# Patient Record
Sex: Female | Born: 1955
Health system: Southern US, Community
[De-identification: ages and names within clinical notes are randomized; demographics above are authoritative.]

## PROBLEM LIST (undated history)

## (undated) DIAGNOSIS — G839 Paralytic syndrome, unspecified: Secondary | ICD-10-CM

## (undated) DIAGNOSIS — I10 Essential (primary) hypertension: Secondary | ICD-10-CM

## (undated) DIAGNOSIS — M1990 Unspecified osteoarthritis, unspecified site: Secondary | ICD-10-CM

## (undated) DIAGNOSIS — L509 Urticaria, unspecified: Secondary | ICD-10-CM

## (undated) DIAGNOSIS — K219 Gastro-esophageal reflux disease without esophagitis: Secondary | ICD-10-CM

## (undated) HISTORY — DX: Unspecified osteoarthritis, unspecified site: M19.90

## (undated) HISTORY — DX: Essential (primary) hypertension: I10

## (undated) HISTORY — DX: Urticaria, unspecified: L50.9

## (undated) HISTORY — DX: Gastro-esophageal reflux disease without esophagitis: K21.9

## (undated) HISTORY — DX: Paralytic syndrome, unspecified: G83.9

---

## 1985-08-24 HISTORY — PX: ABDOMINAL HYSTERECTOMY: SHX81

## 1996-08-24 HISTORY — PX: OTHER SURGICAL HISTORY: SHX169

## 1998-11-06 ENCOUNTER — Ambulatory Visit (HOSPITAL_COMMUNITY): Admission: RE | Admit: 1998-11-06 | Discharge: 1998-11-06 | Payer: Self-pay | Admitting: Internal Medicine

## 1998-11-06 ENCOUNTER — Encounter: Payer: Self-pay | Admitting: Internal Medicine

## 1999-08-29 ENCOUNTER — Encounter: Admission: RE | Admit: 1999-08-29 | Discharge: 1999-08-29 | Payer: Self-pay | Admitting: Internal Medicine

## 1999-08-29 ENCOUNTER — Encounter: Payer: Self-pay | Admitting: Internal Medicine

## 1999-09-22 ENCOUNTER — Encounter: Admission: RE | Admit: 1999-09-22 | Discharge: 1999-09-22 | Payer: Self-pay | Admitting: Internal Medicine

## 1999-09-22 ENCOUNTER — Encounter: Payer: Self-pay | Admitting: Internal Medicine

## 1999-10-01 ENCOUNTER — Encounter: Admission: RE | Admit: 1999-10-01 | Discharge: 1999-10-01 | Payer: Self-pay | Admitting: Gynecology

## 1999-10-01 ENCOUNTER — Encounter: Payer: Self-pay | Admitting: Otolaryngology

## 1999-12-30 ENCOUNTER — Encounter: Payer: Self-pay | Admitting: Orthopedic Surgery

## 1999-12-30 ENCOUNTER — Ambulatory Visit (HOSPITAL_COMMUNITY): Admission: RE | Admit: 1999-12-30 | Discharge: 1999-12-30 | Payer: Self-pay | Admitting: Orthopedic Surgery

## 2000-02-17 ENCOUNTER — Encounter: Payer: Self-pay | Admitting: Orthopedic Surgery

## 2000-02-23 ENCOUNTER — Inpatient Hospital Stay (HOSPITAL_COMMUNITY): Admission: RE | Admit: 2000-02-23 | Discharge: 2000-02-24 | Payer: Self-pay | Admitting: Orthopedic Surgery

## 2000-02-23 ENCOUNTER — Encounter: Payer: Self-pay | Admitting: Orthopedic Surgery

## 2000-10-12 ENCOUNTER — Encounter: Payer: Self-pay | Admitting: Internal Medicine

## 2000-10-12 ENCOUNTER — Encounter: Admission: RE | Admit: 2000-10-12 | Discharge: 2000-10-12 | Payer: Self-pay | Admitting: Internal Medicine

## 2000-11-10 ENCOUNTER — Other Ambulatory Visit: Admission: RE | Admit: 2000-11-10 | Discharge: 2000-11-10 | Payer: Self-pay | Admitting: Internal Medicine

## 2001-08-26 ENCOUNTER — Observation Stay (HOSPITAL_COMMUNITY): Admission: RE | Admit: 2001-08-26 | Discharge: 2001-08-27 | Payer: Self-pay | Admitting: Obstetrics & Gynecology

## 2001-08-26 ENCOUNTER — Encounter (INDEPENDENT_AMBULATORY_CARE_PROVIDER_SITE_OTHER): Payer: Self-pay | Admitting: *Deleted

## 2001-08-26 ENCOUNTER — Encounter (INDEPENDENT_AMBULATORY_CARE_PROVIDER_SITE_OTHER): Payer: Self-pay

## 2001-10-13 ENCOUNTER — Encounter: Admission: RE | Admit: 2001-10-13 | Discharge: 2001-10-13 | Payer: Self-pay | Admitting: Internal Medicine

## 2001-10-13 ENCOUNTER — Encounter: Payer: Self-pay | Admitting: Internal Medicine

## 2002-06-12 ENCOUNTER — Encounter: Admission: RE | Admit: 2002-06-12 | Discharge: 2002-06-22 | Payer: Self-pay | Admitting: Orthopedic Surgery

## 2002-10-17 ENCOUNTER — Encounter: Payer: Self-pay | Admitting: Internal Medicine

## 2002-10-17 ENCOUNTER — Encounter: Admission: RE | Admit: 2002-10-17 | Discharge: 2002-10-17 | Payer: Self-pay | Admitting: Internal Medicine

## 2003-10-22 ENCOUNTER — Encounter: Admission: RE | Admit: 2003-10-22 | Discharge: 2003-10-22 | Payer: Self-pay | Admitting: Internal Medicine

## 2004-07-16 ENCOUNTER — Ambulatory Visit (HOSPITAL_COMMUNITY): Admission: RE | Admit: 2004-07-16 | Discharge: 2004-07-16 | Payer: Self-pay | Admitting: Ophthalmology

## 2004-07-21 ENCOUNTER — Ambulatory Visit: Payer: Self-pay | Admitting: Internal Medicine

## 2004-09-08 ENCOUNTER — Ambulatory Visit: Payer: Self-pay | Admitting: Internal Medicine

## 2004-11-10 ENCOUNTER — Ambulatory Visit: Payer: Self-pay | Admitting: Internal Medicine

## 2005-02-09 ENCOUNTER — Ambulatory Visit: Payer: Self-pay | Admitting: Internal Medicine

## 2005-02-10 ENCOUNTER — Ambulatory Visit: Payer: Self-pay | Admitting: Internal Medicine

## 2005-08-10 ENCOUNTER — Ambulatory Visit: Payer: Self-pay | Admitting: Internal Medicine

## 2005-11-09 ENCOUNTER — Ambulatory Visit: Payer: Self-pay | Admitting: Internal Medicine

## 2006-05-10 ENCOUNTER — Ambulatory Visit: Payer: Self-pay | Admitting: Internal Medicine

## 2006-06-10 ENCOUNTER — Ambulatory Visit: Payer: Self-pay | Admitting: Internal Medicine

## 2006-06-17 ENCOUNTER — Ambulatory Visit: Payer: Self-pay | Admitting: Internal Medicine

## 2006-08-02 ENCOUNTER — Ambulatory Visit: Payer: Self-pay | Admitting: Internal Medicine

## 2006-08-02 LAB — CONVERTED CEMR LAB
ALT: 40 units/L (ref 0–40)
AST: 29 units/L (ref 0–37)
Albumin: 3.7 g/dL (ref 3.5–5.2)
Alkaline Phosphatase: 110 units/L (ref 39–117)
BUN: 17 mg/dL (ref 6–23)
Bilirubin Urine: NEGATIVE
CO2: 31 meq/L (ref 19–32)
Calcium: 9.8 mg/dL (ref 8.4–10.5)
Chloride: 102 meq/L (ref 96–112)
Chol/HDL Ratio, serum: 5.1
Cholesterol: 192 mg/dL (ref 0–200)
Creatinine, Ser: 0.9 mg/dL (ref 0.4–1.2)
GFR calc non Af Amer: 70 mL/min
Glomerular Filtration Rate, Af Am: 85 mL/min/{1.73_m2}
Glucose, Bld: 125 mg/dL — ABNORMAL HIGH (ref 70–99)
HDL: 37.4 mg/dL — ABNORMAL LOW (ref >39.0)
Hemoglobin, Urine: NEGATIVE
Hemoglobin: 12.6 g/dL (ref 12.0–15.0)
Ketones, ur: NEGATIVE mg/dL
LDL Cholesterol: 139 mg/dL — ABNORMAL HIGH (ref 0–99)
Leukocytes, UA: NEGATIVE
Nitrite: NEGATIVE
Potassium: 3.9 meq/L (ref 3.5–5.1)
Sodium: 142 meq/L (ref 135–145)
Specific Gravity, Urine: >=1.03 (ref 1.000–1.03)
TSH: 2.43 microintl units/mL (ref 0.35–5.50)
Total Bilirubin: 0.6 mg/dL (ref 0.3–1.2)
Total Protein, Urine: NEGATIVE mg/dL
Total Protein: 7.4 g/dL (ref 6.0–8.3)
Triglyceride fasting, serum: 77 mg/dL (ref 0–149)
Urine Glucose: NEGATIVE mg/dL
Urobilinogen, UA: 0.2 (ref 0.0–1.0)
VLDL: 15 mg/dL (ref 0–40)
pH: 5 (ref 5.0–8.0)

## 2006-08-09 ENCOUNTER — Ambulatory Visit: Payer: Self-pay | Admitting: Internal Medicine

## 2006-11-08 ENCOUNTER — Ambulatory Visit: Payer: Self-pay | Admitting: Internal Medicine

## 2007-02-28 ENCOUNTER — Ambulatory Visit: Payer: Self-pay | Admitting: Internal Medicine

## 2007-02-28 LAB — CONVERTED CEMR LAB
BUN: 13 mg/dL (ref 6–23)
CO2: 33 meq/L — ABNORMAL HIGH (ref 19–32)
Calcium: 9.7 mg/dL (ref 8.4–10.5)
Chloride: 108 meq/L (ref 96–112)
Creatinine, Ser: 0.8 mg/dL (ref 0.4–1.2)
GFR calc Af Amer: 97 mL/min
GFR calc non Af Amer: 80 mL/min
Glucose, Bld: 131 mg/dL — ABNORMAL HIGH (ref 70–99)
Hgb A1c MFr Bld: 7.1 % — ABNORMAL HIGH (ref 4.6–6.0)
Potassium: 3.8 meq/L (ref 3.5–5.1)
Sodium: 144 meq/L (ref 135–145)
Vit D, 1,25-Dihydroxy: 35 (ref 20–57)

## 2007-03-07 ENCOUNTER — Ambulatory Visit: Payer: Self-pay | Admitting: Internal Medicine

## 2007-06-15 ENCOUNTER — Encounter: Admission: RE | Admit: 2007-06-15 | Discharge: 2007-06-15 | Payer: Self-pay | Admitting: Sports Medicine

## 2007-06-20 ENCOUNTER — Encounter: Payer: Self-pay | Admitting: Internal Medicine

## 2007-06-20 DIAGNOSIS — E119 Type 2 diabetes mellitus without complications: Secondary | ICD-10-CM | POA: Insufficient documentation

## 2007-07-01 ENCOUNTER — Encounter: Payer: Self-pay | Admitting: Internal Medicine

## 2007-07-04 ENCOUNTER — Ambulatory Visit: Payer: Self-pay | Admitting: Internal Medicine

## 2007-07-04 LAB — CONVERTED CEMR LAB
BUN: 6 mg/dL
CO2: 30 meq/L
Calcium: 9.7 mg/dL
Chloride: 104 meq/L
Creatinine, Ser: 0.7 mg/dL
GFR calc Af Amer: 113 mL/min
GFR calc non Af Amer: 93 mL/min
Glucose, Bld: 112 mg/dL — ABNORMAL HIGH
Hgb A1c MFr Bld: 6.8 % — ABNORMAL HIGH
Potassium: 3.9 meq/L
Sodium: 142 meq/L

## 2007-07-13 ENCOUNTER — Ambulatory Visit: Payer: Self-pay | Admitting: Internal Medicine

## 2007-07-13 DIAGNOSIS — G4700 Insomnia, unspecified: Secondary | ICD-10-CM | POA: Insufficient documentation

## 2007-07-13 DIAGNOSIS — I10 Essential (primary) hypertension: Secondary | ICD-10-CM | POA: Insufficient documentation

## 2007-07-13 DIAGNOSIS — M25519 Pain in unspecified shoulder: Secondary | ICD-10-CM

## 2007-08-25 DIAGNOSIS — M1990 Unspecified osteoarthritis, unspecified site: Secondary | ICD-10-CM

## 2007-08-25 HISTORY — DX: Unspecified osteoarthritis, unspecified site: M19.90

## 2007-11-14 ENCOUNTER — Ambulatory Visit: Payer: Self-pay | Admitting: Internal Medicine

## 2007-11-14 LAB — CONVERTED CEMR LAB
ALT: 26 units/L (ref 0–35)
AST: 20 units/L (ref 0–37)
Albumin: 4.2 g/dL (ref 3.5–5.2)
Alkaline Phosphatase: 85 units/L (ref 39–117)
BUN: 12 mg/dL (ref 6–23)
Bilirubin, Direct: 0.1 mg/dL (ref 0.0–0.3)
CO2: 30 meq/L (ref 19–32)
Calcium: 9.7 mg/dL (ref 8.4–10.5)
Chloride: 105 meq/L (ref 96–112)
Cholesterol: 163 mg/dL (ref 0–200)
Creatinine, Ser: 0.7 mg/dL (ref 0.4–1.2)
GFR calc Af Amer: 113 mL/min
GFR calc non Af Amer: 93 mL/min
Glucose, Bld: 108 mg/dL — ABNORMAL HIGH (ref 70–99)
HDL: 33.4 mg/dL — ABNORMAL LOW (ref >39.0)
Hgb A1c MFr Bld: 6.6 % — ABNORMAL HIGH (ref 4.6–6.0)
LDL Cholesterol: 118 mg/dL — ABNORMAL HIGH (ref 0–99)
Potassium: 3.9 meq/L (ref 3.5–5.1)
Sodium: 143 meq/L (ref 135–145)
Total Bilirubin: 0.6 mg/dL (ref 0.3–1.2)
Total CHOL/HDL Ratio: 4.9
Total Protein: 7.5 g/dL (ref 6.0–8.3)
Triglycerides: 56 mg/dL (ref 0–149)
VLDL: 11 mg/dL (ref 0–40)

## 2007-11-23 ENCOUNTER — Ambulatory Visit: Payer: Self-pay | Admitting: Internal Medicine

## 2007-11-23 DIAGNOSIS — M79609 Pain in unspecified limb: Secondary | ICD-10-CM | POA: Insufficient documentation

## 2008-03-19 ENCOUNTER — Ambulatory Visit: Payer: Self-pay | Admitting: Internal Medicine

## 2008-03-20 LAB — CONVERTED CEMR LAB
BUN: 7 mg/dL (ref 6–23)
CO2: 31 meq/L (ref 19–32)
Calcium: 9.6 mg/dL (ref 8.4–10.5)
Chloride: 103 meq/L (ref 96–112)
Creatinine, Ser: 0.7 mg/dL (ref 0.4–1.2)
GFR calc Af Amer: 113 mL/min
GFR calc non Af Amer: 93 mL/min
Glucose, Bld: 120 mg/dL — ABNORMAL HIGH (ref 70–99)
Hgb A1c MFr Bld: 6.9 % — ABNORMAL HIGH (ref 4.6–6.0)
Potassium: 4.3 meq/L (ref 3.5–5.1)
Sodium: 145 meq/L (ref 135–145)

## 2008-03-27 ENCOUNTER — Ambulatory Visit: Payer: Self-pay | Admitting: Internal Medicine

## 2008-03-27 DIAGNOSIS — M1990 Unspecified osteoarthritis, unspecified site: Secondary | ICD-10-CM | POA: Insufficient documentation

## 2008-07-16 ENCOUNTER — Ambulatory Visit: Payer: Self-pay | Admitting: Internal Medicine

## 2008-07-16 LAB — CONVERTED CEMR LAB
BUN: 7 mg/dL (ref 6–23)
CO2: 29 meq/L (ref 19–32)
Calcium: 9.6 mg/dL (ref 8.4–10.5)
Chloride: 103 meq/L (ref 96–112)
Creatinine, Ser: 0.6 mg/dL (ref 0.4–1.2)
GFR calc Af Amer: 134 mL/min
GFR calc non Af Amer: 111 mL/min
Glucose, Bld: 98 mg/dL (ref 70–99)
Hgb A1c MFr Bld: 6.8 % — ABNORMAL HIGH (ref 4.6–6.0)
Potassium: 4.1 meq/L (ref 3.5–5.1)
Sodium: 142 meq/L (ref 135–145)

## 2008-07-26 ENCOUNTER — Ambulatory Visit: Payer: Self-pay | Admitting: Internal Medicine

## 2009-01-11 ENCOUNTER — Ambulatory Visit: Payer: Self-pay | Admitting: Internal Medicine

## 2009-01-11 LAB — CONVERTED CEMR LAB
BUN: 10 mg/dL (ref 6–23)
CO2: 30 meq/L (ref 19–32)
Calcium: 9.8 mg/dL (ref 8.4–10.5)
Chloride: 109 meq/L (ref 96–112)
Creatinine, Ser: 0.7 mg/dL (ref 0.4–1.2)
GFR calc non Af Amer: 111.98 mL/min (ref >60)
Glucose, Bld: 99 mg/dL (ref 70–99)
Hgb A1c MFr Bld: 6.7 % — ABNORMAL HIGH (ref 4.6–6.5)
Potassium: 4.3 meq/L (ref 3.5–5.1)
Sodium: 146 meq/L — ABNORMAL HIGH (ref 135–145)

## 2009-01-24 ENCOUNTER — Ambulatory Visit: Payer: Self-pay | Admitting: Internal Medicine

## 2009-01-24 DIAGNOSIS — J039 Acute tonsillitis, unspecified: Secondary | ICD-10-CM

## 2009-01-24 DIAGNOSIS — M542 Cervicalgia: Secondary | ICD-10-CM

## 2009-03-12 ENCOUNTER — Encounter: Admission: RE | Admit: 2009-03-12 | Discharge: 2009-03-12 | Payer: Self-pay | Admitting: Internal Medicine

## 2009-05-20 ENCOUNTER — Ambulatory Visit: Payer: Self-pay | Admitting: Internal Medicine

## 2009-05-20 LAB — CONVERTED CEMR LAB
BUN: 8 mg/dL (ref 6–23)
CO2: 30 meq/L (ref 19–32)
Calcium: 9.5 mg/dL (ref 8.4–10.5)
Chloride: 107 meq/L (ref 96–112)
Creatinine, Ser: 0.6 mg/dL (ref 0.4–1.2)
GFR calc non Af Amer: 133.6 mL/min (ref >60)
Glucose, Bld: 94 mg/dL (ref 70–99)
Hgb A1c MFr Bld: 6.7 % — ABNORMAL HIGH (ref 4.6–6.5)
Potassium: 4 meq/L (ref 3.5–5.1)
Sodium: 143 meq/L (ref 135–145)

## 2009-05-28 ENCOUNTER — Ambulatory Visit: Payer: Self-pay | Admitting: Internal Medicine

## 2009-11-18 ENCOUNTER — Ambulatory Visit: Payer: Self-pay | Admitting: Internal Medicine

## 2009-11-18 LAB — CONVERTED CEMR LAB
ALT: 27 units/L (ref 0–35)
AST: 23 units/L (ref 0–37)
Albumin: 4.1 g/dL (ref 3.5–5.2)
Alkaline Phosphatase: 107 units/L (ref 39–117)
BUN: 7 mg/dL (ref 6–23)
Bilirubin, Direct: 0.1 mg/dL (ref 0.0–0.3)
CO2: 31 meq/L (ref 19–32)
Calcium: 9.7 mg/dL (ref 8.4–10.5)
Chloride: 104 meq/L (ref 96–112)
Cholesterol: 174 mg/dL (ref 0–200)
Creatinine, Ser: 0.7 mg/dL (ref 0.4–1.2)
GFR calc non Af Amer: 111.63 mL/min (ref >60)
Glucose, Bld: 92 mg/dL (ref 70–99)
HDL: 39.2 mg/dL (ref >39.00)
Hgb A1c MFr Bld: 6.6 % — ABNORMAL HIGH (ref 4.6–6.5)
LDL Cholesterol: 113 mg/dL — ABNORMAL HIGH (ref 0–99)
Potassium: 4.4 meq/L (ref 3.5–5.1)
Sodium: 143 meq/L (ref 135–145)
TSH: 1.48 microintl units/mL (ref 0.35–5.50)
Total Bilirubin: 0.4 mg/dL (ref 0.3–1.2)
Total CHOL/HDL Ratio: 4
Total Protein: 8.4 g/dL — ABNORMAL HIGH (ref 6.0–8.3)
Triglycerides: 111 mg/dL (ref 0.0–149.0)
VLDL: 22.2 mg/dL (ref 0.0–40.0)

## 2009-11-28 ENCOUNTER — Ambulatory Visit: Payer: Self-pay | Admitting: Internal Medicine

## 2009-11-28 DIAGNOSIS — J069 Acute upper respiratory infection, unspecified: Secondary | ICD-10-CM | POA: Insufficient documentation

## 2009-11-28 DIAGNOSIS — R209 Unspecified disturbances of skin sensation: Secondary | ICD-10-CM

## 2009-11-28 HISTORY — DX: Acute upper respiratory infection, unspecified: J06.9

## 2010-02-17 ENCOUNTER — Ambulatory Visit: Payer: Self-pay | Admitting: Internal Medicine

## 2010-02-17 LAB — CONVERTED CEMR LAB
BUN: 15 mg/dL (ref 6–23)
CO2: 29 meq/L (ref 19–32)
Calcium: 9.8 mg/dL (ref 8.4–10.5)
Chloride: 105 meq/L (ref 96–112)
Creatinine, Ser: 0.7 mg/dL (ref 0.4–1.2)
GFR calc non Af Amer: 104.6 mL/min (ref >60)
Glucose, Bld: 91 mg/dL (ref 70–99)
Hgb A1c MFr Bld: 6.8 % — ABNORMAL HIGH (ref 4.6–6.5)
Potassium: 5.1 meq/L (ref 3.5–5.1)
Sodium: 142 meq/L (ref 135–145)
Vit D, 25-Hydroxy: 46 ng/mL (ref 30–89)
Vitamin B-12: 614 pg/mL (ref 211–911)

## 2010-02-27 ENCOUNTER — Ambulatory Visit: Payer: Self-pay | Admitting: Internal Medicine

## 2010-03-14 ENCOUNTER — Encounter: Admission: RE | Admit: 2010-03-14 | Discharge: 2010-03-14 | Payer: Self-pay | Admitting: Internal Medicine

## 2010-03-14 LAB — HM MAMMOGRAPHY

## 2010-07-08 ENCOUNTER — Ambulatory Visit: Payer: Self-pay | Admitting: Internal Medicine

## 2010-07-09 LAB — CONVERTED CEMR LAB
ALT: 32 units/L (ref 0–35)
AST: 24 units/L (ref 0–37)
Albumin: 4.3 g/dL (ref 3.5–5.2)
Alkaline Phosphatase: 105 units/L (ref 39–117)
BUN: 12 mg/dL (ref 6–23)
Basophils Absolute: 0.1 10*3/uL (ref 0.0–0.1)
Basophils Relative: 1 % (ref 0.0–3.0)
Bilirubin Urine: NEGATIVE
Bilirubin, Direct: 0 mg/dL (ref 0.0–0.3)
CO2: 30 meq/L (ref 19–32)
Calcium: 9.8 mg/dL (ref 8.4–10.5)
Chloride: 103 meq/L (ref 96–112)
Cholesterol: 170 mg/dL (ref 0–200)
Creatinine, Ser: 0.8 mg/dL (ref 0.4–1.2)
Eosinophils Absolute: 0.2 10*3/uL (ref 0.0–0.7)
Eosinophils Relative: 2.9 % (ref 0.0–5.0)
GFR calc non Af Amer: 90.24 mL/min (ref >60)
Glucose, Bld: 98 mg/dL (ref 70–99)
HCT: 39.4 % (ref 36.0–46.0)
HDL: 41.4 mg/dL (ref >39.00)
Hemoglobin, Urine: NEGATIVE
Hemoglobin: 13.1 g/dL (ref 12.0–15.0)
Hgb A1c MFr Bld: 6.8 % — ABNORMAL HIGH (ref 4.6–6.5)
Ketones, ur: NEGATIVE mg/dL
LDL Cholesterol: 117 mg/dL — ABNORMAL HIGH (ref 0–99)
Leukocytes, UA: NEGATIVE
Lymphocytes Relative: 53.2 % — ABNORMAL HIGH (ref 12.0–46.0)
Lymphs Abs: 3.2 10*3/uL (ref 0.7–4.0)
MCHC: 33.4 g/dL (ref 30.0–36.0)
MCV: 90.3 fL (ref 78.0–100.0)
Monocytes Absolute: 0.4 10*3/uL (ref 0.1–1.0)
Monocytes Relative: 7.2 % (ref 3.0–12.0)
Neutro Abs: 2.1 10*3/uL (ref 1.4–7.7)
Neutrophils Relative %: 35.7 % — ABNORMAL LOW (ref 43.0–77.0)
Nitrite: NEGATIVE
Platelets: 323 10*3/uL (ref 150.0–400.0)
Potassium: 5.3 meq/L — ABNORMAL HIGH (ref 3.5–5.1)
RBC: 4.36 M/uL (ref 3.87–5.11)
RDW: 13.9 % (ref 11.5–14.6)
Sodium: 140 meq/L (ref 135–145)
Specific Gravity, Urine: <=1.005 (ref 1.000–1.030)
TSH: 1.04 microintl units/mL (ref 0.35–5.50)
Total Bilirubin: 0.5 mg/dL (ref 0.3–1.2)
Total CHOL/HDL Ratio: 4
Total Protein, Urine: NEGATIVE mg/dL
Total Protein: 7.3 g/dL (ref 6.0–8.3)
Triglycerides: 60 mg/dL (ref 0.0–149.0)
Urine Glucose: NEGATIVE mg/dL
Urobilinogen, UA: 0.2 (ref 0.0–1.0)
VLDL: 12 mg/dL (ref 0.0–40.0)
WBC: 6 10*3/uL (ref 4.5–10.5)
pH: 6.5 (ref 5.0–8.0)

## 2010-07-16 ENCOUNTER — Ambulatory Visit: Payer: Self-pay | Admitting: Internal Medicine

## 2010-07-16 DIAGNOSIS — K219 Gastro-esophageal reflux disease without esophagitis: Secondary | ICD-10-CM | POA: Insufficient documentation

## 2010-07-16 DIAGNOSIS — R071 Chest pain on breathing: Secondary | ICD-10-CM

## 2010-07-16 DIAGNOSIS — M25569 Pain in unspecified knee: Secondary | ICD-10-CM

## 2010-09-23 NOTE — Assessment & Plan Note (Signed)
Summary: 3 MTH FU STC   Vital Signs:  Patient profile:   55 year old female Height:      58 inches (147.32 cm) Weight:      149 pounds (67.73 kg) BMI:     31.25 O2 Sat:      98 % on Room air Temp:     98.6 degrees F (37.00 degrees C) oral Pulse rate:   80 / minute Pulse rhythm:   regular Resp:     16 per minute BP sitting:   130 / 90  (left arm)  Vitals Entered By: Lanier Prude, CMA(AAMA) (February 27, 2010 8:13 AM)  O2 Flow:  Room air CC: 3 mo f/u Is Patient Diabetic? Yes Did you bring your meter with you today? No   CC:  3 mo f/u.  History of Present Illness: The patient presents for a follow up of hypertension, diabetes, hyperlipidemia   Preventive Screening-Counseling & Management  Alcohol-Tobacco     Smoking Status: quit  Current Medications (verified): 1)  Norvasc 5 Mg  Tabs (Amlodipine Besylate) .... Once Daily 2)  Metformin Hcl 500 Mg  Tabs (Metformin Hcl) .... Once Daily 3)  Vitamin D3 1000 Unit  Tabs (Cholecalciferol) .[name].. 1 Qd 4)  Ibuprofen 600 Mg  Tabs (Ibuprofen) .[name].. 1 By Mouth Two Times A Day X 5 D Then As Needed Pain 5)  Zolpidem Tartrate 10 Mg Tabs (Zolpidem Tartrate) .... 1/2-1 Tab At Bedtime As Needed Insomnia 6)  Ranitidine Hcl 150 Mg Tabs (Ranitidine Hcl) .[name].. 1 By Mouth Two Times A Day For Indigestion  Allergies (verified): 1)  ! Maxzide-25 (Triamterene-Hctz) 2)  ! Atenolol (Atenolol) 3)  ! 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    Physical  Exam  General:  Well-developed,well-nourished,in no acute distress; alert,appropriate and cooperative throughout examination Mouth:  Erythematous throat mucosa and intranasal erythema.  Neck:  Cervical spine is tender to palpation over L paraspinal muscles and with the ROM  Lungs:  Normal respiratory effort, chest expands symmetrically. Lungs are clear to auscultation, no crackles or wheezes. Heart:  Normal rate and regular rhythm. S1 and S2 normal without gallop, murmur, click, rub or other extra sounds. Abdomen:  Bowel sounds positive,abdomen soft and non-tender without masses, organomegaly or hernias noted. Msk:  as above Neurologic:  No cranial nerve deficits noted. Station and gait are normal. Plantar reflexes are down-going bilaterally. DTRs are symmetrical throughout. Sensory, motor and coordinative functions appear intact. Tinnel's neg B Skin:  Intact without suspicious lesions or rashes Psych:  Cognition and judgment appear intact. Alert and cooperative with normal attention span and concentration. No apparent delusions, illusions, hallucinations   Impression & Recommendations:  Problem # 1:  DIABETES MELLITUS, TYPE II (ICD-250.00) Assessment Improved  Her updated medication list for this problem includes:    Metformin Hcl 500 Mg Tabs (Metformin hcl) ..... Once daily  Labs Reviewed: Creat: 0.7 (02/17/2010)    Reviewed HgBA1c results: 6.8 (02/17/2010)  6.6 (11/18/2009)  Problem # 2:  OSTEOARTHRITIS (ICD-715.90) Assessment: Unchanged  Her  updated medication list for this problem includes:    Ibuprofen 600 Mg Tabs (Ibuprofen) .[name]... 1 by mouth two times a day x 5 d then as needed pain  Problem # 3:  HYPERTENSION (ICD-401.1) Assessment: Unchanged  Her updated medication list for this problem includes:    Norvasc 5 Mg Tabs (Amlodipine besylate) ..... Once daily  Complete Medication List: 1)  Norvasc 5 Mg Tabs (Amlodipine besylate) .... Once daily 2)  Metformin Hcl 500 Mg  Tabs (Metformin hcl) .... Once daily 3)  Vitamin D3 1000 Unit Tabs (Cholecalciferol) .[name].. 1 qd 4)  Ibuprofen 600 Mg Tabs (Ibuprofen) .[name].. 1 by mouth two times a day x 5 d then as needed pain 5)  Zolpidem Tartrate 10 Mg Tabs (Zolpidem tartrate) .... 1/2-1 tab at bedtime as needed insomnia 6)  Ranitidine Hcl 150 Mg Tabs (Ranitidine hcl) .[name].. 1 by mouth two times a day for indigestion  Patient Instructions: 1)  Please schedule a follow-up appointment in 4 months well w/labs v70.0. 2)  HbgA1C prior to visit, ICD-9:250.00

## 2010-09-23 NOTE — Assessment & Plan Note (Signed)
Summary: 6 MO ROV /NWS  #   Vital Signs:  Patient profile:   55 year old female Height:      58 inches (147.32 cm) Weight:      151.0 pounds (68.64 kg) BMI:     31.67 O2 Sat:      96 % on Room air Temp:     98.1 degrees F (36.72 degrees C) oral Pulse rate:   86 / minute BP sitting:   110 / 82  (left arm) Cuff size:   regular  Vitals Entered By: Orlan Leavens (November 28, 2009 9:52 AM)  O2 Flow:  Room air CC: 6 month follow-up Is Patient Diabetic? Yes Did you bring your meter with you today? No Pain Assessment Patient in pain? no        CC:  6 month follow-up.  History of Present Illness: The patient presents with complaints of sore throat, fever, cough, sinus congestion and drainge of several days duration. Not better with OTC meds. Chest hurts with coughing. Can't sleep due to cough. Muscle aches are present.  The mucus is colored. C/o R arms w/pins and needles; legs cramp; not sleeping well  Current Medications (verified): 1)  Norvasc 5 Mg  Tabs (Amlodipine Besylate) .... Once Daily 2)  Metformin Hcl 500 Mg  Tabs (Metformin Hcl) .... Once Daily 3)  Vitamin D3 1000 Unit  Tabs (Cholecalciferol) .[name].. 1 Qd 4)  Ibuprofen 600 Mg  Tabs (Ibuprofen) .[name].. 1 By Mouth Two Times A Day X 5 D Then As Needed Pain  Allergies (verified): 1)  ! Maxzide-25 (Triamterene-Hctz) 2)  ! Atenolol (Atenolol) 3)  ! 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    Physical Exam  General:  Well-developed,well-nourished,in no acute distress; alert,appropriate and cooperative throughout examination Mouth:  Erythematous throat mucosa and intranasal  erythema.  Lungs:  Normal respiratory effort, chest expands symmetrically. Lungs are clear to auscultation, no crackles or wheezes. Heart:  Normal rate and regular rhythm. S1 and S2 normal without gallop, murmur, click, rub or other extra sounds. Abdomen:  Bowel sounds positive,abdomen soft and non-tender without masses, organomegaly or hernias noted. Msk:  as above Pulses:  R and L carotid,radial,femoral,dorsalis pedis and posterior tibial pulses are full and equal bilaterally Extremities:  No clubbing, cyanosis, edema, or deformity noted with normal full range of motion of all joints.   Neurologic:  No cranial nerve deficits noted. Station and gait are normal. Plantar reflexes are down-going bilaterally. DTRs are symmetrical throughout. Sensory, motor and coordinative functions appear intact. Tinnel's neg B Skin:  Intact without suspicious lesions or rashes Psych:  Cognition and judgment appear intact. Alert and cooperative with normal attention span and concentration. No apparent delusions, illusions, hallucinations   Impression & Recommendations:  Problem # 1:  DIABETES MELLITUS, TYPE II (ICD-250.00) Assessment Improved  Her updated medication list for this problem includes:    Metformin Hcl 500 Mg Tabs (Metformin hcl) ..... Once daily  Problem # 2:  INSOMNIA, PERSISTENT (ICD-307.42) Assessment: Deteriorated  Problem # 3:  OSTEOARTHRITIS (ICD-715.90)  The following medications were removed from the medication list:    Darvocet-n 100 100-650 Mg Tabs (Propoxyphene n-apap) .[name]... 1 by mouth two  times a day as needed pain Her updated medication list for this problem includes:    Ibuprofen 600 Mg Tabs (Ibuprofen) .[name]... 1 by mouth two times a day x 5 d then as needed pain  Discussed use of medications, application of heat or cold, and exercises.   Problem # 4:  HYPERTENSION (ICD-401.1) Assessment: Improved  Her updated medication list for this problem includes:    Norvasc 5 Mg Tabs  (Amlodipine besylate) ..... Once daily  BP today: 110/82 Prior BP: 126/84 (05/28/2009)  Labs Reviewed: K+: 4.4 (11/18/2009) Creat: : 0.7 (11/18/2009)   Chol: 174 (11/18/2009)   HDL: 39.20 (11/18/2009)   LDL: 113 (11/18/2009)   TG: 111.0 (11/18/2009)  Problem # 5:  UPPER RESPIRATORY INFECTION, ACUTE (ICD-465.9) Assessment: New  Her updated medication list for this problem includes:    Ibuprofen 600 Mg Tabs (Ibuprofen) .[name]... 1 by mouth two times a day x 5 d then as needed pain Zpac if worse  Problem # 6:  PARESTHESIA (ICD-782.0) Assessment: New The labs were reviewed with the patient.   Treat DM. Get more  labs  Complete Medication List: 1)  Norvasc 5 Mg Tabs (Amlodipine besylate) .... Once daily 2)  Metformin Hcl 500 Mg Tabs (Metformin hcl) .... Once daily 3)  Vitamin D3 1000 Unit Tabs (Cholecalciferol) .[name].. 1 qd 4)  Ibuprofen 600 Mg Tabs (Ibuprofen) .[name].. 1 by mouth two times a day x 5 d then as needed pain 5)  Zolpidem Tartrate 10 Mg Tabs (Zolpidem tartrate) .... 1/2-1 tab at bedtime as needed insomnia 6)  Ranitidine Hcl 150 Mg Tabs (Ranitidine hcl) .[name].. 1 by mouth two times a day for indigestion 7)  Zithromax Z-pak 250 Mg Tabs (Azithromycin) .... As dirrected  Patient Instructions: 1)  Contour pillow 2)  Use stretching and balance exercises that I have provided (15 min. or longer every day) 3)  Please schedule a follow-up appointment in 3 months. 4)  BMP prior to visit, ICD-9: 5)  HbgA1C prior to visit, ICD-9: 6)  Vit B12 782.0 995.20 7)  Vit D Prescriptions: ZITHROMAX Z-PAK 250 MG TABS (AZITHROMYCIN) as dirrected  #1 x 0   Entered and Authorized by:   Tresa Garter MD   Signed by:   Tresa Garter MD on 11/28/2009   Method used:   Print then Give to Patient   RxID:   [id number] RANITIDINE HCL 150 MG TABS (RANITIDINE HCL) 1 by mouth two times a day for indigestion  #60 x 12   Entered and Authorized by:   Tresa Garter MD   Signed by:   Tresa Garter MD on 11/28/2009   Method used:   Print then Give to Patient   RxID:   [id number] ZOLPIDEM TARTRATE 10 MG TABS (ZOLPIDEM TARTRATE) 1/2-1 tab at bedtime as needed insomnia  #30 x 6   Entered and Authorized by:   Tresa Garter MD   Signed by:   Tresa Garter MD on 11/28/2009   Method used:   Print then Give to Patient   RxID:   [id number]

## 2010-09-23 NOTE — Assessment & Plan Note (Signed)
Summary: 4 MO ROV /NWS #   Vital Signs:  Patient profile:   55 year old female Height:      58 inches Weight:      139 pounds BMI:     29.16 Temp:     98.9 degrees F oral Pulse rate:   76 / minute Pulse rhythm:   regular Resp:     16 per minute BP sitting:   110 / 72  (left arm) Cuff size:   regular  Vitals Entered By: Lanier Prude, CMA(AAMA) (July 16, 2010 7:55 AM) CC: 4 mo f/u c/o tingling in Lt hand, chest sore, Rt knee pain due to fall on Fri Is Patient Diabetic? Yes Comments pt is not taking Ranitidine   CC:  4 mo f/u c/o tingling in Lt hand, chest sore, and Rt knee pain due to fall on Fri.  History of Present Illness: C/o R knee pain after she fell C/o pain in L upper chest with movements Can't sleep on L side - hip pain C/o  throat discomfort x weeks  Current Medications (verified): 1)  Norvasc 5 Mg  Tabs (Amlodipine Besylate) .... Once Daily 2)  Metformin Hcl 500 Mg  Tabs (Metformin Hcl) .... Once Daily 3)  Vitamin D3 1000 Unit  Tabs (Cholecalciferol) .[name].. 1 Qd 4)  Ibuprofen 600 Mg  Tabs (Ibuprofen) .[name].. 1 By Mouth Two Times A Day X 5 D Then As Needed Pain 5)  Zolpidem Tartrate 10 Mg Tabs (Zolpidem Tartrate) .... 1/2-1 Tab At Bedtime As Needed Insomnia 6)  Ranitidine Hcl 150 Mg Tabs (Ranitidine Hcl) .[name].. 1 By Mouth Two Times A Day For Indigestion  Allergies (verified): 1)  ! Maxzide-25 (Triamterene-Hctz) 2)  ! Atenolol (Atenolol) 3)  ! 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  The patient denies fever, syncope, dyspnea on exertion, and abdominal pain.         Lost wt on wt watchers  Physical  Exam  General:  Well-developed,well-nourished,in no acute distress; alert,appropriate and cooperative throughout examination Nose:  External nasal examination shows no deformity or inflammation. Nasal mucosa are pink and moist without lesions or exudates. Mouth:  Erythematous throat mucosa and intranasal erythema.  Neck:  Cervical spine is tender to palpation over L paraspinal muscles and with the ROM  Lungs:  Normal respiratory effort, chest expands symmetrically. Lungs are clear to auscultation, no crackles or wheezes. Heart:  Normal rate and regular rhythm. S1 and S2 normal without gallop, murmur, click, rub or other extra sounds. Abdomen:  Bowel sounds positive,abdomen soft and non-tender without masses, organomegaly or hernias noted. Msk:  R knee w/crepitus and pain on extension Neurologic:  No cranial nerve deficits noted. Station and gait are normal. Plantar reflexes are down-going bilaterally. DTRs are symmetrical throughout. Sensory, motor and coordinative functions appear intact. Tinnel's neg B Skin:  Intact without suspicious lesions or rashes Psych:  Cognition and judgment appear intact. Alert and cooperative with normal attention span and concentration. No apparent delusions, illusions, hallucinations   Impression & Recommendations:  Problem # 1:  CHEST WALL PAIN, ACUTE (ZOX-096.04) MSK Assessment New  Her updated medication list for this problem includes:  Vimovo 500-20 Mg Tbec (Naproxen-esomeprazole) .[name]... 1 by mouth once daily - two times a day pc as needed pain    Ibuprofen 600 Mg Tabs (Ibuprofen) .[name]... 1 by mouth two times a day x 5 d then as needed pain  Problem # 2:  KNEE PAIN (ICD-719.46) R Assessment: New  Her updated medication list for this problem includes:    Vimovo 500-20 Mg Tbec (Naproxen-esomeprazole) .[name]... 1 by mouth once daily - two times a day pc as needed pain    Ibuprofen 600 Mg Tabs (Ibuprofen) .[name]... 1 by mouth two times a day x 5 d then as needed  pain  Problem # 3:  DIABETES MELLITUS, TYPE II (ICD-250.00) Assessment: Improved  Her updated medication list for this problem includes:    Metformin Hcl 500 Mg Tabs (Metformin hcl) ..... Once daily  Labs Reviewed: Creat: 0.8 (07/08/2010)    Reviewed HgBA1c results: 6.8 (07/08/2010)  6.8 (02/17/2010)  Problem # 4:  GERD (ICD-530.81) Assessment: Deteriorated     Ranitidine Hcl 150 Mg Tabs (Ranitidine hcl) .[name]... 1 by mouth two times a day for indigestion Risks of noncompliance with treatment discussed. Compliance encouraged.   Complete Medication List: 1)  Norvasc 5 Mg Tabs (Amlodipine besylate) .... Once daily 2)  Metformin Hcl 500 Mg Tabs (Metformin hcl) .... Once daily 3)  Vitamin D3 1000 Unit Tabs (Cholecalciferol) .[name].. 1 qd 4)  Zolpidem Tartrate 10 Mg Tabs (Zolpidem tartrate) .... 1/2-1 tab at bedtime as needed insomnia 5)  Vimovo 500-20 Mg Tbec (Naproxen-esomeprazole) .[name].. 1 by mouth once daily - two times a day pc as needed pain 6)  Ranitidine Hcl 150 Mg Tabs (Ranitidine hcl) .[name].. 1 by mouth two times a day for indigestion 7)  Ibuprofen 600 Mg Tabs (Ibuprofen) .[name].. 1 by mouth two times a day x 5 d then as needed pain  Patient Instructions: 1)  Take Vimovo OR Ranitidine plus Ibuprofen 2)  Use stretching and balance exercises that I have provided (15 min. or longer every day)  3)  Please schedule a follow-up appointment in 3 months. 4)  BMP prior to visit, ICD-9: 5)  HbgA1C prior to visit, ICD-9:250.00 Prescriptions: IBUPROFEN 600 MG  TABS (IBUPROFEN) 1 by mouth two times a day x 5 d then as needed pain  #60 x 3   Entered and Authorized by:   Tresa Garter MD   Signed by:   Tresa Garter MD on 07/16/2010   Method used:   Print then Give to Patient   RxID:   [id number] RANITIDINE HCL 150 MG TABS (RANITIDINE HCL) 1 by mouth two times a day for indigestion  #60 x 12   Entered and Authorized by:   Tresa Garter MD   Signed by:   Tresa Garter MD  on 07/16/2010   Method used:   Print then Give to Patient   RxID:   [id number] VIMOVO 500-20 MG TBEC (NAPROXEN-ESOMEPRAZOLE) 1 by mouth once daily - two times a day pc as needed pain  #60 x 3   Entered and Authorized by:   Tresa Garter MD   Signed by:   Tresa Garter MD on 07/16/2010   Method used:   Print then Give to Patient   RxID:   [id number]    Orders Added: 1)  Est. Patient Level IV [01027]

## 2010-10-10 ENCOUNTER — Other Ambulatory Visit: Payer: Self-pay

## 2010-10-13 ENCOUNTER — Other Ambulatory Visit: Payer: Self-pay | Admitting: Internal Medicine

## 2010-10-13 ENCOUNTER — Other Ambulatory Visit: Payer: 59

## 2010-10-13 ENCOUNTER — Encounter (INDEPENDENT_AMBULATORY_CARE_PROVIDER_SITE_OTHER): Payer: Self-pay | Admitting: *Deleted

## 2010-10-13 DIAGNOSIS — E119 Type 2 diabetes mellitus without complications: Secondary | ICD-10-CM

## 2010-10-13 LAB — BASIC METABOLIC PANEL
BUN: 9 mg/dL (ref 6–23)
Chloride: 105 mEq/L (ref 96–112)
Creatinine, Ser: 0.8 mg/dL (ref 0.4–1.2)
Glucose, Bld: 78 mg/dL (ref 70–99)
Sodium: 142 mEq/L (ref 135–145)

## 2010-10-13 LAB — HEMOGLOBIN A1C: Hgb A1c MFr Bld: 6.6 % — ABNORMAL HIGH (ref 4.6–6.5)

## 2010-10-17 ENCOUNTER — Ambulatory Visit (INDEPENDENT_AMBULATORY_CARE_PROVIDER_SITE_OTHER): Payer: 59 | Admitting: Internal Medicine

## 2010-10-17 ENCOUNTER — Encounter: Payer: Self-pay | Admitting: Internal Medicine

## 2010-10-17 DIAGNOSIS — R070 Pain in throat: Secondary | ICD-10-CM

## 2010-10-17 DIAGNOSIS — K219 Gastro-esophageal reflux disease without esophagitis: Secondary | ICD-10-CM

## 2010-10-17 DIAGNOSIS — R252 Cramp and spasm: Secondary | ICD-10-CM | POA: Insufficient documentation

## 2010-10-17 DIAGNOSIS — E119 Type 2 diabetes mellitus without complications: Secondary | ICD-10-CM

## 2010-10-23 ENCOUNTER — Encounter: Payer: Self-pay | Admitting: Internal Medicine

## 2010-10-30 NOTE — Assessment & Plan Note (Signed)
Summary: 3 MO FU/NWS #   Vital Signs:  Patient profile:   55 year old female Height:      58 inches Weight:      132 pounds BMI:     27.69 Temp:     98.8 degrees F oral Pulse rate:   80 / minute Pulse rhythm:   regular Resp:     16 per minute BP sitting:   110 / 80  (left arm) Cuff size:   regular  Vitals Entered By: Lanier Prude, CMA(AAMA) (October 17, 2010 9:25 AM) CC: 3 mo f/u  Is Patient Diabetic? Yes   CC:  3 mo f/u .  History of Present Illness: F/u HTN, DM, OA, GERD C/o leg cramps C/o L ST x 3 months off and on  Current Medications (verified): 1)  Norvasc 5 Mg  Tabs (Amlodipine Besylate) .... Once Daily 2)  Metformin Hcl 500 Mg  Tabs (Metformin Hcl) .... Once Daily 3)  Vitamin D3 1000 Unit  Tabs (Cholecalciferol) .[name].. 1 Qd 4)  Zolpidem Tartrate 10 Mg Tabs (Zolpidem Tartrate) .... 1/2-1 Tab At Bedtime As Needed Insomnia 5)  Vimovo 500-20 Mg Tbec (Naproxen-Esomeprazole) .[name].. 1 By Mouth Once Daily - Two Times A Day Pc As Needed Pain 6)  Ranitidine Hcl 150 Mg Tabs (Ranitidine Hcl) .[name].. 1 By Mouth Two Times A Day For Indigestion 7)  Ibuprofen 600 Mg  Tabs (Ibuprofen) .[name].. 1 By Mouth Two Times A Day X 5 D Then As Needed Pain  Allergies (verified): 1)  ! Maxzide-25 (Triamterene-Hctz) 2)  ! Atenolol (Atenolol) 3)  ! 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    Physical Exam  General:  Well-developed,well-nourished,in no acute distress; alert,appropriate and cooperative throughout examination Nose:  External nasal examination shows no deformity or inflammation.  Nasal mucosa are pink and moist without lesions or exudates. Mouth:  WNL Neck:  No deformities, masses, or tenderness noted. NT Lungs:  Normal respiratory effort, chest expands symmetrically. Lungs are clear to auscultation, no crackles or wheezes. Heart:  Normal rate and regular rhythm. S1 and S2 normal without gallop, murmur, click, rub or other extra sounds. Abdomen:  Bowel sounds positive,abdomen soft and non-tender without masses, organomegaly or hernias noted. Msk:  R knee w/crepitus and pain on extension Extremities:  No clubbing, cyanosis, edema, or deformity noted with normal full range of motion of all joints.   Neurologic:  No cranial nerve deficits noted. Station and gait are normal. Plantar reflexes are down-going bilaterally. DTRs are symmetrical throughout. Sensory, motor and coordinative functions appear intact. Tinnel's neg B Skin:  Intact without suspicious lesions or rashes Psych:  Cognition and judgment appear intact. Alert and cooperative with normal attention span and concentration. No apparent delusions, illusions, hallucinations   Impression & Recommendations:  Problem # 1:  DIABETES MELLITUS, TYPE II (ICD-250.00) Assessment Improved  Her updated medication list for this problem includes:    Metformin Hcl 500 Mg Tabs (Metformin hcl) ..... Once daily  Labs Reviewed: Creat: 0.8 (10/13/2010)    Reviewed HgBA1c results: 6.6 (10/13/2010)  6.8 (07/08/2010)  Problem # 2:  HYPERTENSION (ICD-401.1) Assessment: Improved  Her updated medication  list for this problem includes:    Norvasc 5 Mg Tabs (Amlodipine besylate) ..... Once daily  BP today: 110/80 Prior BP: 110/72 (07/16/2010)  Labs Reviewed: K+: 4.4 (10/13/2010) Creat: : 0.8 (10/13/2010)   Chol: 170 (07/08/2010)   HDL: 41.40 (07/08/2010)   LDL: 117 (07/08/2010)   TG: 60.0 (07/08/2010)  Problem # 3:  OSTEOARTHRITIS (ICD-715.90) Assessment: Improved  Her updated medication list for this problem includes:     Vimovo 500-20 Mg Tbec (Naproxen-esomeprazole) .[name]... 1 by mouth once daily - two times a day pc as needed pain    Ibuprofen 600 Mg Tabs (Ibuprofen) .[name]... 1 by mouth two times a day x 5 d then as needed pain  Problem # 4:  CRAMP OF LIMB (ICD-729.82) Assessment: New  Problem # 5:  THROAT PAIN, SEVERE (ICD-784.1) L Assessment: New  Orders: ENT Referral (ENT)  Complete Medication List: 1)  Norvasc 5 Mg Tabs (Amlodipine besylate) .... Once daily 2)  Metformin Hcl 500 Mg Tabs (Metformin hcl) .... Once daily 3)  Vitamin D3 1000 Unit Tabs (Cholecalciferol) .[name].. 1 qd 4)  Zolpidem Tartrate 10 Mg Tabs (Zolpidem tartrate) .... 1/2-1 tab at bedtime as needed insomnia 5)  Vimovo 500-20 Mg Tbec (Naproxen-esomeprazole) .[name].. 1 by mouth once daily - two times a day pc as needed pain 6)  Ranitidine Hcl 150 Mg Tabs (Ranitidine hcl) .[name].. 1 by mouth two times a day for indigestion 7)  Ibuprofen 600 Mg Tabs (Ibuprofen) .[name].. 1 by mouth two times a day x 5 d then as needed pain  Patient Instructions: 1)  Please schedule a follow-up appointment in 4-5  months well w/labs and A1c 250.00.   Orders Added: 1)  ENT Referral [ENT] 2)  Est. Patient Level IV [04540]

## 2010-11-11 NOTE — Consult Note (Signed)
Summary: [facility]  [facility]   Imported By: Sherian Rein 11/05/2010 12:21:12  _____________________________________________________________________  External Attachment:    Type:   Image     Comment:   External Document

## 2011-01-09 NOTE — Op Note (Signed)
[facility]  Patient:    Renee Mann, Renee Mann                         MRN: [id number] Proc. Date: 02/23/00 Adm. Date:  [id number] Attending:  Ollen Gross V                           Operative Report  PREOPERATIVE DIAGNOSIS:  Right foot subtalar arthrosis.  POSTOPERATIVE DIAGNOSIS:  Right foot subtalar arthrosis.  PROCEDURE:  Right subtalar fusion utilizing allograft bone.  SURGEON:  Ollen Gross, M.D.  ASSISTANT:  Druscilla Brownie. Shela Nevin, P.A.  ANESTHESIA:  General.  ESTIMATED BLOOD LOSS:  Minimal.  DRAINS:  None.  TOURNIQUET TIME:  60 minutes at 350 mmHg.  COMPLICATIONS:  None.  DISPOSITION:  Stable to recovery.  BRIEF CLINICAL NOTE:  Renee Mann is a 55 year old female with a long history of significant right foot pain.  She has minimal subtalar motion, and an MRI demonstrated significant subtalar arthrosis.  She had a subtalar injection, which partially relieved her pain.  She presents now for a subtalar fusion.  DESCRIPTION OF PROCEDURE:  After successful administration of general anesthetic, a tourniquet was placed on her right thigh and the right lower extremity prepped and draped in the usual sterile fashion.  The extremity was wrapped in an Esmarch, and the tourniquet inflated to 350 mmHg.  An incision line was then drawn from the tip of the fibula toward the fourth metatarsal base.  An incision was made with a 15 blade through subcutaneous tissue.  The fat in the sinus tarsi was subperiosteally elevated so as to expose the subtalar joint.  The joint was extremely tight and there was a large osteophyte at the lateral margin of the inferior talus.  This osteophyte was removed.  Curettes were used to help remove some of the remaining cartilage, and then a laminar spreader was placed into the joint.  The cartilage was removed all the way to the medial margin, and then the FHL tendon was identified, noting that we were over far enough medially.   The posterior facet was completely cleaned off from both the talus and calcaneus.  Once it was felt that all the cartilage was removed, then a small osteotome was used to feather the bone so as to expose the cancellous surface and then allow for bleeding bone.  Once this was completed throughout the entire course of the subtalar joint, the cancellous allograft and Grafton were packed in so as to completely fill the defect.  At this point, a small incision was made at the heel and guidewire for a 6.5 Ace cannulated screw was driven under fluoroscopic guidance through the calcaneus, up into the talar neck.  The depth gauge was used to measure a 70 mm screw.  The 70 mm, 6.5 screw was then placed on the guide pin.  This led to excellent compression across the subtalar joint and good position of the metal hardware.  Multiple views were taken, AP, oblique, and lateral, and I was very satisfied with the position of the hardware as well as the position of the foot.  She was fused in approximately 5 degrees of valgus at the hindfoot.  At this point, the screw was completely tightened and the wound irrigated.  The lateral incision was closed with interrupted 3-0 Vicryl for subcutaneous tissue and then 4-0 nylon for skin.  The small poke incision posteriorly was  closed with nylon. Tourniquet then released at a total time of 60 minutes.  Bulky sterile dressing was applied, and she was placed into a posterior splint, awakened, and transported to recovery in stable condition. DD:  02/23/00 TD:  02/23/00 Job: [id number]

## 2011-01-09 NOTE — Op Note (Signed)
[facility]  Patient:    Renee Mann, Renee Mann Visit Number: [id number] MRN: [id number]          Service Type: OBV Location: [id number] Attending Physician:  Lars Pinks Dictated by:   Caralyn Guile Arlyce Dice, M.D. Proc. Date: 08/26/01 Admit Date:  08/26/2001                             Operative Report  PREOPERATIVE DIAGNOSES:       Right adnexal mass.  POSTOPERATIVE DIAGNOSES:      Right adnexal mass.  PROCEDURE:                    Laparoscopy with right salpingo-oophorectomy and lysis of adhesions.  SURGEON:                      Richard D. Arlyce Dice, M.D.  ASSISTANT:                    Janeece Riggers. Dareen Piano, M.D.  ANESTHESIA:                   General endotracheal.  ESTIMATED BLOOD LOSS:         20 cc.  FINDINGS:                     The right ovary was enlarged with a 4-5 cm fibroma which was firm and solid.  There were adhesions to the anterior abdominal wall from omentum.  There were adhesions on the left sigmoid colon and bowel into the left pelvic side wall and cul-de-sac.  The left ovary could not be visualized.  INDICATIONS:                  This is a 55 year old female who was noted to have ovarian mass on pelvic examination which was confirmed by ultrasound approximately one to two years ago.  The patient had been followed and the mass has maintained its size, has not regressed, and decision was made to remove it to rule out neoplasia.  PROCEDURE:                    The patient was brought to the operating room and placed in the dorsal lithotomy position after general endotracheal anesthesia had been induced.  The abdomen, perineum, and vagina were prepped and draped in a sterile fashion and the bladder was catheterized and emptied. The base of the umbilicus was exposed and an incision was made.  A Veress needle was introduced into the peritoneal cavity and pneumoperitoneum was created.  A 5 mm laparoscope was introduced into the  peritoneum and the pelvis was viewed with the findings noted above.  Accessory instruments were placed in the right and left lower quadrants.  The adhesions to the anterior abdominal wall were lysed.  Blood vessels were cauterized.  After the pelvis was freed the right ovary was grasped and lifted, the infundibulopelvic ligaments isolated, and the ureter was identified well below the infundibulopelvic ligament.  Using a tripolar cauterizing and cutting instrument the infundibulopelvic ligament was then progressively cauterized and cut freeing up the right tube and ovary.  This was then placed in the cul-de-sac.  Attention was then turned to the left adnexa.  Bowel adhesions were noted and lysis of these adhesions were taken down.  Adhesions that were thick were cauterized and then cut.  This dissection continued for approximately 30 minutes at which time it was clear that the density of the bowel adhesions and the proximity of the adhesions to the pelvic side wall were such that further dissection could jeopardize bowel injury or injury to the ureter.  During this whole time there was no area where the ovary could be visualized.  Because of the difficulty in this aspect of the case and because the left ovary had no pathology on ultrasound, decision was made that the risks of bowel or ureteral injury and to continue the search removal of the left adnexa outweighed the benefit of ovarian cancer prophylaxis in this patient and the attempt for a left salpingo-oophorectomy was abandoned.  The right adnexa was then regrasped in the cul-de-sac and placed in an endo pouch through an 18 mm separate port in the midline.  Despite the extra large port, the ovarian mass clearly was too big and solid to come through.  The fascia was cut transversely and the skin incision was extended.  Because the mass was so solid it was difficult even under these circumstances to remove it.  The bag was pulled through  the skin and opened externally and using the bag as an externalizing pouch the ovary was morcellated.  This procedure was carried on until the ovary finally was delivered through the abdominal wall.  Following this the fascia was closed with running Vicryl run suture.  The laparoscope was then reintroduced into the peritoneal cavity and all the sites were irrigated and evaluated for hemostasis and this was present.  Procedure was then terminated and the gas was allowed to escape.  The umbilical incision was closed with a single interrupted Vicryl run suture.  The left and right lower quadrant 5 mm probe sites were closed with Steri-Strips.  The skin over the midline incision was closed with a subcuticular 4-0 Dexon suture.  The patient tolerated procedure well and left the operating room in good condition. Dictated by:   Caralyn Guile Arlyce Dice, M.D. Attending Physician:  Lars Pinks DD:  08/26/01 TD:  08/26/01 Job: [id number]

## 2011-01-09 NOTE — Assessment & Plan Note (Signed)
[facility]                           PRIMARY CARE OFFICE NOTE   Renee Mann, Renee Mann                         MRN:          [id number]  DATE:08/09/2006                            DOB:          05-12-1956    PROCEDURE:  Left shoulder injection/left AC joint.   INDICATION:  Refractory pain.   Risks including unsuccessful procedure, bleeding, infection and others  as well as benefits explained to the patient in detail. She agreed to  proceed.   The area was prepped with Betadine and alcohol. The most painful area  was identified, 20 mg of Depo-Medrol and 2 cc of 2% lidocaine with  epinephrine was injected in usual fashion. Tolerated well.   COMPLICATIONS:  None.   Good pain relief immediately following the procedure.     Georgina Quint. Plotnikov, MD  Electronically Signed    AVP/MedQ  DD: 08/10/2006  DT: 08/10/2006  Job #: [id number]

## 2011-01-09 NOTE — H&P (Signed)
[facility]  Patient:    Renee Mann, Renee Mann                        MRN: [id number] Adm. Date:  02/23/00 Attending:  Ollen Gross, M.D. Dictator:   Druscilla Brownie. Shela Nevin, P.A.                         History and Physical  DATE OF BIRTH:  12-18-1955  CHIEF COMPLAINT:  "Pain in my right ankle."  HISTORY OF PRESENT ILLNESS:  This 55 year old female who has been seen by [place] for continuing problems concerning her ankle.  We have seen her for this continuing problem.  She had been treated with an ASO, but with little help. She has also been seen in physical therapy which really has not helped it. 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  We injected the ankle and this did not help, as well.  It is felt that due to the fact we have just nearly exhausted the conservative care that the subtalar degenerative changes have gone to the point where she really has no way to turn.  It is felt at this point in time a fusion would be indicated and she is scheduled for a right subtalar fusion utilizing cancellus and Grafton allografting.  She is aware of the complications and the postop rehab that will be necessary.  PAST MEDICAL HISTORY:  She has had repair of the rotator cuff in 1995, hysterectomy in 1986, and hip surgery in the past.  MEDICATIONS:  Her only medications are Claritin 10 mg tabs 1 q.d.  ALLERGIES:  She is allergic to CODEINE.  FAMILY PHYSICIAN:  Dr. Sonda Primes, M.D., who has treated her for blood pressure in the past.  He has kindly referred her for this problem above.  SOCIAL HISTORY:  The patient is married.  Neither smokes nor drinks.  FAMILY HISTORY:  Noncontributory.  REVIEW OF SYSTEMS:  CNS:  No seizure disorder, paralysis, numbness, or double vision.  Respiratory:  No productive cough.  No hemoptysis.  No shortness of breath.  Cardiovascular:  No chest pain.  No angina.  No orthopnea. Gastrointestinal:   No nausea, vomiting, melena, or bloody stools. Genitourinary:  No discharge, dysuria, or hematuria.  Musculoskeletal: Primarily in present illness with her right ankle and foot.  PHYSICAL EXAMINATION:  GENERAL:  Alert, cooperative, friendly, 55 year old, black female.  VITAL SIGNS:  Blood pressure 148/80, pulse 82, respirations 12.  HEENT:  Normocephalic.  PERRLA.  Oropharynx is clear.  CHEST:  Clear to auscultation.  No rhonchi.  No rales.  HEART:  Regular rate and rhythm.  No murmurs were heard.  ABDOMEN:  Soft and nontender.  Liver and spleen not felt.  GENITALIA, RECTAL, BREASTS, PELVIC:  Not done.  Not pertinent to present illness.  EXTREMITIES:  Examination of her right foot shows diminution of inversion and eversion to the ankle, which increases pain.  The majority of her pain and discomfort is anterolateral.  ADMITTING DIAGNOSES:  1. Right subtalar arthrosis.  2. History of hypertension.  PLAN:  The patient will undergo right ankle subtalar fusion.  This will be an overnight evaluation. DD:  02/16/00 TD:  02/17/00 Job: [id number]

## 2011-02-13 ENCOUNTER — Other Ambulatory Visit (INDEPENDENT_AMBULATORY_CARE_PROVIDER_SITE_OTHER): Payer: 59

## 2011-02-13 ENCOUNTER — Other Ambulatory Visit: Payer: Self-pay | Admitting: Internal Medicine

## 2011-02-13 DIAGNOSIS — E119 Type 2 diabetes mellitus without complications: Secondary | ICD-10-CM

## 2011-02-13 DIAGNOSIS — Z0000 Encounter for general adult medical examination without abnormal findings: Secondary | ICD-10-CM

## 2011-02-13 LAB — BASIC METABOLIC PANEL
BUN: 11 mg/dL (ref 6–23)
CO2: 29 mEq/L (ref 19–32)
Calcium: 9.6 mg/dL (ref 8.4–10.5)
Creatinine, Ser: 0.6 mg/dL (ref 0.4–1.2)
GFR: 130.25 mL/min (ref >60.00)
Glucose, Bld: 83 mg/dL (ref 70–99)
Potassium: 4.7 mEq/L (ref 3.5–5.1)
Sodium: 141 mEq/L (ref 135–145)

## 2011-02-13 LAB — HEPATIC FUNCTION PANEL
Alkaline Phosphatase: 96 U/L (ref 39–117)
Bilirubin, Direct: 0.1 mg/dL (ref 0.0–0.3)
Total Protein: 7.1 g/dL (ref 6.0–8.3)

## 2011-02-13 LAB — URINALYSIS
Bilirubin Urine: NEGATIVE
Hgb urine dipstick: NEGATIVE
Ketones, ur: NEGATIVE
Leukocytes, UA: NEGATIVE
Nitrite: NEGATIVE
Specific Gravity, Urine: 1.01 (ref 1.000–1.030)
Total Protein, Urine: NEGATIVE
Urobilinogen, UA: 0.2 (ref 0.0–1.0)
pH: 6.5 (ref 5.0–8.0)

## 2011-02-13 LAB — CBC WITH DIFFERENTIAL/PLATELET
Basophils Relative: 0.8 % (ref 0.0–3.0)
Eosinophils Absolute: 0.1 10*3/uL (ref 0.0–0.7)
Lymphs Abs: 3.1 10*3/uL (ref 0.7–4.0)
MCHC: 33.3 g/dL (ref 30.0–36.0)
MCV: 91.9 fl (ref 78.0–100.0)
Monocytes Absolute: 0.4 10*3/uL (ref 0.1–1.0)
Neutro Abs: 2 10*3/uL (ref 1.4–7.7)
Neutrophils Relative %: 34.9 % — ABNORMAL LOW (ref 43.0–77.0)
RBC: 4.23 Mil/uL (ref 3.87–5.11)

## 2011-02-13 LAB — LIPID PANEL
Cholesterol: 180 mg/dL (ref 0–200)
HDL: 45.8 mg/dL (ref >39.00)
Total CHOL/HDL Ratio: 4
Triglycerides: 74 mg/dL (ref 0.0–149.0)

## 2011-02-18 ENCOUNTER — Encounter: Payer: Self-pay | Admitting: Internal Medicine

## 2011-02-19 ENCOUNTER — Encounter: Payer: Self-pay | Admitting: Internal Medicine

## 2011-02-20 ENCOUNTER — Ambulatory Visit (INDEPENDENT_AMBULATORY_CARE_PROVIDER_SITE_OTHER): Payer: 59 | Admitting: Internal Medicine

## 2011-02-20 ENCOUNTER — Encounter: Payer: Self-pay | Admitting: Internal Medicine

## 2011-02-20 VITALS — BP 118/70 | HR 72 | Temp 98.4°F | Resp 16 | Ht 59.0 in | Wt 127.0 lb

## 2011-02-20 DIAGNOSIS — J019 Acute sinusitis, unspecified: Secondary | ICD-10-CM

## 2011-02-20 DIAGNOSIS — E119 Type 2 diabetes mellitus without complications: Secondary | ICD-10-CM

## 2011-02-20 DIAGNOSIS — M25562 Pain in left knee: Secondary | ICD-10-CM

## 2011-02-20 DIAGNOSIS — M25569 Pain in unspecified knee: Secondary | ICD-10-CM

## 2011-02-20 DIAGNOSIS — Z0000 Encounter for general adult medical examination without abnormal findings: Secondary | ICD-10-CM

## 2011-02-20 DIAGNOSIS — M25561 Pain in right knee: Secondary | ICD-10-CM | POA: Insufficient documentation

## 2011-02-20 MED ORDER — AMOXICILLIN 500 MG PO CAPS: 1000.0000 mg | ORAL_CAPSULE | Freq: Two times a day (BID) | ORAL | Status: AC

## 2011-02-20 MED ORDER — MELOXICAM 15 MG PO TABS: 15.0000 mg | ORAL_TABLET | Freq: Every day | ORAL | Status: DC

## 2011-02-20 NOTE — Assessment & Plan Note (Signed)
See meds Cort inj if not better

## 2011-02-20 NOTE — Assessment & Plan Note (Addendum)
We discussed age appropriate health related issues, including available/recomended screening tests and vaccinations. We discussed a need for adhering to healthy diet and exercise. Labs/EKG were reviewed/ordered. All questions were answered. Doing well on Wt watchers x 1 year.

## 2011-02-20 NOTE — Progress Notes (Signed)
Subjective:    Patient ID: Renee Mann, female    DOB: Dec 04, 1955, 55 y.o.   MRN: [id number]  HPI  The patient is here for a wellness exam. The patient has been doing well overall without major physical or psychological issues going on lately. The patient needs to address  chronic hypertension that has been well controlled with medicines; to address chronic  hyperlipidemia controlled with medicines as well; and to address type 2 chronic diabetes, controlled with medical treatment and diet.  Review of Systems  Constitutional: Negative for fever, chills, diaphoresis, activity change, appetite change, fatigue and unexpected weight change.  HENT: Negative for hearing loss, ear pain, congestion, sore throat, sneezing, mouth sores, neck pain, dental problem, voice change, postnasal drip and sinus pressure.   Eyes: Negative for pain and visual disturbance.  Respiratory: Negative for cough, chest tightness, wheezing and stridor.   Cardiovascular: Negative for chest pain, palpitations and leg swelling.  Gastrointestinal: Negative for nausea, vomiting, abdominal pain, blood in stool, abdominal distention and rectal pain.  Genitourinary: Negative for dysuria, hematuria, decreased urine volume, vaginal bleeding, vaginal discharge, difficulty urinating, vaginal pain and menstrual problem.  Musculoskeletal: Positive for arthralgias (knees hurt). Negative for back pain, joint swelling and gait problem.  Skin: Negative for color change, rash and wound.  Neurological: Negative for dizziness, tremors, syncope, speech difficulty and light-headedness.  Hematological: Negative for adenopathy.  Psychiatric/Behavioral: Negative for suicidal ideas, hallucinations, behavioral problems, confusion, sleep disturbance, dysphoric mood and decreased concentration. The patient is not hyperactive.        Objective:   Physical Exam  Constitutional: She appears well-developed and well-nourished. No distress.  HENT:    Head: Normocephalic.  Right Ear: External ear normal.  Left Ear: External ear normal.  Nose: Nose normal.  Mouth/Throat: Oropharynx is clear and moist.  Eyes: Conjunctivae are normal. Pupils are equal, round, and reactive to light. Right eye exhibits no discharge. Left eye exhibits no discharge.  Neck: Normal range of motion. Neck supple. No JVD present. No tracheal deviation present. No thyromegaly present.  Cardiovascular: Normal rate, regular rhythm and normal heart sounds.   Pulmonary/Chest: No stridor. No respiratory distress. She has no wheezes.  Abdominal: Soft. Bowel sounds are normal. She exhibits no distension and no mass. There is no tenderness. There is no rebound and no guarding.  Musculoskeletal: She exhibits tenderness (B Bancerina and B med collat ligs are tender on knees). She exhibits no edema.  Lymphadenopathy:    She has no cervical adenopathy.  Neurological: She displays normal reflexes. No cranial nerve deficit. She exhibits normal muscle tone. Coordination normal.  Skin: No rash noted. No erythema.  Psychiatric: She has a normal mood and affect. Her behavior is normal. Judgment and thought content normal.   Wt Readings from Last 3 Encounters:  02/20/11 127 lb (57.607 kg)  10/17/10 132 lb (59.875 kg)  07/16/10 139 lb (63.05 kg)   Lab Results  Component Value Date   WBC 5.6 02/13/2011   HGB 13.0 02/13/2011   HCT 38.9 02/13/2011   PLT 291.0 02/13/2011   CHOL 180 02/13/2011   TRIG 74.0 02/13/2011   HDL 45.80 02/13/2011   ALT 42* 02/13/2011   AST 28 02/13/2011   NA 141 02/13/2011   K 4.7 02/13/2011   CL 105 02/13/2011   CREATININE 0.6 02/13/2011   BUN 11 02/13/2011   CO2 29 02/13/2011   TSH 1.32 02/13/2011   HGBA1C 6.7* 02/13/2011          Assessment &  Plan:

## 2011-04-01 ENCOUNTER — Other Ambulatory Visit: Payer: Self-pay | Admitting: Internal Medicine

## 2011-06-15 ENCOUNTER — Other Ambulatory Visit: Payer: Self-pay | Admitting: Internal Medicine

## 2011-08-03 ENCOUNTER — Ambulatory Visit (INDEPENDENT_AMBULATORY_CARE_PROVIDER_SITE_OTHER): Payer: 59 | Admitting: Endocrinology

## 2011-08-03 ENCOUNTER — Encounter: Payer: Self-pay | Admitting: Endocrinology

## 2011-08-03 VITALS — BP 144/84 | HR 137 | Temp 102.7°F

## 2011-08-03 DIAGNOSIS — J069 Acute upper respiratory infection, unspecified: Secondary | ICD-10-CM

## 2011-08-03 MED ORDER — AZITHROMYCIN 500 MG PO TABS: 500.0000 mg | ORAL_TABLET | Freq: Every day | ORAL | Status: AC

## 2011-08-03 MED ORDER — PROMETHAZINE-DM 6.25-15 MG/5ML PO SYRP: 5.0000 mL | ORAL_SOLUTION | Freq: Four times a day (QID) | ORAL | Status: AC | PRN

## 2011-08-03 NOTE — Patient Instructions (Addendum)
i have sent prescriptions to your pharmacy, for an antibiotic and cough syrup. Take tylenol as needed for fever, and drink plenty of fluids.   I hope you feel better soon.  If you don't feel better by next week, please call dr plotnikov.

## 2011-08-03 NOTE — Progress Notes (Signed)
  Subjective:    Patient ID: Renee Mann, female    DOB: 09/29/1955, 55 y.o.   MRN: [id number]  HPI Pt states few days of dry-quality cough in the chest, and assoc sore throat.   Past Medical History  Diagnosis Date  . Diabetes mellitus 2008    type II  . Hives   . 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 Hypertension   . Osteoarthritis   . Gastroesophageal reflux disease     Past Surgical History  Procedure Date  . Abdominal hysterectomy   . Burritis surgery 1998    L. hip    History   Social History  . Marital Status: Married    Spouse Name: N/A    Number of Children: N/A  . Years of Education: N/A   Occupational History  . 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 Smoking status: Former Smoker    Quit date: 08/24/1998  . Smokeless tobacco: Not on file  . Alcohol Use: No  . Drug Use: No  . Sexually Active: Yes   Other Topics Concern  . Not on file   Social History Narrative  . No narrative on file    Current Outpatient Prescriptions on File Prior to Visit  Medication Sig Dispense Refill  . amLODipine (NORVASC) 5 MG tablet TAKE ONE TABLET BY MOUTH DAILY  30 tablet  5  . Cholecalciferol (VITAMIN D3) 1000 UNITS tablet Take 1,000 Units by mouth daily.        . meloxicam (MOBIC) 15 MG tablet TAKE ONE TABLET BY MOUTH EVERY DAY  90 tablet  1  . metFORMIN (GLUCOPHAGE) 500 MG tablet TAKE 1 TABLET BY MOUTH DAILY  30 tablet  5  . metFORMIN (GLUMETZA) 500 MG (MOD) 24 hr tablet Take 500 mg by mouth daily.        . ranitidine (ZANTAC) 150 MG tablet Take 150 mg by mouth 2 (two) times daily.        [name] zolpidem (AMBIEN) 10 MG tablet Take 5-10 mg by mouth at bedtime as needed.          Allergies  Allergen Reactions  . Atenolol     REACTION: WEIRD FEELINGS  . Codeine Sulfate   . Hydrochlorothiazide W/Triamterene     REACTION: H/A    Family History  Problem Relation Age of Onset  . Hypertension    . Hypertension Mother   . Hypertension  Sister     BP 144/84  Pulse 137  Temp(Src) 102.7 F (39.3 C) (Oral)  SpO2 95%    Review of Systems She has fever and headache.      Objective:   Physical Exam VITAL SIGNS:  See vs page GENERAL: no distress head: no deformity eyes: no periorbital swelling, no proptosis external nose and ears are normal mouth: no lesion seen Ears: both tm's are slightly red NECK: There is no palpable thyroid enlargement.  No thyroid nodule is palpable.  No palpable lymphadenopathy at the anterior neck. LUNGS:  Clear to auscultation       Assessment & Plan:  URI, new

## 2011-08-14 ENCOUNTER — Other Ambulatory Visit (INDEPENDENT_AMBULATORY_CARE_PROVIDER_SITE_OTHER): Payer: 59

## 2011-08-14 DIAGNOSIS — E119 Type 2 diabetes mellitus without complications: Secondary | ICD-10-CM

## 2011-08-14 DIAGNOSIS — M25569 Pain in unspecified knee: Secondary | ICD-10-CM

## 2011-08-14 DIAGNOSIS — M25561 Pain in right knee: Secondary | ICD-10-CM

## 2011-08-14 LAB — COMPREHENSIVE METABOLIC PANEL
AST: 19 U/L (ref 0–37)
Albumin: 4.1 g/dL (ref 3.5–5.2)
BUN: 12 mg/dL (ref 6–23)
CO2: 27 mEq/L (ref 19–32)
Calcium: 9.7 mg/dL (ref 8.4–10.5)
Chloride: 106 mEq/L (ref 96–112)
Creatinine, Ser: 0.7 mg/dL (ref 0.4–1.2)
GFR: 104.04 mL/min (ref >60.00)
Glucose, Bld: 85 mg/dL (ref 70–99)
Potassium: 4.3 mEq/L (ref 3.5–5.1)
Total Protein: 7.6 g/dL (ref 6.0–8.3)

## 2011-08-21 ENCOUNTER — Other Ambulatory Visit: Payer: Self-pay | Admitting: Internal Medicine

## 2011-08-24 ENCOUNTER — Encounter: Payer: Self-pay | Admitting: Internal Medicine

## 2011-08-24 ENCOUNTER — Ambulatory Visit (INDEPENDENT_AMBULATORY_CARE_PROVIDER_SITE_OTHER): Payer: 59 | Admitting: Internal Medicine

## 2011-08-24 VITALS — BP 110/88 | HR 80 | Temp 98.5°F | Resp 16 | Wt 132.0 lb

## 2011-08-24 DIAGNOSIS — M1990 Unspecified osteoarthritis, unspecified site: Secondary | ICD-10-CM

## 2011-08-24 DIAGNOSIS — M25569 Pain in unspecified knee: Secondary | ICD-10-CM

## 2011-08-24 DIAGNOSIS — M25561 Pain in right knee: Secondary | ICD-10-CM

## 2011-08-24 DIAGNOSIS — E119 Type 2 diabetes mellitus without complications: Secondary | ICD-10-CM

## 2011-08-24 DIAGNOSIS — I10 Essential (primary) hypertension: Secondary | ICD-10-CM

## 2011-08-24 MED ORDER — DICLOFENAC SODIUM 1 % TD GEL: 1.0000 "application " | Freq: Four times a day (QID) | TRANSDERMAL | Status: DC

## 2011-08-24 MED ORDER — MELOXICAM 15 MG PO TABS: 15.0000 mg | ORAL_TABLET | Freq: Every day | ORAL | Status: DC | PRN

## 2011-08-24 NOTE — Progress Notes (Signed)
Patient ID: Renee Mann, female   DOB: Jun 19, 1956, 55 y.o.   MRN: [id number]  Subjective:    Patient ID: Renee Mann, female    DOB: Jul 19, 1956, 55 y.o.   MRN: [id number]  HPI  The patient is here for a wellness exam. The patient has been doing well overall without major physical or psychological issues going on lately. The patient needs to address  chronic hypertension that has been well controlled with medicines; to address chronic  hyperlipidemia controlled with medicines as well; and to address type 2 chronic diabetes, controlled with medical treatment and diet.  Review of Systems  Constitutional: Negative for fever, chills, diaphoresis, activity change, appetite change, fatigue and unexpected weight change.  HENT: Negative for hearing loss, ear pain, congestion, sore throat, sneezing, mouth sores, neck pain, dental problem, voice change, postnasal drip and sinus pressure.   Eyes: Negative for pain and visual disturbance.  Respiratory: Negative for cough, chest tightness, wheezing and stridor.   Cardiovascular: Negative for chest pain, palpitations and leg swelling.  Gastrointestinal: Negative for nausea, vomiting, abdominal pain, blood in stool, abdominal distention and rectal pain.  Genitourinary: Negative for dysuria, hematuria, decreased urine volume, vaginal bleeding, vaginal discharge, difficulty urinating, vaginal pain and menstrual problem.  Musculoskeletal: Positive for arthralgias (knees hurt). Negative for back pain, joint swelling and gait problem.  Skin: Negative for color change, rash and wound.  Neurological: Negative for dizziness, tremors, syncope, speech difficulty and light-headedness.  Hematological: Negative for adenopathy.  Psychiatric/Behavioral: Negative for suicidal ideas, hallucinations, behavioral problems, confusion, sleep disturbance, dysphoric mood and decreased concentration. The patient is not hyperactive.        Objective:   Physical Exam    Constitutional: She appears well-developed and well-nourished. No distress.  HENT:  Head: Normocephalic.  Right Ear: External ear normal.  Left Ear: External ear normal.  Nose: Nose normal.  Mouth/Throat: Oropharynx is clear and moist.  Eyes: Conjunctivae are normal. Pupils are equal, round, and reactive to light. Right eye exhibits no discharge. Left eye exhibits no discharge.  Neck: Normal range of motion. Neck supple. No JVD present. No tracheal deviation present. No thyromegaly present.  Cardiovascular: Normal rate, regular rhythm and normal heart sounds.   Pulmonary/Chest: No stridor. No respiratory distress. She has no wheezes.  Abdominal: Soft. Bowel sounds are normal. She exhibits no distension and no mass. There is no tenderness. There is no rebound and no guarding.  Musculoskeletal: She exhibits tenderness (B Bancerina and B med collat ligs are tender on knees). She exhibits no edema.  Lymphadenopathy:    She has no cervical adenopathy.  Neurological: She displays normal reflexes. No cranial nerve deficit. She exhibits normal muscle tone. Coordination normal.  Skin: No rash noted. No erythema.  Psychiatric: She has a normal mood and affect. Her behavior is normal. Judgment and thought content normal.    Lab Results  Component Value Date   WBC 5.6 02/13/2011   HGB 13.0 02/13/2011   HCT 38.9 02/13/2011   PLT 291.0 02/13/2011   GLUCOSE 85 08/14/2011   CHOL 180 02/13/2011   TRIG 74.0 02/13/2011   HDL 45.80 02/13/2011   LDLCALC 119* 02/13/2011   ALT 28 08/14/2011   AST 19 08/14/2011   NA 143 08/14/2011   K 4.3 08/14/2011   CL 106 08/14/2011   CREATININE 0.7 08/14/2011   BUN 12 08/14/2011   CO2 27 08/14/2011   TSH 1.32 02/13/2011   HGBA1C 6.6* 08/14/2011  Assessment & Plan:

## 2011-08-24 NOTE — Assessment & Plan Note (Signed)
Continue with current prescription therapy as reflected on the Med list.  

## 2011-08-24 NOTE — Assessment & Plan Note (Signed)
2012 B ancerina and B med collat ligs

## 2011-10-16 ENCOUNTER — Other Ambulatory Visit: Payer: Self-pay | Admitting: Internal Medicine

## 2011-11-27 ENCOUNTER — Ambulatory Visit (INDEPENDENT_AMBULATORY_CARE_PROVIDER_SITE_OTHER): Payer: 59 | Admitting: Family Medicine

## 2011-11-27 ENCOUNTER — Encounter: Payer: Self-pay | Admitting: Internal Medicine

## 2011-11-27 VITALS — BP 167/79 | HR 76 | Temp 98.3°F | Resp 16 | Ht <= 58 in | Wt 140.0 lb

## 2011-11-27 DIAGNOSIS — S339XXA Sprain of unspecified parts of lumbar spine and pelvis, initial encounter: Secondary | ICD-10-CM

## 2011-11-27 DIAGNOSIS — M5440 Lumbago with sciatica, unspecified side: Secondary | ICD-10-CM

## 2011-11-27 MED ORDER — TRAMADOL HCL 50 MG PO TABS: 50.0000 mg | ORAL_TABLET | Freq: Three times a day (TID) | ORAL | Status: AC | PRN

## 2011-11-27 MED ORDER — METHYLPREDNISOLONE 4 MG PO TABS: ORAL_TABLET | ORAL | Status: DC

## 2011-11-27 NOTE — Patient Instructions (Signed)
Back Pain, Adult Low back pain is very common. About 1 in 5 people have back pain.The cause of low back pain is rarely dangerous. The pain often gets better over time.About half of people with a sudden onset of back pain feel better in just 2 weeks. About 8 in 10 people feel better by 6 weeks.  CAUSES Some common causes of back pain include:  Strain of the muscles or ligaments supporting the spine.   Wear and tear (degeneration) of the spinal discs.   Arthritis.   Direct injury to the back.  DIAGNOSIS Most of the time, the direct cause of low back pain is not known.However, back pain can be treated effectively even when the exact cause of the pain is unknown.Answering your caregiver's questions about your overall health and symptoms is one of the most accurate ways to make sure the cause of your pain is not dangerous. If your caregiver needs more information, he or she may order lab work or imaging tests (X-rays or MRIs).However, even if imaging tests show changes in your back, this usually does not require surgery. HOME CARE INSTRUCTIONS For many people, back pain returns.Since low back pain is rarely dangerous, it is often a condition that people can learn to manageon their own.   Remain active. It is stressful on the back to sit or stand in one place. Do not sit, drive, or stand in one place for more than 30 minutes at a time. Take short walks on level surfaces as soon as pain allows.Try to increase the length of time you walk each day.   Do not stay in bed.Resting more than 1 or 2 days can delay your recovery.   Do not avoid exercise or work.Your body is made to move.It is not dangerous to be active, even though your back may hurt.Your back will likely heal faster if you return to being active before your pain is gone.   Pay attention to your body when you bend and lift. Many people have less discomfortwhen lifting if they bend their knees, keep the load close to their  bodies,and avoid twisting. Often, the most comfortable positions are those that put less stress on your recovering back.   Find a comfortable position to sleep. Use a firm mattress and lie on your side with your knees slightly bent. If you lie on your back, put a pillow under your knees.   Only take over-the-counter or prescription medicines as directed by your caregiver. Over-the-counter medicines to reduce pain and inflammation are often the most helpful.Your caregiver may prescribe muscle relaxant drugs.These medicines help dull your pain so you can more quickly return to your normal activities and healthy exercise.   Put ice on the injured area.   Put ice in a plastic bag.   Place a towel between your skin and the bag.   Leave the ice on for 15 to 20 minutes, 3 to 4 times a day for the first 2 to 3 days. After that, ice and heat may be alternated to reduce pain and spasms.   Ask your caregiver about trying back exercises and gentle massage. This may be of some benefit.   Avoid feeling anxious or stressed.Stress increases muscle tension and can worsen back pain.It is important to recognize when you are anxious or stressed and learn ways to manage it.Exercise is a great option.  SEEK MEDICAL CARE IF:  You have pain that is not relieved with rest or medicine.   You have   pain that does not improve in 1 week.   You have new symptoms.   You are generally not feeling well.  SEEK IMMEDIATE MEDICAL CARE IF:   You have pain that radiates from your back into your legs.   You develop new bowel or bladder control problems.   You have unusual weakness or numbness in your arms or legs.   You develop nausea or vomiting.   You develop abdominal pain.   You feel faint.  Document Released: 08/10/2005 Document Revised: 07/30/2011 Document Reviewed: 12/29/2010 [employer] Patient Information 2012 [employer]. 

## 2011-11-27 NOTE — Progress Notes (Signed)
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  Could not get back up without assistance.  Pain runs down right leg.  No problems passing water or going to the bathroom.  Occasionally feels like right leg will give out on her.  O:  In obvious acute pain SLR:  Pain on right at 30 degrees No back or CvA tenderness Pain with right knee crossover testing Reflexes 0/ AJ and KJ bilaterally No upgoing toes Good pulses bilaterally without edema  A:  Acute lumbar disc disease  P:  Medrol 4 mg 3-2-2-1-1, tramadol for pain

## 2011-12-11 ENCOUNTER — Other Ambulatory Visit (INDEPENDENT_AMBULATORY_CARE_PROVIDER_SITE_OTHER): Payer: 59

## 2011-12-11 DIAGNOSIS — M25569 Pain in unspecified knee: Secondary | ICD-10-CM

## 2011-12-11 DIAGNOSIS — E119 Type 2 diabetes mellitus without complications: Secondary | ICD-10-CM

## 2011-12-11 DIAGNOSIS — I10 Essential (primary) hypertension: Secondary | ICD-10-CM

## 2011-12-11 DIAGNOSIS — M1990 Unspecified osteoarthritis, unspecified site: Secondary | ICD-10-CM

## 2011-12-11 DIAGNOSIS — M25562 Pain in left knee: Secondary | ICD-10-CM

## 2011-12-11 LAB — BASIC METABOLIC PANEL
BUN: 10 mg/dL (ref 6–23)
Chloride: 106 mEq/L (ref 96–112)
GFR: 140.44 mL/min (ref >60.00)
Potassium: 4 mEq/L (ref 3.5–5.1)
Sodium: 141 mEq/L (ref 135–145)

## 2011-12-11 LAB — HEMOGLOBIN A1C: Hgb A1c MFr Bld: 6.7 % — ABNORMAL HIGH (ref 4.6–6.5)

## 2011-12-22 ENCOUNTER — Ambulatory Visit (INDEPENDENT_AMBULATORY_CARE_PROVIDER_SITE_OTHER): Payer: 59 | Admitting: Internal Medicine

## 2011-12-22 ENCOUNTER — Encounter: Payer: Self-pay | Admitting: Internal Medicine

## 2011-12-22 VITALS — BP 134/92 | HR 96 | Temp 98.3°F | Resp 16

## 2011-12-22 DIAGNOSIS — F4321 Adjustment disorder with depressed mood: Secondary | ICD-10-CM

## 2011-12-22 DIAGNOSIS — I10 Essential (primary) hypertension: Secondary | ICD-10-CM

## 2011-12-22 DIAGNOSIS — M1990 Unspecified osteoarthritis, unspecified site: Secondary | ICD-10-CM

## 2011-12-22 DIAGNOSIS — E119 Type 2 diabetes mellitus without complications: Secondary | ICD-10-CM

## 2011-12-22 DIAGNOSIS — J069 Acute upper respiratory infection, unspecified: Secondary | ICD-10-CM

## 2011-12-22 NOTE — Assessment & Plan Note (Signed)
Continue with current prescription therapy as reflected on the Med list.  

## 2011-12-22 NOTE — Progress Notes (Signed)
Patient ID: Renee Mann, female   DOB: 25-Oct-1955, 56 y.o.   MRN: [id number] Patient ID: Renee Mann, female   DOB: 02/18/1956, 56 y.o.   MRN: [id number]  Subjective:    Patient ID: Renee Mann, female    DOB: August 01, 1956, 56 y.o.   MRN: [id number]  HPI   The patient needs to address  chronic hypertension that has been well controlled with medicines; to address chronic  hyperlipidemia controlled with medicines as well; and to address type 2 chronic diabetes, controlled with medical treatment and diet. Her mother died last wk C/o URI sx x 1 wk  Wt Readings from Last 3 Encounters:  11/27/11 140 lb (63.504 kg)  08/24/11 132 lb (59.875 kg)  02/20/11 127 lb (57.607 kg)   BP Readings from Last 3 Encounters:  12/22/11 134/92  11/27/11 167/79  08/24/11 110/88     Review of Systems  Constitutional: Negative for fever, chills, diaphoresis, activity change, appetite change, fatigue and unexpected weight change.  HENT: Negative for hearing loss, ear pain, congestion, sore throat, sneezing, mouth sores, neck pain, dental problem, voice change, postnasal drip and sinus pressure.   Eyes: Negative for pain and visual disturbance.  Respiratory: Negative for cough, chest tightness, wheezing and stridor.   Cardiovascular: Negative for chest pain, palpitations and leg swelling.  Gastrointestinal: Negative for nausea, vomiting, abdominal pain, blood in stool, abdominal distention and rectal pain.  Genitourinary: Negative for dysuria, hematuria, decreased urine volume, vaginal bleeding, vaginal discharge, difficulty urinating, vaginal pain and menstrual problem.  Musculoskeletal: Positive for arthralgias (knees hurt). Negative for back pain, joint swelling and gait problem.  Skin: Negative for color change, rash and wound.  Neurological: Negative for dizziness, tremors, syncope, speech difficulty and light-headedness.  Hematological: Negative for adenopathy.  Psychiatric/Behavioral: Negative for  suicidal ideas, hallucinations, behavioral problems, confusion, sleep disturbance, dysphoric mood and decreased concentration. The patient is not hyperactive.        Objective:   Physical Exam  Constitutional: She appears well-developed and well-nourished. No distress.  HENT:  Head: Normocephalic.  Right Ear: External ear normal.  Left Ear: External ear normal.  Nose: Nose normal.  Mouth/Throat: Oropharynx is clear and moist.  Eyes: Conjunctivae are normal. Pupils are equal, round, and reactive to light. Right eye exhibits no discharge. Left eye exhibits no discharge.  Neck: Normal range of motion. Neck supple. No JVD present. No tracheal deviation present. No thyromegaly present.  Cardiovascular: Normal rate, regular rhythm and normal heart sounds.   Pulmonary/Chest: No stridor. No respiratory distress. She has no wheezes.  Abdominal: Soft. Bowel sounds are normal. She exhibits no distension and no mass. There is no tenderness. There is no rebound and no guarding.  Musculoskeletal: She exhibits tenderness (B Bancerina and B med collat ligs are tender on knees). She exhibits no edema.  Lymphadenopathy:    She has no cervical adenopathy.  Neurological: She displays normal reflexes. No cranial nerve deficit. She exhibits normal muscle tone. Coordination normal.  Skin: No rash noted. No erythema.  Psychiatric: She has a normal mood and affect. Her behavior is normal. Judgment and thought content normal.    Lab Results  Component Value Date   WBC 5.6 02/13/2011   HGB 13.0 02/13/2011   HCT 38.9 02/13/2011   PLT 291.0 02/13/2011   GLUCOSE 91 12/11/2011   CHOL 180 02/13/2011   TRIG 74.0 02/13/2011   HDL 45.80 02/13/2011   LDLCALC 119* 02/13/2011   ALT 28 08/14/2011   AST 19 08/14/2011  NA 141 12/11/2011   K 4.0 12/11/2011   CL 106 12/11/2011   CREATININE 0.6 12/11/2011   BUN 10 12/11/2011   CO2 27 12/11/2011   TSH 1.32 02/13/2011   HGBA1C 6.7* 12/11/2011           Assessment & Plan:

## 2011-12-22 NOTE — Patient Instructions (Signed)
Use over-the-counter  "cold" medicines  such as  "Afrin" nasal spray for nasal congestion as directed instead. Use" Delsym" or" Robitussin" cough syrup varietis for cough.  You can use plain "Tylenol" or "Advil" for fever, chills and achyness.   "Common cold" symptoms are usually triggered by a virus.  The antibiotics are usually not necessary. On average, a" viral cold" illness would take 4-7 days to resolve. Please, make an appointment if you are not better or if you're worse.  Loratidine 10 mg 1 a day

## 2011-12-22 NOTE — Progress Notes (Signed)
Addended by: Janeal Holmes on: 12/22/2011 09:38 AM   Modules accepted: Orders, Medications

## 2011-12-22 NOTE — Assessment & Plan Note (Signed)
OTC meds 

## 2011-12-22 NOTE — Assessment & Plan Note (Signed)
12/17/2022 her mother died Discussed

## 2012-04-12 ENCOUNTER — Other Ambulatory Visit (INDEPENDENT_AMBULATORY_CARE_PROVIDER_SITE_OTHER): Payer: 59

## 2012-04-12 DIAGNOSIS — E119 Type 2 diabetes mellitus without complications: Secondary | ICD-10-CM

## 2012-04-12 LAB — BASIC METABOLIC PANEL
BUN: 12 mg/dL (ref 6–23)
Calcium: 9.6 mg/dL (ref 8.4–10.5)
GFR: 94.86 mL/min (ref >60.00)
Glucose, Bld: 91 mg/dL (ref 70–99)
Potassium: 4.4 mEq/L (ref 3.5–5.1)

## 2012-04-22 ENCOUNTER — Ambulatory Visit (INDEPENDENT_AMBULATORY_CARE_PROVIDER_SITE_OTHER): Payer: 59 | Admitting: Internal Medicine

## 2012-04-22 ENCOUNTER — Encounter: Payer: Self-pay | Admitting: Internal Medicine

## 2012-04-22 VITALS — BP 128/74 | HR 81 | Temp 98.4°F | Resp 20 | Wt 141.5 lb

## 2012-04-22 DIAGNOSIS — M25569 Pain in unspecified knee: Secondary | ICD-10-CM

## 2012-04-22 DIAGNOSIS — F4321 Adjustment disorder with depressed mood: Secondary | ICD-10-CM

## 2012-04-22 DIAGNOSIS — M25562 Pain in left knee: Secondary | ICD-10-CM

## 2012-04-22 DIAGNOSIS — M25561 Pain in right knee: Secondary | ICD-10-CM

## 2012-04-22 DIAGNOSIS — I10 Essential (primary) hypertension: Secondary | ICD-10-CM

## 2012-04-22 DIAGNOSIS — G4700 Insomnia, unspecified: Secondary | ICD-10-CM

## 2012-04-22 DIAGNOSIS — E119 Type 2 diabetes mellitus without complications: Secondary | ICD-10-CM

## 2012-04-22 NOTE — Assessment & Plan Note (Signed)
Continue with current prescription therapy as reflected on the Med list.  

## 2012-04-22 NOTE — Assessment & Plan Note (Signed)
Better post Ortho cons and steroid inj B

## 2012-04-22 NOTE — Assessment & Plan Note (Signed)
Better  

## 2012-04-22 NOTE — Progress Notes (Signed)
Subjective:    Patient ID: Renee Mann, female    DOB: September 25, 1955, 56 y.o.   MRN: [id number]  HPI   The patient needs to address  chronic hypertension that has been well controlled with medicines; to address chronic  hyperlipidemia controlled with medicines as well; and to address type 2 chronic diabetes, controlled with medical treatment and diet. Her mother died  Knee pain is better after injections   Wt Readings from Last 3 Encounters:  04/22/12 141 lb 8 oz (64.184 kg)  11/27/11 140 lb (63.504 kg)  08/24/11 132 lb (59.875 kg)   BP Readings from Last 3 Encounters:  04/22/12 128/74  12/22/11 134/92  11/27/11 167/79     Review of Systems  Constitutional: Negative for fever, chills, diaphoresis, activity change, appetite change, fatigue and unexpected weight change.  HENT: Negative for hearing loss, ear pain, congestion, sore throat, sneezing, mouth sores, neck pain, dental problem, voice change, postnasal drip and sinus pressure.   Eyes: Negative for pain and visual disturbance.  Respiratory: Negative for cough, chest tightness, wheezing and stridor.   Cardiovascular: Negative for chest pain, palpitations and leg swelling.  Gastrointestinal: Negative for nausea, vomiting, abdominal pain, blood in stool, abdominal distention and rectal pain.  Genitourinary: Negative for dysuria, hematuria, decreased urine volume, vaginal bleeding, vaginal discharge, difficulty urinating, vaginal pain and menstrual problem.  Musculoskeletal: Positive for arthralgias (knees hurt). Negative for back pain, joint swelling and gait problem.  Skin: Negative for color change, rash and wound.  Neurological: Negative for dizziness, tremors, syncope, speech difficulty and light-headedness.  Hematological: Negative for adenopathy.  Psychiatric/Behavioral: Negative for suicidal ideas, hallucinations, behavioral problems, confusion, disturbed wake/sleep cycle, dysphoric mood and decreased concentration. The  patient is not hyperactive.        Objective:   Physical Exam  Constitutional: She appears well-developed and well-nourished. No distress.  HENT:  Head: Normocephalic.  Right Ear: External ear normal.  Left Ear: External ear normal.  Nose: Nose normal.  Mouth/Throat: Oropharynx is clear and moist.  Eyes: Conjunctivae are normal. Pupils are equal, round, and reactive to light. Right eye exhibits no discharge. Left eye exhibits no discharge.  Neck: Normal range of motion. Neck supple. No JVD present. No tracheal deviation present. No thyromegaly present.  Cardiovascular: Normal rate, regular rhythm and normal heart sounds.   Pulmonary/Chest: No stridor. No respiratory distress. She has no wheezes.  Abdominal: Soft. Bowel sounds are normal. She exhibits no distension and no mass. There is no tenderness. There is no rebound and no guarding.  Musculoskeletal: She exhibits tenderness (B Bancerina and B med collat ligs are tender on knees). She exhibits no edema.  Lymphadenopathy:    She has no cervical adenopathy.  Neurological: She displays normal reflexes. No cranial nerve deficit. She exhibits normal muscle tone. Coordination normal.  Skin: No rash noted. No erythema.  Psychiatric: She has a normal mood and affect. Her behavior is normal. Judgment and thought content normal.    Lab Results  Component Value Date   WBC 5.6 02/13/2011   HGB 13.0 02/13/2011   HCT 38.9 02/13/2011   PLT 291.0 02/13/2011   GLUCOSE 91 04/12/2012   CHOL 180 02/13/2011   TRIG 74.0 02/13/2011   HDL 45.80 02/13/2011   LDLCALC 119* 02/13/2011   ALT 28 08/14/2011   AST 19 08/14/2011   NA 141 04/12/2012   K 4.4 04/12/2012   CL 105 04/12/2012   CREATININE 0.8 04/12/2012   BUN 12 04/12/2012   CO2 28  04/12/2012   TSH 1.32 02/13/2011   HGBA1C 6.5 04/12/2012           Assessment & Plan:

## 2012-05-08 ENCOUNTER — Other Ambulatory Visit: Payer: Self-pay | Admitting: Internal Medicine

## 2012-05-09 ENCOUNTER — Other Ambulatory Visit: Payer: Self-pay | Admitting: General Practice

## 2012-05-09 MED ORDER — AMLODIPINE BESYLATE 5 MG PO TABS: 5.0000 mg | ORAL_TABLET | Freq: Every day | ORAL | Status: DC

## 2012-05-09 MED ORDER — METFORMIN HCL 500 MG PO TABS: 500.0000 mg | ORAL_TABLET | Freq: Every day | ORAL | Status: DC

## 2012-05-23 ENCOUNTER — Other Ambulatory Visit: Payer: Self-pay | Admitting: *Deleted

## 2012-05-23 MED ORDER — RANITIDINE HCL 150 MG PO TABS: 150.0000 mg | ORAL_TABLET | Freq: Two times a day (BID) | ORAL | Status: DC

## 2012-06-20 ENCOUNTER — Other Ambulatory Visit: Payer: Self-pay | Admitting: *Deleted

## 2012-06-20 MED ORDER — MELOXICAM 15 MG PO TABS: 15.0000 mg | ORAL_TABLET | Freq: Every day | ORAL | Status: DC | PRN

## 2012-06-20 NOTE — Telephone Encounter (Signed)
R'cd fax from [facility] for refill of Mobic.

## 2012-08-15 ENCOUNTER — Other Ambulatory Visit (INDEPENDENT_AMBULATORY_CARE_PROVIDER_SITE_OTHER): Payer: 59

## 2012-08-15 DIAGNOSIS — M25562 Pain in left knee: Secondary | ICD-10-CM

## 2012-08-15 DIAGNOSIS — E119 Type 2 diabetes mellitus without complications: Secondary | ICD-10-CM

## 2012-08-15 DIAGNOSIS — M25569 Pain in unspecified knee: Secondary | ICD-10-CM

## 2012-08-15 DIAGNOSIS — G4700 Insomnia, unspecified: Secondary | ICD-10-CM

## 2012-08-15 DIAGNOSIS — I10 Essential (primary) hypertension: Secondary | ICD-10-CM

## 2012-08-15 LAB — BASIC METABOLIC PANEL
CO2: 29 mEq/L (ref 19–32)
Calcium: 9.7 mg/dL (ref 8.4–10.5)
Creatinine, Ser: 0.8 mg/dL (ref 0.4–1.2)

## 2012-08-15 LAB — HEMOGLOBIN A1C: Hgb A1c MFr Bld: 6.9 % — ABNORMAL HIGH (ref 4.6–6.5)

## 2012-08-22 ENCOUNTER — Ambulatory Visit (INDEPENDENT_AMBULATORY_CARE_PROVIDER_SITE_OTHER): Payer: 59 | Admitting: Internal Medicine

## 2012-08-22 ENCOUNTER — Encounter: Payer: Self-pay | Admitting: Internal Medicine

## 2012-08-22 VITALS — BP 158/90 | HR 76 | Temp 98.2°F | Resp 16 | Wt 146.0 lb

## 2012-08-22 DIAGNOSIS — R05 Cough: Secondary | ICD-10-CM

## 2012-08-22 DIAGNOSIS — M1990 Unspecified osteoarthritis, unspecified site: Secondary | ICD-10-CM

## 2012-08-22 DIAGNOSIS — G4700 Insomnia, unspecified: Secondary | ICD-10-CM

## 2012-08-22 DIAGNOSIS — I10 Essential (primary) hypertension: Secondary | ICD-10-CM

## 2012-08-22 DIAGNOSIS — E119 Type 2 diabetes mellitus without complications: Secondary | ICD-10-CM

## 2012-08-22 MED ORDER — PROMETHAZINE-DM 6.25-15 MG/5ML PO SYRP: 5.0000 mL | ORAL_SOLUTION | Freq: Four times a day (QID) | ORAL | Status: DC | PRN

## 2012-08-22 NOTE — Assessment & Plan Note (Signed)
Continue with current prescription therapy as reflected on the Med list.  

## 2012-08-22 NOTE — Assessment & Plan Note (Signed)
Prom DM syr prn

## 2012-08-22 NOTE — Progress Notes (Signed)
Subjective:   HPI   The patient needs to address  chronic hypertension that has been well controlled with medicines; to address chronic  hyperlipidemia controlled with medicines as well; and to address type 2 chronic diabetes, controlled with medical treatment and diet. Her mother died  Knee pain is better after injections   Wt Readings from Last 3 Encounters:  08/22/12 146 lb (66.225 kg)  04/22/12 141 lb 8 oz (64.184 kg)  11/27/11 140 lb (63.504 kg)   BP Readings from Last 3 Encounters:  08/22/12 158/90  04/22/12 128/74  12/22/11 134/92     Review of Systems  Constitutional: Negative for fever, chills, diaphoresis, activity change, appetite change, fatigue and unexpected weight change.  HENT: Negative for hearing loss, ear pain, congestion, sore throat, sneezing, mouth sores, neck pain, dental problem, voice change, postnasal drip and sinus pressure.   Eyes: Negative for pain and visual disturbance.  Respiratory: Negative for cough, chest tightness, wheezing and stridor.   Cardiovascular: Negative for chest pain, palpitations and leg swelling.  Gastrointestinal: Negative for nausea, vomiting, abdominal pain, blood in stool, abdominal distention and rectal pain.  Genitourinary: Negative for dysuria, hematuria, decreased urine volume, vaginal bleeding, vaginal discharge, difficulty urinating, vaginal pain and menstrual problem.  Musculoskeletal: Positive for arthralgias (knees hurt). Negative for back pain, joint swelling and gait problem.  Skin: Negative for color change, rash and wound.  Neurological: Negative for dizziness, tremors, syncope, speech difficulty and light-headedness.  Hematological: Negative for adenopathy.  Psychiatric/Behavioral: Negative for suicidal ideas, hallucinations, behavioral problems, confusion, sleep disturbance, dysphoric mood and decreased concentration. The patient is not hyperactive.        Objective:   Physical Exam  Constitutional: She  appears well-developed and well-nourished. No distress.  HENT:  Head: Normocephalic.  Right Ear: External ear normal.  Left Ear: External ear normal.  Nose: Nose normal.  Mouth/Throat: Oropharynx is clear and moist.  Eyes: Conjunctivae normal are normal. Pupils are equal, round, and reactive to light. Right eye exhibits no discharge. Left eye exhibits no discharge.  Neck: Normal range of motion. Neck supple. No JVD present. No tracheal deviation present. No thyromegaly present.  Cardiovascular: Normal rate, regular rhythm and normal heart sounds.   Pulmonary/Chest: No stridor. No respiratory distress. She has no wheezes.  Abdominal: Soft. Bowel sounds are normal. She exhibits no distension and no mass. There is no tenderness. There is no rebound and no guarding.  Musculoskeletal: She exhibits tenderness (B Bancerina and B med collat ligs are tender on knees). She exhibits no edema.  Lymphadenopathy:    She has no cervical adenopathy.  Neurological: She displays normal reflexes. No cranial nerve deficit. She exhibits normal muscle tone. Coordination normal.  Skin: No rash noted. No erythema.  Psychiatric: She has a normal mood and affect. Her behavior is normal. Judgment and thought content normal.    Lab Results  Component Value Date   WBC 5.6 02/13/2011   HGB 13.0 02/13/2011   HCT 38.9 02/13/2011   PLT 291.0 02/13/2011   GLUCOSE 97 08/15/2012   CHOL 180 02/13/2011   TRIG 74.0 02/13/2011   HDL 45.80 02/13/2011   LDLCALC 119* 02/13/2011   ALT 28 08/14/2011   AST 19 08/14/2011   NA 139 08/15/2012   K 4.2 08/15/2012   CL 102 08/15/2012   CREATININE 0.8 08/15/2012   BUN 10 08/15/2012   CO2 29 08/15/2012   TSH 1.32 02/13/2011   HGBA1C 6.9* 08/15/2012  Assessment & Plan:

## 2012-08-22 NOTE — Assessment & Plan Note (Signed)
Worse. Loose wt, NAS diet Continue with current prescription therapy as reflected on the Med list.

## 2012-11-22 ENCOUNTER — Other Ambulatory Visit: Payer: Self-pay | Admitting: Internal Medicine

## 2012-12-19 ENCOUNTER — Ambulatory Visit: Payer: 59 | Admitting: Internal Medicine

## 2012-12-21 ENCOUNTER — Other Ambulatory Visit: Payer: Self-pay | Admitting: Internal Medicine

## 2012-12-28 ENCOUNTER — Other Ambulatory Visit (INDEPENDENT_AMBULATORY_CARE_PROVIDER_SITE_OTHER): Payer: 59

## 2012-12-28 DIAGNOSIS — R05 Cough: Secondary | ICD-10-CM

## 2012-12-28 DIAGNOSIS — E119 Type 2 diabetes mellitus without complications: Secondary | ICD-10-CM

## 2012-12-28 DIAGNOSIS — I10 Essential (primary) hypertension: Secondary | ICD-10-CM

## 2012-12-28 DIAGNOSIS — R059 Cough, unspecified: Secondary | ICD-10-CM

## 2012-12-28 DIAGNOSIS — M1990 Unspecified osteoarthritis, unspecified site: Secondary | ICD-10-CM

## 2012-12-28 LAB — BASIC METABOLIC PANEL
Chloride: 103 mEq/L (ref 96–112)
GFR: 110.39 mL/min (ref >60.00)
Potassium: 4.7 mEq/L (ref 3.5–5.1)
Sodium: 141 mEq/L (ref 135–145)

## 2013-01-06 ENCOUNTER — Ambulatory Visit (INDEPENDENT_AMBULATORY_CARE_PROVIDER_SITE_OTHER): Payer: 59 | Admitting: Internal Medicine

## 2013-01-06 ENCOUNTER — Encounter: Payer: Self-pay | Admitting: Internal Medicine

## 2013-01-06 VITALS — BP 130/90 | HR 72 | Temp 98.1°F | Resp 16 | Wt 148.0 lb

## 2013-01-06 DIAGNOSIS — M25569 Pain in unspecified knee: Secondary | ICD-10-CM

## 2013-01-06 DIAGNOSIS — Z0000 Encounter for general adult medical examination without abnormal findings: Secondary | ICD-10-CM

## 2013-01-06 DIAGNOSIS — M25561 Pain in right knee: Secondary | ICD-10-CM

## 2013-01-06 DIAGNOSIS — M25552 Pain in left hip: Secondary | ICD-10-CM

## 2013-01-06 DIAGNOSIS — I10 Essential (primary) hypertension: Secondary | ICD-10-CM

## 2013-01-06 DIAGNOSIS — M25559 Pain in unspecified hip: Secondary | ICD-10-CM | POA: Insufficient documentation

## 2013-01-06 MED ORDER — MELOXICAM 15 MG PO TABS: 15.0000 mg | ORAL_TABLET | Freq: Every day | ORAL | Status: DC

## 2013-01-06 MED ORDER — HYDROCODONE-ACETAMINOPHEN 5-325 MG PO TABS: 1.0000 | ORAL_TABLET | Freq: Two times a day (BID) | ORAL | Status: DC | PRN

## 2013-01-06 NOTE — Progress Notes (Signed)
Subjective:   HPI   The patient needs to address  chronic hypertension that has been well controlled with medicines; to address chronic  hyperlipidemia controlled with medicines as well; and to address type 2 chronic diabetes, controlled with medical treatment and diet. Her mother died  C/o L hip and R knee pain - bad   Wt Readings from Last 3 Encounters:  01/06/13 148 lb (67.132 kg)  08/22/12 146 lb (66.225 kg)  04/22/12 141 lb 8 oz (64.184 kg)   BP Readings from Last 3 Encounters:  01/06/13 130/90  08/22/12 158/90  04/22/12 128/74     Review of Systems  Constitutional: Negative for fever, chills, diaphoresis, activity change, appetite change, fatigue and unexpected weight change.  HENT: Negative for hearing loss, ear pain, congestion, sore throat, sneezing, mouth sores, neck pain, dental problem, voice change, postnasal drip and sinus pressure.   Eyes: Negative for pain and visual disturbance.  Respiratory: Negative for cough, chest tightness, wheezing and stridor.   Cardiovascular: Negative for chest pain, palpitations and leg swelling.  Gastrointestinal: Negative for nausea, vomiting, abdominal pain, blood in stool, abdominal distention and rectal pain.  Genitourinary: Negative for dysuria, hematuria, decreased urine volume, vaginal bleeding, vaginal discharge, difficulty urinating, vaginal pain and menstrual problem.  Musculoskeletal: Positive for arthralgias (knees hurt). Negative for back pain, joint swelling and gait problem.  Skin: Negative for color change, rash and wound.  Neurological: Negative for dizziness, tremors, syncope, speech difficulty and light-headedness.  Hematological: Negative for adenopathy.  Psychiatric/Behavioral: Negative for suicidal ideas, hallucinations, behavioral problems, confusion, sleep disturbance, dysphoric mood and decreased concentration. The patient is not hyperactive.   R b. Anserina and L troch bursa are tender     Objective:   Physical Exam  Constitutional: She appears well-developed and well-nourished. No distress.  HENT:  Head: Normocephalic.  Right Ear: External ear normal.  Left Ear: External ear normal.  Nose: Nose normal.  Mouth/Throat: Oropharynx is clear and moist.  Eyes: Conjunctivae are normal. Pupils are equal, round, and reactive to light. Right eye exhibits no discharge. Left eye exhibits no discharge.  Neck: Normal range of motion. Neck supple. No JVD present. No tracheal deviation present. No thyromegaly present.  Cardiovascular: Normal rate, regular rhythm and normal heart sounds.   Pulmonary/Chest: No stridor. No respiratory distress. She has no wheezes.  Abdominal: Soft. Bowel sounds are normal. She exhibits no distension and no mass. There is no tenderness. There is no rebound and no guarding.  Musculoskeletal: She exhibits tenderness (R b. Anserina and L troch bursa are tender). She exhibits no edema.  Lymphadenopathy:    She has no cervical adenopathy.  Neurological: She displays normal reflexes. No cranial nerve deficit. She exhibits normal muscle tone. Coordination normal.  Skin: No rash noted. No erythema.  Psychiatric: She has a normal mood and affect. Her behavior is normal. Judgment and thought content normal.    Lab Results  Component Value Date   WBC 5.6 02/13/2011   HGB 13.0 02/13/2011   HCT 38.9 02/13/2011   PLT 291.0 02/13/2011   GLUCOSE 90 12/28/2012   CHOL 180 02/13/2011   TRIG 74.0 02/13/2011   HDL 45.80 02/13/2011   LDLCALC 119* 02/13/2011   ALT 28 08/14/2011   AST 19 08/14/2011   NA 141 12/28/2012   K 4.7 12/28/2012   CL 103 12/28/2012   CREATININE 0.7 12/28/2012   BUN 9 12/28/2012   CO2 29 12/28/2012   TSH 1.32 02/13/2011   HGBA1C 6.9* 12/28/2012  Assessment & Plan:

## 2013-01-06 NOTE — Assessment & Plan Note (Signed)
Continue with current prescription therapy as reflected on the Med list.  

## 2013-01-06 NOTE — Assessment & Plan Note (Signed)
5/14 troch bursitis See meds inj if needed

## 2013-01-06 NOTE — Assessment & Plan Note (Signed)
5/14 R>>L bursitis inj offered Norco prn

## 2013-05-12 ENCOUNTER — Ambulatory Visit: Payer: 59 | Admitting: Internal Medicine

## 2013-05-15 ENCOUNTER — Other Ambulatory Visit (INDEPENDENT_AMBULATORY_CARE_PROVIDER_SITE_OTHER): Payer: 59

## 2013-05-15 DIAGNOSIS — M25559 Pain in unspecified hip: Secondary | ICD-10-CM

## 2013-05-15 DIAGNOSIS — M25569 Pain in unspecified knee: Secondary | ICD-10-CM

## 2013-05-15 DIAGNOSIS — Z0000 Encounter for general adult medical examination without abnormal findings: Secondary | ICD-10-CM

## 2013-05-15 DIAGNOSIS — M25552 Pain in left hip: Secondary | ICD-10-CM

## 2013-05-15 DIAGNOSIS — M25561 Pain in right knee: Secondary | ICD-10-CM

## 2013-05-15 DIAGNOSIS — I10 Essential (primary) hypertension: Secondary | ICD-10-CM

## 2013-05-15 LAB — HEPATIC FUNCTION PANEL
ALT: 34 U/L (ref 0–35)
AST: 26 U/L (ref 0–37)
Albumin: 4.2 g/dL (ref 3.5–5.2)
Alkaline Phosphatase: 85 U/L (ref 39–117)
Total Bilirubin: 0.3 mg/dL (ref 0.3–1.2)
Total Protein: 7.7 g/dL (ref 6.0–8.3)

## 2013-05-15 LAB — BASIC METABOLIC PANEL
BUN: 9 mg/dL (ref 6–23)
Chloride: 104 mEq/L (ref 96–112)
Creatinine, Ser: 0.7 mg/dL (ref 0.4–1.2)
GFR: 112.09 mL/min (ref >60.00)
Potassium: 4.1 mEq/L (ref 3.5–5.1)
Sodium: 138 mEq/L (ref 135–145)

## 2013-05-15 LAB — URINALYSIS
Nitrite: NEGATIVE
Urobilinogen, UA: 0.2 (ref 0.0–1.0)

## 2013-05-15 LAB — HEMOGLOBIN A1C: Hgb A1c MFr Bld: 7 % — ABNORMAL HIGH (ref 4.6–6.5)

## 2013-05-15 LAB — LIPID PANEL
Cholesterol: 168 mg/dL (ref 0–200)
HDL: 39.8 mg/dL (ref >39.00)
Triglycerides: 77 mg/dL (ref 0.0–149.0)
VLDL: 15.4 mg/dL (ref 0.0–40.0)

## 2013-05-15 LAB — TSH: TSH: 1.54 u[IU]/mL (ref 0.35–5.50)

## 2013-05-15 LAB — CBC WITH DIFFERENTIAL/PLATELET
Basophils Relative: 0.5 % (ref 0.0–3.0)
Eosinophils Relative: 2.1 % (ref 0.0–5.0)
HCT: 37.7 % (ref 36.0–46.0)
Lymphs Abs: 2.7 10*3/uL (ref 0.7–4.0)
MCV: 87.8 fl (ref 78.0–100.0)
Monocytes Absolute: 0.4 10*3/uL (ref 0.1–1.0)
Monocytes Relative: 7.8 % (ref 3.0–12.0)
Platelets: 358 10*3/uL (ref 150.0–400.0)
RBC: 4.29 Mil/uL (ref 3.87–5.11)
WBC: 5.6 10*3/uL (ref 4.5–10.5)

## 2013-05-17 ENCOUNTER — Ambulatory Visit (INDEPENDENT_AMBULATORY_CARE_PROVIDER_SITE_OTHER): Payer: 59 | Admitting: Family Medicine

## 2013-05-17 ENCOUNTER — Ambulatory Visit (INDEPENDENT_AMBULATORY_CARE_PROVIDER_SITE_OTHER): Payer: 59 | Admitting: Internal Medicine

## 2013-05-17 ENCOUNTER — Encounter: Payer: Self-pay | Admitting: Internal Medicine

## 2013-05-17 ENCOUNTER — Encounter: Payer: Self-pay | Admitting: Family Medicine

## 2013-05-17 VITALS — BP 154/92 | HR 81 | Wt 149.0 lb

## 2013-05-17 VITALS — BP 154/92 | HR 81 | Temp 98.4°F | Resp 16 | Wt 149.0 lb

## 2013-05-17 DIAGNOSIS — M76899 Other specified enthesopathies of unspecified lower limb, excluding foot: Secondary | ICD-10-CM

## 2013-05-17 DIAGNOSIS — I10 Essential (primary) hypertension: Secondary | ICD-10-CM

## 2013-05-17 DIAGNOSIS — M7662 Achilles tendinitis, left leg: Secondary | ICD-10-CM

## 2013-05-17 DIAGNOSIS — M7062 Trochanteric bursitis, left hip: Secondary | ICD-10-CM | POA: Insufficient documentation

## 2013-05-17 DIAGNOSIS — M7660 Achilles tendinitis, unspecified leg: Secondary | ICD-10-CM

## 2013-05-17 DIAGNOSIS — M25559 Pain in unspecified hip: Secondary | ICD-10-CM

## 2013-05-17 DIAGNOSIS — M7060 Trochanteric bursitis, unspecified hip: Secondary | ICD-10-CM | POA: Insufficient documentation

## 2013-05-17 DIAGNOSIS — E119 Type 2 diabetes mellitus without complications: Secondary | ICD-10-CM

## 2013-05-17 DIAGNOSIS — M25552 Pain in left hip: Secondary | ICD-10-CM

## 2013-05-17 DIAGNOSIS — M7061 Trochanteric bursitis, right hip: Secondary | ICD-10-CM | POA: Insufficient documentation

## 2013-05-17 NOTE — Patient Instructions (Signed)
[facility]  Begin with easy walking, heel, toe and backwards  Calf raises on a step First lower and then raise on 1 foot If this is painful lower on 1 foot but do the heel raise on both feet  Begin with 3 sets of 10 repetitions  Increase by 5 repetitions every 3 days  Goal is 3 sets of 30 repetitions  Do with both knee straight and knee at 20 degrees of flexion  Heel lift in your shoe  For your hip look at handout.   Meloxicam daily for 10 days.   Come back in 3 weeks if still in pain we have other tricks.

## 2013-05-17 NOTE — Assessment & Plan Note (Signed)
[facility] ref

## 2013-05-17 NOTE — Patient Instructions (Signed)
Wt Readings from Last 3 Encounters:  05/17/13 149 lb (67.586 kg)  01/06/13 148 lb (67.132 kg)  08/22/12 146 lb (66.225 kg)

## 2013-05-17 NOTE — Progress Notes (Signed)
Subjective:   HPI   The patient needs to address  chronic hypertension that has been well controlled with medicines; to address chronic  hyperlipidemia controlled with medicines as well; and to address type 2 chronic diabetes, controlled with medical treatment and diet. Her mother died  C/o L hip and R knee pain and L Achilles tendon pain - bad. Walking 1 hr a day at the [place] [facility].   Wt Readings from Last 3 Encounters:  05/17/13 149 lb (67.586 kg)  01/06/13 148 lb (67.132 kg)  08/22/12 146 lb (66.225 kg)   BP Readings from Last 3 Encounters:  05/17/13 154/92  01/06/13 130/90  08/22/12 158/90     Review of Systems  Constitutional: Negative for fever, chills, diaphoresis, activity change, appetite change, fatigue and unexpected weight change.  HENT: Negative for hearing loss, ear pain, congestion, sore throat, sneezing, mouth sores, neck pain, dental problem, voice change, postnasal drip and sinus pressure.   Eyes: Negative for pain and visual disturbance.  Respiratory: Negative for cough, chest tightness, wheezing and stridor.   Cardiovascular: Negative for chest pain, palpitations and leg swelling.  Gastrointestinal: Negative for nausea, vomiting, abdominal pain, blood in stool, abdominal distention and rectal pain.  Genitourinary: Negative for dysuria, hematuria, decreased urine volume, vaginal bleeding, vaginal discharge, difficulty urinating, vaginal pain and menstrual problem.  Musculoskeletal: Positive for arthralgias (knees hurt). Negative for back pain, joint swelling and gait problem.  Skin: Negative for color change, rash and wound.  Neurological: Negative for dizziness, tremors, syncope, speech difficulty and light-headedness.  Hematological: Negative for adenopathy.  Psychiatric/Behavioral: Negative for suicidal ideas, hallucinations, behavioral problems, confusion, sleep disturbance, dysphoric mood and decreased concentration. The patient is not hyperactive.   R b.  Anserina and L troch bursa are tender     Objective:   Physical Exam  Constitutional: She appears well-developed and well-nourished. No distress.  HENT:  Head: Normocephalic.  Right Ear: External ear normal.  Left Ear: External ear normal.  Nose: Nose normal.  Mouth/Throat: Oropharynx is clear and moist.  Eyes: Conjunctivae are normal. Pupils are equal, round, and reactive to light. Right eye exhibits no discharge. Left eye exhibits no discharge.  Neck: Normal range of motion. Neck supple. No JVD present. No tracheal deviation present. No thyromegaly present.  Cardiovascular: Normal rate, regular rhythm and normal heart sounds.   Pulmonary/Chest: No stridor. No respiratory distress. She has no wheezes.  Abdominal: Soft. Bowel sounds are normal. She exhibits no distension and no mass. There is no tenderness. There is no rebound and no guarding.  Musculoskeletal: She exhibits tenderness (R b. Anserina and L troch bursa are tender). She exhibits no edema.  Lymphadenopathy:    She has no cervical adenopathy.  Neurological: She displays normal reflexes. No cranial nerve deficit. She exhibits normal muscle tone. Coordination normal.  Skin: No rash noted. No erythema.  Psychiatric: She has a normal mood and affect. Her behavior is normal. Judgment and thought content normal.  L b. Anserina and L troch bursa are tender L Achilles tendon is tender w/oval swelling   Lab Results  Component Value Date   WBC 5.6 05/15/2013   HGB 12.6 05/15/2013   HCT 37.7 05/15/2013   PLT 358.0 05/15/2013   GLUCOSE 97 05/15/2013   CHOL 168 05/15/2013   TRIG 77.0 05/15/2013   HDL 39.80 05/15/2013   LDLCALC 113* 05/15/2013   ALT 34 05/15/2013   AST 26 05/15/2013   NA 138 05/15/2013   K 4.1 05/15/2013   CL  104 05/15/2013   CREATININE 0.7 05/15/2013   BUN 9 05/15/2013   CO2 27 05/15/2013   TSH 1.54 05/15/2013   HGBA1C 7.0* 05/15/2013           Assessment & Plan:

## 2013-05-17 NOTE — Assessment & Plan Note (Signed)
Continue with current prescription therapy as reflected on the Med list.  

## 2013-05-17 NOTE — Assessment & Plan Note (Signed)
Patient's ultrasound does show nodule formation but no true tearing appreciated. Patient did heel lifts and start doing strengthening he centric exercises. Meloxicam daily for 10 days Icing protocol Patient then come back again in 3 weeks for further evaluation. Continuing to have trouble we'll consider a nitroglycerin patch.

## 2013-05-17 NOTE — Progress Notes (Signed)
I'm seeing this patient by the request  of:  Dr. Posey Rea  CC: hip and ankle pain  HPI: 57 year old female coming in with left hip and left ankle pain. Regarding her left hip pain patient states that it has been multiple months duration. Patient states it is worse with activity such as going up stairs as well as sometimes hurts her when she sleeps. Patient states is on the lateral aspect and can radiate down her leg. Patient denies any back pain. Patient is taking meloxicam intermittently which did be beneficial. Patient has not noticed any weakness but states that the pain has stopped her from walking which is causing her to gain some weight. Patient describes the pain as more of a dull aching sensation that can have sharp pain down the leg. Patient is a severity a 7/10.  Regarding her left ankle patient states that this has been hurting as well and is worse with activity. Patient states on the posterior aspect of her heel and points to the Achilles tendon. Patient states it is worse with stairs and loss of activity. Patient states this is more of a dull aching sensation. Patient does not remember any trauma. Patient states that the meloxicam can make this better as well. Patient was severity 6/10.   Past medical, surgical, family and social history reviewed. Medications reviewed all in the electronic medical record.   Review of Systems: No headache, visual changes, nausea, vomiting, diarrhea, constipation, dizziness, abdominal pain, skin rash, fevers, chills, night sweats, weight loss, swollen lymph nodes, body aches, joint swelling, muscle aches, chest pain, shortness of breath, mood changes.   Objective:    Blood pressure 154/92, pulse 81, weight 149 lb (67.586 kg).   General: No apparent distress alert and oriented x3 mood and affect normal, dressed appropriately.  HEENT: Pupils equal, extraocular movements intact Respiratory: Patient's speak in full sentences and does not appear short of  breath Cardiovascular: No lower extremity edema, non tender, no erythema Skin: Warm dry intact with no signs of infection or rash on extremities or on axial skeleton. Abdomen: Soft nontender Neuro: Cranial nerves II through XII are intact, neurovascularly intact in all extremities with 2+ DTRs and 2+ pulses. Lymph: No lymphadenopathy of posterior or anterior cervical chain or axillae bilaterally.  Gait normal with good balance and coordination.  MSK: Non tender with full range of motion and good stability and symmetric strength and tone of shoulders, elbows, wrist, knee bilaterally.  Hip: ROM IR: 35 Deg, ER: 45 Deg, Flexion: 120 Deg, Extension: 100 Deg, Abduction: 45 Deg, Adduction: 45 Deg Strength IR: 5/5, ER: 5/5, Flexion: 5/5, Extension: 5/5, Abduction: 3/5, Adduction: 5/5 Pelvic alignment unremarkable to inspection and palpation. Standing hip rotation and gait without trendelenburg sign / unsteadiness. Greater trochanter severely tenderness to palpation. Moderate tenderness over piriformis and greater trochanter. Positive pain with FABER BUT NO PAIN WITH FADIR. No SI joint tenderness and normal minimal SI movement.  Ankle: No visible erythema or swelling. Range of motion is full in all directions. Strength is 5/5 in all directions. Stable lateral and medial ligaments; squeeze test and kleiger test unremarkable; Talar dome nontender; No pain at base of 5th MT; No tenderness over cuboid; No tenderness over N spot or navicular prominence No tenderness on posterior aspects of lateral and medial malleolus No sign of peroneal tendon subluxations or tenderness to palpation Negative tarsal tunnel tinel's Able to walk 4 steps. Achilles tendon does have a small nodule approximately 1 cm in diameter that is approximately  3 cm from insertion. Patient does have full flexion and extension. Patient is minimally tender to palpation in this area and is neurovascularly intact distally.  MSK US  performed of: Left ankle This study was ordered, performed, and interpreted by Terrilee Files D.O.  Foot/Ankle:   All structures visualized.   Talar dome unremarkable  Ankle mortise without effusion. Peroneus longus and brevis tendons unremarkable on long and transverse views without sheath effusions. Posterior tibialis, flexor hallucis longus, and flexor digitorum longus tendons unremarkable on long and transverse views without sheath effusions. Achilles tendon visualized with mild nodule noted in same area on physical exam. Trace hypoechoic area shows effusion of the tendon sheath. This measures 1.8 cm in diameter. No tear appreciated Anterior Talofibular Ligament and Calcaneofibular Ligaments unremarkable and intact. Deltoid Ligament unremarkable and intact. Plantar fascia intact and without effusion, normal thickness. No increased doppler signal, cap sign, or thickening of tibial cortex. Power doppler signal normal.  IMPRESSION:  Chronic Achilles tendinopathy with nodule  After verbal consent patient was prepped with 2 alcohol swabs and under ultrasound guidance did have injection with a 3 inch 22-gauge needle with 4 cc of Marcaine 0.5% and 1 cc of Kenalog 40 mg/dL into the greater trochanteric area. Pictures were taken. Patient had complete resolution of pain immediately. Prep with Band-Aid and postinjection care instructions given.    Impression and Recommendations:     This case required medical decision making of moderate complexity.

## 2013-05-17 NOTE — Assessment & Plan Note (Signed)
Patient had injection and did very well. Patient given home exercise program and we'll do this on a regular basis. Encourage her to  lose weight as well. Patient followup in 3 weeks for further evaluation

## 2013-05-17 NOTE — Assessment & Plan Note (Signed)
Sports med ref

## 2013-05-22 ENCOUNTER — Other Ambulatory Visit: Payer: Self-pay | Admitting: Internal Medicine

## 2013-06-07 ENCOUNTER — Ambulatory Visit: Payer: 59 | Admitting: Family Medicine

## 2013-06-20 ENCOUNTER — Other Ambulatory Visit: Payer: Self-pay | Admitting: Internal Medicine

## 2013-08-26 ENCOUNTER — Other Ambulatory Visit: Payer: Self-pay | Admitting: Internal Medicine

## 2013-09-12 ENCOUNTER — Other Ambulatory Visit (INDEPENDENT_AMBULATORY_CARE_PROVIDER_SITE_OTHER): Payer: 59

## 2013-09-12 DIAGNOSIS — E119 Type 2 diabetes mellitus without complications: Secondary | ICD-10-CM

## 2013-09-12 DIAGNOSIS — I10 Essential (primary) hypertension: Secondary | ICD-10-CM

## 2013-09-12 LAB — HEMOGLOBIN A1C: HEMOGLOBIN A1C: 6.9 % — AB (ref 4.6–6.5)

## 2013-09-12 LAB — BASIC METABOLIC PANEL
BUN: 12 mg/dL (ref 6–23)
CHLORIDE: 105 meq/L (ref 96–112)
CO2: 27 meq/L (ref 19–32)
Calcium: 9.4 mg/dL (ref 8.4–10.5)
Creatinine, Ser: 0.7 mg/dL (ref 0.4–1.2)
GFR: 117.86 mL/min (ref >60.00)
Glucose, Bld: 95 mg/dL (ref 70–99)
POTASSIUM: 4 meq/L (ref 3.5–5.1)
Sodium: 139 mEq/L (ref 135–145)

## 2013-09-19 ENCOUNTER — Encounter: Payer: Self-pay | Admitting: Internal Medicine

## 2013-09-19 ENCOUNTER — Ambulatory Visit (INDEPENDENT_AMBULATORY_CARE_PROVIDER_SITE_OTHER): Payer: 59 | Admitting: Internal Medicine

## 2013-09-19 VITALS — BP 160/100 | HR 80 | Temp 98.4°F | Resp 16 | Wt 150.0 lb

## 2013-09-19 DIAGNOSIS — E119 Type 2 diabetes mellitus without complications: Secondary | ICD-10-CM

## 2013-09-19 DIAGNOSIS — K219 Gastro-esophageal reflux disease without esophagitis: Secondary | ICD-10-CM

## 2013-09-19 DIAGNOSIS — I10 Essential (primary) hypertension: Secondary | ICD-10-CM

## 2013-09-19 DIAGNOSIS — M25519 Pain in unspecified shoulder: Secondary | ICD-10-CM

## 2013-09-19 MED ORDER — METHYLPREDNISOLONE ACETATE 40 MG/ML IJ SUSP: 40.0000 mg | Freq: Once | INTRAMUSCULAR | Status: DC

## 2013-09-19 MED ORDER — HYDROCODONE-ACETAMINOPHEN 5-325 MG PO TABS: 1.0000 | ORAL_TABLET | Freq: Two times a day (BID) | ORAL | Status: DC | PRN

## 2013-09-19 NOTE — Progress Notes (Signed)
Pre visit review using our clinic review tool, if applicable. No additional management support is needed unless otherwise documented below in the visit note. 

## 2013-09-19 NOTE — Assessment & Plan Note (Signed)
Continue with current prescription therapy as reflected on the Med list.  

## 2013-09-19 NOTE — Assessment & Plan Note (Signed)
See procedure Will inject

## 2013-09-19 NOTE — Patient Instructions (Signed)
Postprocedure instructions :    A Band-Aid should be left on for 12 hours. Injection therapy is not a cure itself. It is used in conjunction with other modalities. You can use nonsteroidal anti-inflammatories like ibuprofen , hot and cold compresses. Rest is recommended in the next 24 hours. You need to report immediately  if fever, chills or any signs of infection develop. 

## 2013-09-19 NOTE — Progress Notes (Signed)
Patient ID: Renee Mann, female   DOB: 1956-06-22, 58 y.o.   MRN: [id number]   Subjective:   HPI  C/o R shoulder The patient needs to address  chronic hypertension that has been well controlled with medicines; to address chronic  hyperlipidemia controlled with medicines as well; and to address type 2 chronic diabetes, controlled with medical treatment and diet. Her mother died  F/u L hip and R knee pain and L Achilles tendon pain - better Walking 1 hr a day at the [facility].   Wt Readings from Last 3 Encounters:  09/19/13 150 lb (68.04 kg)  05/17/13 149 lb (67.586 kg)  05/17/13 149 lb (67.586 kg)   BP Readings from Last 3 Encounters:  09/19/13 160/100  05/17/13 154/92  05/17/13 154/92     Review of Systems  Constitutional: Negative for fever, chills, diaphoresis, activity change, appetite change, fatigue and unexpected weight change.  HENT: Negative for congestion, dental problem, ear pain, hearing loss, mouth sores, postnasal drip, sinus pressure, sneezing, sore throat and voice change.   Eyes: Negative for pain and visual disturbance.  Respiratory: Negative for cough, chest tightness, wheezing and stridor.   Cardiovascular: Negative for chest pain, palpitations and leg swelling.  Gastrointestinal: Negative for nausea, vomiting, abdominal pain, blood in stool, abdominal distention and rectal pain.  Genitourinary: Negative for dysuria, hematuria, decreased urine volume, vaginal bleeding, vaginal discharge, difficulty urinating, vaginal pain and menstrual problem.  Musculoskeletal: Positive for arthralgias (knees hurt). Negative for back pain, gait problem, joint swelling and neck pain.  Skin: Negative for color change, rash and wound.  Neurological: Negative for dizziness, tremors, syncope, speech difficulty and light-headedness.  Hematological: Negative for adenopathy.  Psychiatric/Behavioral: Negative for suicidal ideas, hallucinations, behavioral problems, confusion, sleep  disturbance, dysphoric mood and decreased concentration. The patient is not hyperactive.   R b. Anserina and L troch bursa are tender     Objective:   Physical Exam  Constitutional: She appears well-developed and well-nourished. No distress.  HENT:  Head: Normocephalic.  Right Ear: External ear normal.  Left Ear: External ear normal.  Nose: Nose normal.  Mouth/Throat: Oropharynx is clear and moist.  Eyes: Conjunctivae are normal. Pupils are equal, round, and reactive to light. Right eye exhibits no discharge. Left eye exhibits no discharge.  Neck: Normal range of motion. Neck supple. No JVD present. No tracheal deviation present. No thyromegaly present.  Cardiovascular: Normal rate, regular rhythm and normal heart sounds.   Pulmonary/Chest: No stridor. No respiratory distress. She has no wheezes.  Abdominal: Soft. Bowel sounds are normal. She exhibits no distension and no mass. There is no tenderness. There is no rebound and no guarding.  Musculoskeletal: She exhibits tenderness (R b. Anserina and L troch bursa are tender). She exhibits no edema.  Lymphadenopathy:    She has no cervical adenopathy.  Neurological: She displays normal reflexes. No cranial nerve deficit. She exhibits normal muscle tone. Coordination normal.  Skin: No rash noted. No erythema.  Psychiatric: She has a normal mood and affect. Her behavior is normal. Judgment and thought content normal.  R shoulder is tender w/ROM   Lab Results  Component Value Date   WBC 5.6 05/15/2013   HGB 12.6 05/15/2013   HCT 37.7 05/15/2013   PLT 358.0 05/15/2013   GLUCOSE 95 09/12/2013   CHOL 168 05/15/2013   TRIG 77.0 05/15/2013   HDL 39.80 05/15/2013   LDLCALC 113* 05/15/2013   ALT 34 05/15/2013   AST 26 05/15/2013   NA 139 09/12/2013  K 4.0 09/12/2013   CL 105 09/12/2013   CREATININE 0.7 09/12/2013   BUN 12 09/12/2013   CO2 27 09/12/2013   TSH 1.54 05/15/2013   HGBA1C 6.9* 09/12/2013     Procedure :Joint Injection, R  shoulder    Indication:  Subacromial bursitis with refractory  chronic pain.   Risks including unsuccessful procedure , bleeding, infection, bruising, skin atrophy, "steroid flare-up" and others were explained to the patient in detail as well as the benefits. Informed consent was obtained and signed.   Tthe patient was placed in a comfortable position. Lateral approach was used. Skin was prepped with Betadine and alcohol  and anesthetized with a cooling spray. Then, a 5 cc syringe with a 2 inch long 24-gauge needle was used for a joint injection.. The needle was advanced  Into the subacromial space.The bursa was injected with 3 mL of 2% lidocaine and 40 mg of Depo-Medrol .  Band-Aid was applied.   Tolerated well. Complications: None. Good pain relief following the procedure.   Postprocedure instructions :    A Band-Aid should be left on for 12 hours. Injection therapy is not a cure itself. It is used in conjunction with other modalities. You can use nonsteroidal anti-inflammatories like ibuprofen , hot and cold compresses. Rest is recommended in the next 24 hours. You need to report immediately  if fever, chills or any signs of infection develop.         Assessment & Plan:

## 2013-09-22 ENCOUNTER — Telehealth: Payer: Self-pay

## 2013-09-22 NOTE — Telephone Encounter (Signed)
Relevant patient education mailed to patient.  

## 2013-12-18 ENCOUNTER — Other Ambulatory Visit: Payer: Self-pay | Admitting: Internal Medicine

## 2014-01-18 ENCOUNTER — Other Ambulatory Visit: Payer: Self-pay | Admitting: Internal Medicine

## 2014-02-15 ENCOUNTER — Other Ambulatory Visit: Payer: Self-pay | Admitting: Internal Medicine

## 2014-02-18 ENCOUNTER — Ambulatory Visit (INDEPENDENT_AMBULATORY_CARE_PROVIDER_SITE_OTHER): Payer: 59 | Admitting: Physician Assistant

## 2014-02-18 VITALS — BP 162/92 | HR 97 | Temp 98.3°F | Resp 18 | Ht <= 58 in | Wt 146.8 lb

## 2014-02-18 DIAGNOSIS — L293 Anogenital pruritus, unspecified: Secondary | ICD-10-CM

## 2014-02-18 DIAGNOSIS — A499 Bacterial infection, unspecified: Secondary | ICD-10-CM

## 2014-02-18 DIAGNOSIS — N898 Other specified noninflammatory disorders of vagina: Secondary | ICD-10-CM

## 2014-02-18 DIAGNOSIS — B9689 Other specified bacterial agents as the cause of diseases classified elsewhere: Secondary | ICD-10-CM

## 2014-02-18 DIAGNOSIS — N760 Acute vaginitis: Secondary | ICD-10-CM

## 2014-02-18 DIAGNOSIS — L299 Pruritus, unspecified: Secondary | ICD-10-CM

## 2014-02-18 LAB — POCT WET PREP WITH KOH
KOH Prep POC: NEGATIVE
RBC WET PREP PER HPF POC: NEGATIVE
Trichomonas, UA: NEGATIVE
WBC Wet Prep HPF POC: NEGATIVE
Yeast Wet Prep HPF POC: NEGATIVE

## 2014-02-18 MED ORDER — PREDNISONE 20 MG PO TABS: 40.0000 mg | ORAL_TABLET | Freq: Every day | ORAL | Status: DC

## 2014-02-18 MED ORDER — HYDROXYZINE HCL 25 MG PO TABS: 25.0000 mg | ORAL_TABLET | Freq: Every evening | ORAL | Status: DC | PRN

## 2014-02-18 MED ORDER — CETIRIZINE HCL 10 MG PO TABS: 10.0000 mg | ORAL_TABLET | Freq: Every day | ORAL | Status: DC

## 2014-02-18 MED ORDER — METRONIDAZOLE 500 MG PO TABS: 500.0000 mg | ORAL_TABLET | Freq: Two times a day (BID) | ORAL | Status: DC

## 2014-02-18 NOTE — Patient Instructions (Signed)
Take the metronidazole (Flagyl) as directed.  DO NOT DRINK ALCOHOL WHILE TAKING THIS MEDICATION or for 48 hours after your last dose  Take the prednisone for the next 3 days - this will improve the itching  Take the cetirizine every morning if needed for itching  Take the hydroxyzine at bedtime if needed for itching (may make you sleepy, so be careful with this)  Do not use the bath and body soap anymore  Please let us know if you are worsening or not improving   Bacterial Vaginosis Bacterial vaginosis is a vaginal infection that occurs when the normal balance of bacteria in the vagina is disrupted. It results from an overgrowth of certain bacteria. This is the most common vaginal infection in women of childbearing age. Treatment is important to prevent complications, especially in pregnant women, as it can cause a premature delivery. CAUSES  Bacterial vaginosis is caused by an increase in harmful bacteria that are normally present in smaller amounts in the vagina. Several different kinds of bacteria can cause bacterial vaginosis. However, the reason that the condition develops is not fully understood. RISK FACTORS Certain activities or behaviors can put you at an increased risk of developing bacterial vaginosis, including:  Having a new sex partner or multiple sex partners.  Douching.  Using an intrauterine device (IUD) for contraception. Women do not get bacterial vaginosis from toilet seats, bedding, swimming pools, or contact with objects around them. SIGNS AND SYMPTOMS  Some women with bacterial vaginosis have no signs or symptoms. Common symptoms include:  Grey vaginal discharge.  A fishlike odor with discharge, especially after sexual intercourse.  Itching or burning of the vagina and vulva.  Burning or pain with urination. DIAGNOSIS  Your health care provider will take a medical history and examine the vagina for signs of bacterial vaginosis. A sample of vaginal fluid may  be taken. Your health care provider will look at this sample under a microscope to check for bacteria and abnormal cells. A vaginal pH test may also be done.  TREATMENT  Bacterial vaginosis may be treated with antibiotic medicines. These may be given in the form of a pill or a vaginal cream. A second round of antibiotics may be prescribed if the condition comes back after treatment.  HOME CARE INSTRUCTIONS   Only take over-the-counter or prescription medicines as directed by your health care provider.  If antibiotic medicine was prescribed, take it as directed. Make sure you finish it even if you start to feel better.  Do not have sex until treatment is completed.  Tell all sexual partners that you have a vaginal infection. They should see their health care provider and be treated if they have problems, such as a mild rash or itching.  Practice safe sex by using condoms and only having one sex partner. SEEK MEDICAL CARE IF:   Your symptoms are not improving after 3 days of treatment.  You have increased discharge or pain.  You have a fever. MAKE SURE YOU:   Understand these instructions.  Will watch your condition.  Will get help right away if you are not doing well or get worse. FOR MORE INFORMATION  Centers for Disease Control and Prevention, Division of STD Prevention: [employer]raiser[employer]raud[employer] American Sexual Health Association (ASHA): www.ashastd.org  Document Released: 08/10/2005 Document Revised: 05/31/2013 Document Reviewed: 03/22/2013 [facility] Patient Information 2015 [place], [place]. This information is not intended to replace advice given to you by your health care provider. Make sure you discuss any questions you  have with your health care provider.  

## 2014-02-18 NOTE — Progress Notes (Signed)
Subjective:    Patient ID: Renee Mann, female    DOB: 24-Jun-1956, 58 y.o.   MRN: [id number]  HPI   Ms. Newhart is a very pleasant 58 yr old female with complaint of vaginal itching x 2 weeks.  Reports the itching is both internal and external.  Denies vaginal discharge.  Thinks she had something similar a long time ago. She denies abd pain, NV, FC.  Not sure if there are any external lesions.  She also reports that in the last couple of days she is itching all over now.  Can't sleep she is itching so much.  She reports she did get new soap from [place] and [employer] - typically uses Dove.  No new detergents, clothing, foods, etc.  She is married and has no concern for STI.  She declines testing today.    BP is elevated today.  Pt reports she is on HTN medication but did not take it today   Review of Systems  Constitutional: Negative for fever and chills.  Respiratory: Negative.   Cardiovascular: Negative.   Gastrointestinal: Negative for nausea, vomiting and abdominal pain.  Genitourinary: Negative for dysuria, vaginal bleeding and vaginal discharge.  Skin: Negative for rash and wound.       Pruritus       Objective:   Physical Exam  Vitals reviewed. Constitutional: She is oriented to person, place, and time. She appears well-developed and well-nourished. No distress.  HENT:  Head: Normocephalic and atraumatic.  Eyes: Conjunctivae are normal. No scleral icterus.  Cardiovascular: Normal rate, regular rhythm and normal heart sounds.   Pulmonary/Chest: Effort normal and breath sounds normal. She has no wheezes. She has no rales.  Abdominal: Soft. There is no tenderness.  Genitourinary: There is no rash, tenderness or lesion on the right labia. There is no rash, tenderness or lesion on the left labia. No vaginal discharge found.  Neurological: She is alert and oriented to person, place, and time.  Skin: Skin is warm and dry.  Multiple excoriated areas at back and inner thighs; no rash or  lesion  Psychiatric: She has a normal mood and affect. Her behavior is normal.    Results for orders placed in visit on 02/18/14  POCT WET PREP WITH KOH      Result Value Ref Range   Trichomonas, UA Negative     Clue Cells Wet Prep HPF POC 6-8     Epithelial Wet Prep HPF POC 6-10     Yeast Wet Prep HPF POC neg     Bacteria Wet Prep HPF POC 1+     RBC Wet Prep HPF POC neg     WBC Wet Prep HPF POC neg     KOH Prep POC Negative         Assessment & Plan:  Bacterial vaginosis  Vaginal itching - Plan: POCT Wet Prep with KOH, metroNIDAZOLE (FLAGYL) 500 MG tablet  Generalized pruritus - Plan: cetirizine (ZYRTEC) 10 MG tablet, hydrOXYzine (ATARAX/VISTARIL) 25 MG tablet, predniSONE (DELTASONE) 20 MG tablet   Ms. Spalla is a very pleasant 58 yr old female with bacterial vaginosis.  Treat with Flagyl BID x 7 days.  Pt also has generalized pruritus.  She did begin using a new soap which may be responsible for her symptoms.  There are no rashes or lesions.  She has no systemic symptoms.  Encouraged her to d/c the new soap.  Prednisone 40mg  daily x 3 days.  Zyrtec and Atarax for itching.  Discussed RTC precautions  Pt to call or RTC if worsening or not improving  E. Natividad Brood MHS, PA-C [facility] 6/28/201511:20 AM

## 2014-02-26 ENCOUNTER — Telehealth: Payer: Self-pay

## 2014-02-26 DIAGNOSIS — E119 Type 2 diabetes mellitus without complications: Secondary | ICD-10-CM

## 2014-02-26 NOTE — Telephone Encounter (Signed)
Diabetic bundle- a1c ordered

## 2014-03-12 ENCOUNTER — Other Ambulatory Visit (INDEPENDENT_AMBULATORY_CARE_PROVIDER_SITE_OTHER): Payer: 59

## 2014-03-12 ENCOUNTER — Encounter: Payer: Self-pay | Admitting: Internal Medicine

## 2014-03-12 ENCOUNTER — Ambulatory Visit (INDEPENDENT_AMBULATORY_CARE_PROVIDER_SITE_OTHER): Payer: 59 | Admitting: Internal Medicine

## 2014-03-12 VITALS — BP 140/80 | HR 87 | Temp 98.1°F | Ht <= 58 in | Wt 146.1 lb

## 2014-03-12 DIAGNOSIS — N760 Acute vaginitis: Secondary | ICD-10-CM

## 2014-03-12 DIAGNOSIS — L299 Pruritus, unspecified: Secondary | ICD-10-CM

## 2014-03-12 DIAGNOSIS — B9689 Other specified bacterial agents as the cause of diseases classified elsewhere: Secondary | ICD-10-CM

## 2014-03-12 DIAGNOSIS — A499 Bacterial infection, unspecified: Secondary | ICD-10-CM

## 2014-03-12 DIAGNOSIS — E119 Type 2 diabetes mellitus without complications: Secondary | ICD-10-CM

## 2014-03-12 LAB — CBC WITH DIFFERENTIAL/PLATELET
BASOS ABS: 0 10*3/uL (ref 0.0–0.1)
BASOS PCT: 0.5 % (ref 0.0–3.0)
EOS ABS: 0.8 10*3/uL — AB (ref 0.0–0.7)
Eosinophils Relative: 12.3 % — ABNORMAL HIGH (ref 0.0–5.0)
HCT: 40.7 % (ref 36.0–46.0)
Hemoglobin: 13.4 g/dL (ref 12.0–15.0)
Lymphocytes Relative: 40.8 % (ref 12.0–46.0)
Lymphs Abs: 2.8 10*3/uL (ref 0.7–4.0)
MCHC: 32.8 g/dL (ref 30.0–36.0)
MCV: 90.3 fl (ref 78.0–100.0)
MONO ABS: 0.5 10*3/uL (ref 0.1–1.0)
Monocytes Relative: 7.1 % (ref 3.0–12.0)
Neutro Abs: 2.7 10*3/uL (ref 1.4–7.7)
Neutrophils Relative %: 39.3 % — ABNORMAL LOW (ref 43.0–77.0)
Platelets: 372 10*3/uL (ref 150.0–400.0)
RBC: 4.5 Mil/uL (ref 3.87–5.11)
RDW: 14.4 % (ref 11.5–15.5)
WBC: 6.9 10*3/uL (ref 4.0–10.5)

## 2014-03-12 LAB — URINALYSIS
BILIRUBIN URINE: NEGATIVE
HGB URINE DIPSTICK: NEGATIVE
KETONES UR: NEGATIVE
Leukocytes, UA: NEGATIVE
Nitrite: NEGATIVE
Specific Gravity, Urine: <=1.005 — AB (ref 1.000–1.030)
Total Protein, Urine: NEGATIVE
URINE GLUCOSE: NEGATIVE
Urobilinogen, UA: 0.2 (ref 0.0–1.0)
pH: 7 (ref 5.0–8.0)

## 2014-03-12 LAB — SEDIMENTATION RATE: Sed Rate: 33 mm/hr — ABNORMAL HIGH (ref 0–22)

## 2014-03-12 LAB — HEPATIC FUNCTION PANEL
ALT: 33 U/L (ref 0–35)
AST: 28 U/L (ref 0–37)
Albumin: 4.1 g/dL (ref 3.5–5.2)
Alkaline Phosphatase: 81 U/L (ref 39–117)
BILIRUBIN TOTAL: 0.5 mg/dL (ref 0.2–1.2)
Bilirubin, Direct: 0.1 mg/dL (ref 0.0–0.3)
Total Protein: 7.6 g/dL (ref 6.0–8.3)

## 2014-03-12 LAB — BASIC METABOLIC PANEL
BUN: 9 mg/dL (ref 6–23)
CO2: 28 mEq/L (ref 19–32)
CREATININE: 0.7 mg/dL (ref 0.4–1.2)
Calcium: 9.6 mg/dL (ref 8.4–10.5)
Chloride: 105 mEq/L (ref 96–112)
GFR: 115.64 mL/min (ref >60.00)
Glucose, Bld: 95 mg/dL (ref 70–99)
POTASSIUM: 3.8 meq/L (ref 3.5–5.1)
Sodium: 141 mEq/L (ref 135–145)

## 2014-03-12 LAB — TSH: TSH: 2.25 u[IU]/mL (ref 0.35–4.50)

## 2014-03-12 LAB — LIPID PANEL
Cholesterol: 191 mg/dL (ref 0–200)
HDL: 39.3 mg/dL (ref >39.00)
LDL CALC: 129 mg/dL — AB (ref 0–99)
NonHDL: 151.7
TRIGLYCERIDES: 112 mg/dL (ref 0.0–149.0)
Total CHOL/HDL Ratio: 5
VLDL: 22.4 mg/dL (ref 0.0–40.0)

## 2014-03-12 LAB — HEMOGLOBIN A1C: HEMOGLOBIN A1C: 7.1 % — AB (ref 4.6–6.5)

## 2014-03-12 MED ORDER — DIPHENHYDRAMINE HCL 25 MG PO TABS: 25.0000 mg | ORAL_TABLET | Freq: Four times a day (QID) | ORAL | Status: DC | PRN

## 2014-03-12 MED ORDER — AMLODIPINE BESYLATE 5 MG PO TABS: ORAL_TABLET | ORAL | Status: DC

## 2014-03-12 MED ORDER — METHYLPREDNISOLONE ACETATE 80 MG/ML IJ SUSP
80.0000 mg | Freq: Once | INTRAMUSCULAR | Status: AC
  Administered 2014-03-12: 80 mg via INTRAMUSCULAR

## 2014-03-12 MED ORDER — METFORMIN HCL 500 MG PO TABS: ORAL_TABLET | ORAL | Status: DC

## 2014-03-12 NOTE — Patient Instructions (Signed)
Hold Meloxicam and Vit D

## 2014-03-12 NOTE — Assessment & Plan Note (Signed)
Labs

## 2014-03-12 NOTE — Assessment & Plan Note (Addendum)
?  etiology Hold Meloxicam and Vit D Benadryl prn Depomedrol 80 mg IM  Potential benefits of a short term steroid  use as well as potential risks  and complications were explained to the patient and were aknowledged.

## 2014-03-12 NOTE — Progress Notes (Signed)
Subjective:   HPI  C/o itching all over x 1 mo. No new meds. UC treated for bact vaginosis.... The patient needs to address  chronic hypertension that has been well controlled with medicines; to address chronic  hyperlipidemia controlled with medicines as well; and to address type 2 chronic diabetes, controlled with medical treatment and diet. Her mother died  F/u L hip and R knee pain and L Achilles tendon pain - better Walking 1 hr a day at the [place].   Wt Readings from Last 3 Encounters:  03/12/14 146 lb 1.9 oz (66.28 kg)  02/18/14 146 lb 12.8 oz (66.588 kg)  09/19/13 150 lb (68.04 kg)   BP Readings from Last 3 Encounters:  03/12/14 140/80  02/18/14 162/92  09/19/13 160/100     Review of Systems  Constitutional: Negative for fever, chills, diaphoresis, activity change, appetite change, fatigue and unexpected weight change.  HENT: Negative for congestion, dental problem, ear pain, hearing loss, mouth sores, postnasal drip, sinus pressure, sneezing, sore throat and voice change.   Eyes: Negative for pain and visual disturbance.  Respiratory: Negative for cough, chest tightness, wheezing and stridor.   Cardiovascular: Negative for chest pain, palpitations and leg swelling.  Gastrointestinal: Negative for nausea, vomiting, abdominal pain, blood in stool, abdominal distention and rectal pain.  Genitourinary: Negative for dysuria, hematuria, decreased urine volume, vaginal bleeding, vaginal discharge, difficulty urinating, vaginal pain and menstrual problem.  Musculoskeletal: Positive for arthralgias (knees hurt). Negative for back pain, gait problem, joint swelling and neck pain.  Skin: Negative for color change, rash and wound.  Neurological: Negative for dizziness, tremors, syncope, speech difficulty and light-headedness.  Hematological: Negative for adenopathy.  Psychiatric/Behavioral: Negative for suicidal ideas, hallucinations, behavioral problems, confusion, sleep disturbance,  dysphoric mood and decreased concentration. The patient is not hyperactive.   R b. Anserina and L troch bursa are tender     Objective:   Physical Exam  Constitutional: She appears well-developed and well-nourished. No distress.  HENT:  Head: Normocephalic.  Right Ear: External ear normal.  Left Ear: External ear normal.  Nose: Nose normal.  Mouth/Throat: Oropharynx is clear and moist.  Eyes: Conjunctivae are normal. Pupils are equal, round, and reactive to light. Right eye exhibits no discharge. Left eye exhibits no discharge.  Neck: Normal range of motion. Neck supple. No JVD present. No tracheal deviation present. No thyromegaly present.  Cardiovascular: Normal rate, regular rhythm and normal heart sounds.   Pulmonary/Chest: No stridor. No respiratory distress. She has no wheezes.  Abdominal: Soft. Bowel sounds are normal. She exhibits no distension and no mass. There is no tenderness. There is no rebound and no guarding.  Musculoskeletal: She exhibits tenderness (R b. Anserina and L troch bursa are tender). She exhibits no edema.  Lymphadenopathy:    She has no cervical adenopathy.  Neurological: She displays normal reflexes. No cranial nerve deficit. She exhibits normal muscle tone. Coordination normal.  Skin: No rash noted. No erythema.  Psychiatric: She has a normal mood and affect. Her behavior is normal. Judgment and thought content normal.  R shoulder is tender w/ROM   Lab Results  Component Value Date   WBC 5.6 05/15/2013   HGB 12.6 05/15/2013   HCT 37.7 05/15/2013   PLT 358.0 05/15/2013   GLUCOSE 95 09/12/2013   CHOL 168 05/15/2013   TRIG 77.0 05/15/2013   HDL 39.80 05/15/2013   LDLCALC 113* 05/15/2013   ALT 34 05/15/2013   AST 26 05/15/2013   NA 139 09/12/2013  K 4.0 09/12/2013   CL 105 09/12/2013   CREATININE 0.7 09/12/2013   BUN 12 09/12/2013   CO2 27 09/12/2013   TSH 1.54 05/15/2013   HGBA1C 6.9* 09/12/2013      Assessment & Plan:

## 2014-03-12 NOTE — Assessment & Plan Note (Signed)
7.15 - treated

## 2014-03-12 NOTE — Progress Notes (Signed)
Pre visit review using our clinic review tool, if applicable. No additional management support is needed unless otherwise documented below in the visit note. 

## 2014-04-09 ENCOUNTER — Ambulatory Visit (INDEPENDENT_AMBULATORY_CARE_PROVIDER_SITE_OTHER): Payer: 59

## 2014-04-09 ENCOUNTER — Ambulatory Visit (INDEPENDENT_AMBULATORY_CARE_PROVIDER_SITE_OTHER): Payer: 59 | Admitting: Internal Medicine

## 2014-04-09 VITALS — BP 168/90 | HR 96 | Temp 98.2°F | Resp 16 | Ht <= 58 in | Wt 151.0 lb

## 2014-04-09 DIAGNOSIS — M25471 Effusion, right ankle: Secondary | ICD-10-CM

## 2014-04-09 DIAGNOSIS — M25571 Pain in right ankle and joints of right foot: Secondary | ICD-10-CM

## 2014-04-09 DIAGNOSIS — M25579 Pain in unspecified ankle and joints of unspecified foot: Secondary | ICD-10-CM

## 2014-04-09 DIAGNOSIS — M25476 Effusion, unspecified foot: Secondary | ICD-10-CM

## 2014-04-09 DIAGNOSIS — M25473 Effusion, unspecified ankle: Secondary | ICD-10-CM

## 2014-04-09 LAB — POCT CBC
Granulocyte percent: 63.7 %G (ref 37–80)
HCT, POC: 43.3 % (ref 37.7–47.9)
HEMOGLOBIN: 13.9 g/dL (ref 12.2–16.2)
LYMPH, POC: 3.5 — AB (ref 0.6–3.4)
MCH: 29.1 pg (ref 27–31.2)
MCHC: 32.1 g/dL (ref 31.8–35.4)
MCV: 90.5 fL (ref 80–97)
MID (CBC): 0.8 (ref 0–0.9)
MPV: 6.6 fL (ref 0–99.8)
PLATELET COUNT, POC: 406 10*3/uL (ref 142–424)
POC Granulocyte: 7.5 — AB (ref 2–6.9)
POC LYMPH PERCENT: 29.5 %L (ref 10–50)
POC MID %: 6.8 % (ref 0–12)
RBC: 4.78 M/uL (ref 4.04–5.48)
RDW, POC: 14.7 %
WBC: 11.7 10*3/uL — AB (ref 4.6–10.2)

## 2014-04-09 LAB — POCT SEDIMENTATION RATE: POCT SED RATE: 45 mm/hr — AB (ref 0–22)

## 2014-04-09 MED ORDER — COLCHICINE 0.6 MG PO TABS: ORAL_TABLET | ORAL | Status: DC

## 2014-04-09 MED ORDER — OXYCODONE-ACETAMINOPHEN 5-325 MG PO TABS: 1.0000 | ORAL_TABLET | Freq: Four times a day (QID) | ORAL | Status: DC | PRN

## 2014-04-09 NOTE — Progress Notes (Signed)
Subjective:    Patient ID: Renee Mann, female    DOB: [birth year], 58 y.o.   MRN: [id number]  HPI This 58 year old female presents with acute right ankle pain over the last 24 hours in the absence of any injury She has swelling and has been unable to bear weight due to the pain This is the same ankle that was repaired surgically 15 years ago She has no history of arthritis and has never had gout  Her main problems include diabetes and hypertension For which she takes Norvasc and metformin--at her last exam she was told that her diabetes is stable  Review of Systems No fever chills or night sweats No chest pain or shortness of breath No abdominal pain No urinary symptoms    Objective:   Physical Exam BP 168/90  Pulse 96  Temp(Src) 98.2 F (36.8 C) (Oral)  Resp 16  Ht 4\' 10"  (1.473 m)  Wt 151 lb (68.493 kg)  BMI 31.57 kg/m2  SpO2 98% she feels her blood pressure is increased today because someone made her very angry with a phone call as she entered our building No acute distress She has a normal heart rate and normal respiratory rate The right ankle is slightly swollen and exquisitely tender, palpably warm, but without redness. Any range of motion he is tender. The foot exam is normal and the Achilles is intact without discomfort There is no numbness to palpation There are no skin changes over the foot or ankle   [facility] reading (PRIMARY) by  Dr. Laney Pastor x-rays reveal a fusion.pinning of the talus and the calcaneus which appears stable There is an extra area of unresolved fracture at the medial malleolus which is old No changes to suggest acute or chronic arthritis are seen.   Results for orders placed in visit on 04/09/14  POCT CBC      Result Value Ref Range   WBC 11.7 (*) 4.6 - 10.2 K/uL   Lymph, poc 3.5 (*) 0.6 - 3.4   POC LYMPH PERCENT 29.5  10 - 50 %L   MID (cbc) 0.8  0 - 0.9   POC MID % 6.8  0 - 12 %M   POC Granulocyte 7.5 (*) 2 - 6.9   Granulocyte percent  63.7  37 - 80 %G   RBC 4.78  4.04 - 5.48 M/uL   Hemoglobin 13.9  12.2 - 16.2 g/dL   HCT, POC 43.3  37.7 - 47.9 %   MCV 90.5  80 - 97 fL   MCH, POC 29.1  27 - 31.2 pg   MCHC 32.1  31.8 - 35.4 g/dL   RDW, POC 14.7     Platelet Count, POC 406  142 - 424 K/uL   MPV 6.6  0 - 99.8 fL        Assessment & Plan:  Acute arthritis of the right ankle which is most likely gout  Check uric acid Meds ordered this encounter  Medications  . colchicine 0.6 MG tablet    Sig: 1 tablet twice a day until arthritis has resolved and then one tablet once a day for 3 more days    Dispense:  20 tablet    Refill:  0  . oxyCODONE-acetaminophen (ROXICET) 5-325 MG per tablet    Sig: Take 1 tablet by mouth every 6 (six) hours as needed for severe pain.    Dispense:  20 tablet    Refill:  0   Elevate/advance weight-bearing as tolerated Follow up  with Dr. Charyl Bigger office as planned

## 2014-04-10 LAB — URIC ACID: Uric Acid, Serum: 3.7 mg/dL (ref 2.4–7.0)

## 2014-07-15 ENCOUNTER — Other Ambulatory Visit: Payer: Self-pay | Admitting: Internal Medicine

## 2014-07-24 LAB — HM MAMMOGRAPHY

## 2014-12-18 ENCOUNTER — Ambulatory Visit (INDEPENDENT_AMBULATORY_CARE_PROVIDER_SITE_OTHER): Payer: 59 | Admitting: Internal Medicine

## 2014-12-18 VITALS — BP 136/90 | HR 105 | Temp 98.1°F | Resp 18 | Ht 59.0 in | Wt 153.0 lb

## 2014-12-18 DIAGNOSIS — M659 Synovitis and tenosynovitis, unspecified: Secondary | ICD-10-CM

## 2014-12-18 MED ORDER — OXYCODONE-ACETAMINOPHEN 5-325 MG PO TABS: 1.0000 | ORAL_TABLET | Freq: Four times a day (QID) | ORAL | Status: DC | PRN

## 2014-12-18 MED ORDER — COLCHICINE 0.6 MG PO TABS: ORAL_TABLET | ORAL | Status: DC

## 2014-12-18 NOTE — Progress Notes (Signed)
Subjective:    Patient ID: Renee Mann, female    DOB: 14-Jan-1956, 58 y.o.   MRN: [id number]  This chart was scribed for Tonye Pearson, MD by Ronney Lion, ED Scribe. This patient was seen in room 1 and the patient's care was started at 4:40 PM.   HPI  HPI Comments: Renee Mann is a 59 y.o. female who presents to the [facility] complaining of constant, moderate-to-severe left foot pain that began when she woke up in the middle of last night due to sudden onset pain. She also complains of associated swelling. Patient denies any known trauma or injury. Patient has a history of a similar pain thought to be "gout" in her right foot in 03/2014.  Patient last took colchicine the last time and responded quickly, however her uric acid level turned out to be low. She has been asymptomatic since.  No chills or fever.  Patient reports her DM is stable -PCP Sonda Primes, MD a1C 7.1 during her last visit July 2015. She does not have any scheduled appointments with him soon.   Past Medical History  Diagnosis Date  . Diabetes mellitus 2008    type II  . Hives   . 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 Hypertension   . Osteoarthritis   . Gastroesophageal reflux disease    Patient Active Problem List   Diagnosis Date Noted  . Pruritus 03/12/2014  . Bacterial vaginosis 03/12/2014  . Left Achilles tendinitis 05/17/2013  . Greater trochanteric bursitis of left hip 05/17/2013  . Left hip pain 01/06/2013  . Cough 08/22/2012  . Grief 12/22/2011  . Well adult exam 02/20/2011  . Knee pain, bilateral 02/20/2011  . Sinusitis acute 02/20/2011  . CRAMP OF LIMB 10/17/2010  . THROAT PAIN, SEVERE 10/17/2010  . GERD 07/16/2010  . CHEST WALL PAIN, ACUTE 07/16/2010  . UPPER RESPIRATORY INFECTION, ACUTE 11/28/2009  . PARESTHESIA 11/28/2009  . TONSILLITIS 01/24/2009  . NECK PAIN 01/24/2009  . OSTEOARTHRITIS 03/27/2008  . FOOT PAIN 11/23/2007  . INSOMNIA, PERSISTENT 07/13/2007  .  HYPERTENSION 07/13/2007  . SHOULDER PAIN 07/13/2007  . DIABETES MELLITUS, TYPE II 06/20/2007    Prior to Admission medications   Medication Sig Start Date End Date Taking? Authorizing Provider  amLODipine (NORVASC) 5 MG tablet TAKE ONE TABLET BY MOUTH ONCE DAILY 03/12/14  Yes Aleksei Plotnikov V, MD  metFORMIN (GLUCOPHAGE) 500 MG tablet TAKE ONE TABLET BY MOUTH ONCE DAILY WITH BREAKFAST 03/12/14  Yes Aleksei Plotnikov V, MD  ranitidine (ZANTAC) 150 MG tablet TAKE ONE TABLET BY MOUTH TWICE DAILY 07/16/14  Yes Corwin Levins, MD  cetirizine (ZYRTEC) 10 MG tablet Take 1 tablet (10 mg total) by mouth daily. Patient not taking: Reported on 12/18/2014 02/18/14   Godfrey Pick, PA-C  Cholecalciferol (VITAMIN D3) 1000 UNITS tablet Take 1,000 Units by mouth daily.      Historical Provider, MD  colchicine 0.6 MG tablet 1 tablet twice a day until arthritis has resolved and then one tablet once a day for 3 more days 12/18/14   Tonye Pearson, MD  diphenhydrAMINE (BENADRYL) 25 MG tablet Take 1 tablet (25 mg total) by mouth every 6 (six) hours as needed for itching. Patient not taking: Reported on 12/18/2014 03/12/14   Tresa Garter, MD  oxyCODONE-acetaminophen (ROXICET) 5-325 MG per tablet Take 1 tablet by mouth every 6 (six) hours as needed for severe pain. 12/18/14   Molly Maduro  Clemencia Course, MD   Allergies  Allergen Reactions  . Codeine Hives  . Atenolol     REACTION: WEIRD FEELINGS  . Codeine Sulfate   . Hydrochlorothiazide W-Triamterene     REACTION: H/A     Review of Systems Noncontributory     Objective:   Physical Exam  Constitutional: She is oriented to person, place, and time. She appears well-developed and well-nourished. No distress.  Eyes: Conjunctivae and EOM are normal. Pupils are equal, round, and reactive to light.  Neck: Neck supple.  Cardiovascular: Normal rate and intact distal pulses.   Pulmonary/Chest: Effort normal.  Musculoskeletal: She exhibits tenderness. She exhibits  no edema.  The dorsum of the left foot is swollen and exquisitely tender, slightly red, and slightly warm. There is pain with any ROM and of the extensor tendons 2, 3, and 4. The ankle joint itself is not swollen or tender.  Neurological: She is alert and oriented to person, place, and time. No cranial nerve deficit.  Psychiatric: She has a normal mood and affect.  Nursing note and vitals reviewed. BP 136/90 mmHg  Pulse 105  Temp(Src) 98.1 F (36.7 C) (Oral)  Resp 18  Ht 4\' 11"  (1.499 m)  Wt 153 lb (69.4 kg)  BMI 30.89 kg/m2  SpO2 99%      Assessment & Plan:  Tenosynovitis of foot  this should prove to be gout or pseudogout given the intensity of tenderness I'll have the office schedule follow-up with Dr. Posey Rea to try and establish a diagnosis. Meds ordered this encounter  Medications  . colchicine 0.6 MG tablet    Sig: 1 tablet twice a day until arthritis has resolved and then one tablet once a day for 3 more days    Dispense:  20 tablet    Refill:  0  . oxyCODONE-acetaminophen (ROXICET) 5-325 MG per tablet    Sig: Take 1 tablet by mouth every 6 (six) hours as needed for severe pain.    Dispense:  20 tablet    Refill:  0  I have completed the patient encounter in its entirety as documented by the scribe, with editing by me where necessary. Monserrath Junio P. Merla Riches, M.D.

## 2015-01-12 ENCOUNTER — Ambulatory Visit (INDEPENDENT_AMBULATORY_CARE_PROVIDER_SITE_OTHER): Payer: 59 | Admitting: Family Medicine

## 2015-01-12 VITALS — BP 120/80 | HR 91 | Temp 98.4°F | Ht 59.0 in | Wt 151.4 lb

## 2015-01-12 DIAGNOSIS — M436 Torticollis: Secondary | ICD-10-CM | POA: Diagnosis not present

## 2015-01-12 MED ORDER — METAXALONE 800 MG PO TABS: 800.0000 mg | ORAL_TABLET | Freq: Three times a day (TID) | ORAL | Status: DC

## 2015-01-12 MED ORDER — PREDNISONE 20 MG PO TABS: ORAL_TABLET | ORAL | Status: DC

## 2015-01-12 NOTE — Progress Notes (Addendum)
° °  Subjective:  This chart was scribed for Renee Haber, MD by Leandra Kern, Medical Scribe. This patient was seen in Room 13 and the patient's care was started at 3:53 PM.   Patient ID: Renee Mann, female    DOB: [birth year], 59 y.o.   MRN: [id number]  HPI HPI Comments: Renee Mann is a 59 y.o. female who presents to [facility] complaining of a sharp neck pain, gradual onset after waking up in the morning 3 days ago.  Patient reports that pain started on the right of the neck. Now it is present bilaterally, and had radiated to her head and right arm. She obviously feels uncomfortable with her stiff neck. She also notes having a tingly feeling to her right hand, with a needle stabbing sensation. Patient tried taking oxycodone to the pain, however, she found no relief. She also noted that she tries putting heating pads on the affected area.  She denies having blurred vision. She reports her blood sugar being under control.   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    Review of Systems  Musculoskeletal: Positive for neck pain and neck stiffness.       Objective:   Physical Exam  Constitutional: She is oriented to person, place, and time. She appears well-developed and well-nourished. No distress.  HENT:  Head: Normocephalic and atraumatic.  Mouth/Throat: Oropharynx is clear and moist. No oropharyngeal exudate.  Eyes: Pupils are equal, round, and reactive to light.  Neck:  Decreased range of motion: Patient can only rotate her neck 20 each way and can barely look up or down. Her neck is nontender with palpation and shows a normal contour. There is some muscle tightness in both trapezius areas, worse on the right  Cardiovascular: Normal rate, regular rhythm and normal heart sounds.   Pulmonary/Chest: Effort normal and breath sounds normal.  Musculoskeletal: She exhibits no edema.  Neurological: She is alert and oriented to person, place, and time. She  displays normal reflexes. No cranial nerve deficit. Coordination normal.  Skin: Skin is warm and dry. No rash noted.  Psychiatric: She has a normal mood and affect. Her behavior is normal. Judgment and thought content normal.  Nursing note and vitals reviewed.   Patient clearly uncomfortable with no significant rotational range of motion of her neck and no point tenderness there. Her reflexes are normal and her strength is normal in her upper extremities. There is no gross deformity of her cervical spine. There is no muscle wasting.    Assessment & Plan:   This chart was scribed in my presence and reviewed by me personally.    ICD-9-CM ICD-10-CM   1. Torticollis 723.5 M43.6 predniSONE (DELTASONE) 20 MG tablet     metaxalone (SKELAXIN) 800 MG tablet     Signed, Renee Haber, MD

## 2015-01-12 NOTE — Patient Instructions (Signed)

## 2015-02-08 ENCOUNTER — Ambulatory Visit: Payer: Self-pay | Admitting: Internal Medicine

## 2015-02-18 ENCOUNTER — Ambulatory Visit (INDEPENDENT_AMBULATORY_CARE_PROVIDER_SITE_OTHER): Payer: Commercial Managed Care - HMO | Admitting: Family Medicine

## 2015-02-18 VITALS — BP 132/68 | HR 102 | Temp 98.8°F | Resp 18 | Ht <= 58 in | Wt 152.0 lb

## 2015-02-18 DIAGNOSIS — M659 Synovitis and tenosynovitis, unspecified: Secondary | ICD-10-CM

## 2015-02-18 DIAGNOSIS — M25531 Pain in right wrist: Secondary | ICD-10-CM | POA: Diagnosis not present

## 2015-02-18 NOTE — Progress Notes (Signed)
59 yo woman who has 2 days of right wrist pain without h/o trauma.  The medial wrist is becoming more swollen, tender and reddended with pain radiating up forearm and down the thumb.  Objective: BP 132/68 mmHg  Pulse 102  Temp(Src) 98.8 F (37.1 C) (Oral)  Resp 18  Ht 4\' 10"  (1.473 m)  Wt 152 lb (68.947 kg)  BMI 31.78 kg/m2  SpO2 99% Right hand shows some swelling, redness, and point tenderness over the medial wrist just proximal to the navicular  Area was prepped with betdadine and then injected with 20 mg depomedrol and 1/2 cc of Marcaine 0.5% with some relief.  No complications  Wrist ROM is normal, but resisted extension of the thumb is painful.  This chart was scribed in my presence and reviewed by me personally.    ICD-9-CM ICD-10-CM   1. Wrist pain, acute, right 719.43 M25.531   2. Synovitis and tenosynovitis 727.00 M65.9      Signed, [name], MD

## 2015-02-20 ENCOUNTER — Ambulatory Visit (INDEPENDENT_AMBULATORY_CARE_PROVIDER_SITE_OTHER): Payer: Commercial Managed Care - HMO | Admitting: Family Medicine

## 2015-02-20 ENCOUNTER — Ambulatory Visit (INDEPENDENT_AMBULATORY_CARE_PROVIDER_SITE_OTHER): Payer: Commercial Managed Care - HMO

## 2015-02-20 VITALS — BP 156/70 | HR 88 | Temp 98.7°F | Resp 16 | Ht 58.5 in | Wt 151.6 lb

## 2015-02-20 DIAGNOSIS — M25531 Pain in right wrist: Secondary | ICD-10-CM

## 2015-02-20 DIAGNOSIS — Z8639 Personal history of other endocrine, nutritional and metabolic disease: Secondary | ICD-10-CM | POA: Diagnosis not present

## 2015-02-20 DIAGNOSIS — M25441 Effusion, right hand: Secondary | ICD-10-CM | POA: Diagnosis not present

## 2015-02-20 DIAGNOSIS — Z8739 Personal history of other diseases of the musculoskeletal system and connective tissue: Secondary | ICD-10-CM

## 2015-02-20 DIAGNOSIS — L539 Erythematous condition, unspecified: Secondary | ICD-10-CM

## 2015-02-20 LAB — COMPLETE METABOLIC PANEL WITHOUT GFR
AST: 22 U/L (ref 0–37)
Alkaline Phosphatase: 127 U/L — ABNORMAL HIGH (ref 39–117)
BUN: 10 mg/dL (ref 6–23)
Creat: 0.7 mg/dL (ref 0.50–1.10)
GFR, Est African American: >89 mL/min
GFR, Est Non African American: >89 mL/min
Potassium: 4 meq/L (ref 3.5–5.3)

## 2015-02-20 LAB — COMPLETE METABOLIC PANEL WITH GFR
ALT: 30 U/L (ref 0–35)
Albumin: 4.3 g/dL (ref 3.5–5.2)
CO2: 30 mEq/L (ref 19–32)
Calcium: 9.9 mg/dL (ref 8.4–10.5)
Chloride: 95 mEq/L — ABNORMAL LOW (ref 96–112)
Glucose, Bld: 191 mg/dL — ABNORMAL HIGH (ref 70–99)
Sodium: 139 mEq/L (ref 135–145)
Total Bilirubin: 0.3 mg/dL (ref 0.2–1.2)
Total Protein: 7.9 g/dL (ref 6.0–8.3)

## 2015-02-20 LAB — POCT CBC
Granulocyte percent: 76 %G (ref 37–80)
HCT, POC: 42.7 % (ref 37.7–47.9)
Hemoglobin: 13.3 g/dL (ref 12.2–16.2)
Lymph, poc: 2.3 (ref 0.6–3.4)
MCH, POC: 27.6 pg (ref 27–31.2)
MCHC: 31.2 g/dL — AB (ref 31.8–35.4)
MCV: 88.6 fL (ref 80–97)
MID (cbc): 0.1 (ref 0–0.9)
MPV: 7.3 fL (ref 0–99.8)
POC Granulocyte: 7.5 — AB (ref 2–6.9)
POC LYMPH PERCENT: 23.2 %L (ref 10–50)
POC MID %: 0.8 %M (ref 0–12)
Platelet Count, POC: 412 10*3/uL (ref 142–424)
RBC: 4.82 M/uL (ref 4.04–5.48)
RDW, POC: 14.9 %
WBC: 9.9 10*3/uL (ref 4.6–10.2)

## 2015-02-20 LAB — URIC ACID: Uric Acid, Serum: 4.3 mg/dL (ref 2.4–7.0)

## 2015-02-20 MED ORDER — OXYCODONE-ACETAMINOPHEN 5-325 MG PO TABS: 1.0000 | ORAL_TABLET | Freq: Three times a day (TID) | ORAL | Status: DC | PRN

## 2015-02-20 MED ORDER — CEPHALEXIN 500 MG PO CAPS: 500.0000 mg | ORAL_CAPSULE | Freq: Four times a day (QID) | ORAL | Status: DC

## 2015-02-20 MED ORDER — COLCHICINE 0.6 MG PO TABS: 0.6000 mg | ORAL_TABLET | Freq: Every day | ORAL | Status: DC

## 2015-02-20 NOTE — Patient Instructions (Signed)
Low-Purine Diet Purines are compounds that affect the level of uric acid in your body. A low-purine diet is a diet that is low in purines. Eating a low-purine diet can prevent the level of uric acid in your body from getting too high and causing gout or kidney stones or both. WHAT DO I NEED TO KNOW ABOUT THIS DIET?  Choose low-purine foods. Examples of low-purine foods are listed in the next section.  Drink plenty of fluids, especially water. Fluids can help remove uric acid from your body. Try to drink 8-16 cups (1.9-3.8 L) a day.  Limit foods high in fat, especially saturated fat, as fat makes it harder for the body to get rid of uric acid. Foods high in saturated fat include pizza, cheese, ice cream, whole milk, fried foods, and gravies. Choose foods that are lower in fat and lean sources of protein. Use olive oil when cooking as it contains healthy fats that are not high in saturated fat.  Limit alcohol. Alcohol interferes with the elimination of uric acid from your body. If you are having a gout attack, avoid all alcohol.  Keep in mind that different people's bodies react differently to different foods. You will probably learn over time which foods do or do not affect you. If you discover that a food tends to cause your gout to flare up, avoid eating that food. You can more freely enjoy foods that do not cause problems. If you have any questions about a food item, talk to your dietitian or health care provider. WHICH FOODS ARE LOW, MODERATE, AND HIGH IN PURINES? The following is a list of foods that are low, moderate, and high in purines. You can eat any amount of the foods that are low in purines. You may be able to have small amounts of foods that are moderate in purines. Ask your health care provider how much of a food moderate in purines you can have. Avoid foods high in purines. Grains  Foods low in purines: Enriched white bread, pasta, rice, cake, cornbread, popcorn.  Foods moderate in  purines: Whole-grain breads and cereals, wheat germ, bran, oatmeal. Uncooked oatmeal. Dry wheat bran or wheat germ.  Foods high in purines: Pancakes, French toast, biscuits, muffins. Vegetables  Foods low in purines: All vegetables, except those that are moderate in purines.  Foods moderate in purines: Asparagus, cauliflower, spinach, mushrooms, green peas. Fruits  All fruits are low in purines. Meats and other Protein Foods  Foods low in purines: Eggs, nuts, peanut butter.  Foods moderate in purines: 80-90% lean beef, lamb, veal, pork, poultry, fish, eggs, peanut butter, nuts. Crab, lobster, oysters, and shrimp. Cooked dried beans, peas, and lentils.  Foods high in purines: Anchovies, sardines, herring, mussels, tuna, codfish, scallops, trout, and haddock. Bacon. Organ meats (such as liver or kidney). Tripe. Game meat. Goose. Sweetbreads. Dairy  All dairy foods are low in purines. Low-fat and fat-free dairy products are best because they are low in saturated fat. Beverages  Drinks low in purines: Water, carbonated beverages, tea, coffee, cocoa.  Drinks moderate in purines: Soft drinks and other drinks sweetened with high-fructose corn syrup. Juices. To find whether a food or drink is sweetened with high-fructose corn syrup, look at the ingredients list.  Drinks high in purines: Alcoholic beverages (such as beer). Condiments  Foods low in purines: Salt, herbs, olives, pickles, relishes, vinegar.  Foods moderate in purines: Butter, margarine, oils, mayonnaise. Fats and Oils  Foods low in purines: All types, except gravies   and sauces made with meat.  Foods high in purines: Gravies and sauces made with meat. Other Foods  Foods low in purines: Sugars, sweets, gelatin. Cake. Soups made without meat.  Foods moderate in purines: Meat-based or fish-based soups, broths, or bouillons. Foods and drinks sweetened with high-fructose corn syrup.  Foods high in purines: High-fat desserts  (such as ice cream, cookies, cakes, pies, doughnuts, and chocolate). Contact your dietitian for more information on foods that are not listed here. Document Released: 12/05/2010 Document Revised: 08/15/2013 Document Reviewed: 07/17/2013 [facility] Patient Information 2015 [place], [place]. This information is not intended to replace advice given to you by your health care provider. Make sure you discuss any questions you have with your health care provider. Gout Gout is an inflammatory arthritis caused by a buildup of uric acid crystals in the joints. Uric acid is a chemical that is normally present in the blood. When the level of uric acid in the blood is too high it can form crystals that deposit in your joints and tissues. This causes joint redness, soreness, and swelling (inflammation). Repeat attacks are common. Over time, uric acid crystals can form into masses (tophi) near a joint, destroying bone and causing disfigurement. Gout is treatable and often preventable. CAUSES  The disease begins with elevated levels of uric acid in the blood. Uric acid is produced by your body when it breaks down a naturally found substance called purines. Certain foods you eat, such as meats and fish, contain high amounts of purines. Causes of an elevated uric acid level include:  Being passed down from parent to child (heredity).  Diseases that cause increased uric acid production (such as obesity, psoriasis, and certain cancers).  Excessive alcohol use.  Diet, especially diets rich in meat and seafood.  Medicines, including certain cancer-fighting medicines (chemotherapy), water pills (diuretics), and aspirin.  Chronic kidney disease. The kidneys are no longer able to remove uric acid well.  Problems with metabolism. Conditions strongly associated with gout include:  Obesity.  High blood pressure.  High cholesterol.  Diabetes. Not everyone with elevated uric acid levels gets gout. It is not  understood why some people get gout and others do not. Surgery, joint injury, and eating too much of certain foods are some of the factors that can lead to gout attacks. SYMPTOMS   An attack of gout comes on quickly. It causes intense pain with redness, swelling, and warmth in a joint.  Fever can occur.  Often, only one joint is involved. Certain joints are more commonly involved:  Base of the big toe.  Knee.  Ankle.  Wrist.  Finger. Without treatment, an attack usually goes away in a few days to weeks. Between attacks, you usually will not have symptoms, which is different from many other forms of arthritis. DIAGNOSIS  Your caregiver will suspect gout based on your symptoms and exam. In some cases, tests may be recommended. The tests may include:  Blood tests.  Urine tests.  X-rays.  Joint fluid exam. This exam requires a needle to remove fluid from the joint (arthrocentesis). Using a microscope, gout is confirmed when uric acid crystals are seen in the joint fluid. TREATMENT  There are two phases to gout treatment: treating the sudden onset (acute) attack and preventing attacks (prophylaxis).  Treatment of an Acute Attack.  Medicines are used. These include anti-inflammatory medicines or steroid medicines.  An injection of steroid medicine into the affected joint is sometimes necessary.  The painful joint is rested. Movement can  worsen the arthritis.  You may use warm or cold treatments on painful joints, depending which works best for you.  Treatment to Prevent Attacks.  If you suffer from frequent gout attacks, your caregiver may advise preventive medicine. These medicines are started after the acute attack subsides. These medicines either help your kidneys eliminate uric acid from your body or decrease your uric acid production. You may need to stay on these medicines for a very long time.  The early phase of treatment with preventive medicine can be associated with  an increase in acute gout attacks. For this reason, during the first few months of treatment, your caregiver may also advise you to take medicines usually used for acute gout treatment. Be sure you understand your caregiver's directions. Your caregiver may make several adjustments to your medicine dose before these medicines are effective.  Discuss dietary treatment with your caregiver or dietitian. Alcohol and drinks high in sugar and fructose and foods such as meat, poultry, and seafood can increase uric acid levels. Your caregiver or dietitian can advise you on drinks and foods that should be limited. HOME CARE INSTRUCTIONS   Do not take aspirin to relieve pain. This raises uric acid levels.  Only take over-the-counter or prescription medicines for pain, discomfort, or fever as directed by your caregiver.  Rest the joint as much as possible. When in bed, keep sheets and blankets off painful areas.  Keep the affected joint raised (elevated).  Apply warm or cold treatments to painful joints. Use of warm or cold treatments depends on which works best for you.  Use crutches if the painful joint is in your leg.  Drink enough fluids to keep your urine clear or pale yellow. This helps your body get rid of uric acid. Limit alcohol, sugary drinks, and fructose drinks.  Follow your dietary instructions. Pay careful attention to the amount of protein you eat. Your daily diet should emphasize fruits, vegetables, whole grains, and fat-free or low-fat milk products. Discuss the use of coffee, vitamin C, and cherries with your caregiver or dietitian. These may be helpful in lowering uric acid levels.  Maintain a healthy body weight. SEEK MEDICAL CARE IF:   You develop diarrhea, vomiting, or any side effects from medicines.  You do not feel better in 24 hours, or you are getting worse. SEEK IMMEDIATE MEDICAL CARE IF:   Your joint becomes suddenly more tender, and you have chills or a fever. MAKE  SURE YOU:   Understand these instructions.  Will watch your condition.  Will get help right away if you are not doing well or get worse. Document Released: 08/07/2000 Document Revised: 12/25/2013 Document Reviewed: 03/23/2012 [facility] Patient Information 2015 [place], [place]. This information is not intended to replace advice given to you by your health care provider. Make sure you discuss any questions you have with your health care provider.

## 2015-02-20 NOTE — Progress Notes (Signed)
Chief Complaint:  Chief Complaint  Patient presents with  . Follow-up    Recheck right hand, wrist pain    HPI: Renee Mann is a 59 y.o. female who is here for right hand and wrist pain recheck. She got a hydro-cortisone injection in the office for synovitis since there was no trauma but it has continued to be painful. She can't sleep. She can't move it. She has a history of gout in her feet. She has never had it in her arms or wrists. No fevers or chills. The redness and swelling of the pain has gotten worse. No known trauma. She has pain even at rest. She feels like there is pain radiating up her shoulder. She again denies any fevers or chills. This does feel similar to her gout. Her diabetes is well controlled.  Lab Results  Component Value Date   HGBA1C 7.1* 03/12/2014   HGBA1C 6.9* 09/12/2013   HGBA1C 7.0* 05/15/2013   Lab Results  Component Value Date   LDLCALC 129* 03/12/2014   CREATININE 0.7 03/12/2014     Past Medical History  Diagnosis Date  . Diabetes mellitus 2008    type II  . Hives   . 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 Hypertension   . Osteoarthritis   . Gastroesophageal reflux disease    Past Surgical History  Procedure Laterality Date  . Abdominal hysterectomy    . Burritis surgery  1998    L. hip   History   Social History  . Marital Status: Married    Spouse Name: N/A  . Number of Children: N/A  . Years of Education: N/A   Occupational History  . 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 Smoking status: Former Smoker    Quit date: 08/24/1998  . Smokeless tobacco: Not on file  . Alcohol Use: No  . Drug Use: No  . Sexual Activity: Yes   Other Topics Concern  . None   Social History Narrative   ** Merged History Encounter **       Family History  Problem Relation Age of Onset  . Hypertension    . Hypertension Mother   . Cancer Mother   . Stroke Mother   . Hypertension Sister    Allergies    Allergen Reactions  . Codeine Hives  . Atenolol     REACTION: WEIRD FEELINGS  . Codeine Sulfate   . Hydrochlorothiazide W-Triamterene     REACTION: H/A   Prior to Admission medications   Medication Sig Start Date End Date Taking? Authorizing Provider  amLODipine (NORVASC) 5 MG tablet TAKE ONE TABLET BY MOUTH ONCE DAILY 03/12/14  Yes Aleksei Plotnikov V, MD  metFORMIN (GLUCOPHAGE) 500 MG tablet TAKE ONE TABLET BY MOUTH ONCE DAILY WITH BREAKFAST 03/12/14  Yes Aleksei Plotnikov V, MD  cetirizine (ZYRTEC) 10 MG tablet Take 1 tablet (10 mg total) by mouth daily. Patient not taking: Reported on 12/18/2014 02/18/14   Theda Sers, PA-C  Cholecalciferol (VITAMIN D3) 1000 UNITS tablet Take 1,000 Units by mouth daily.      Historical Provider, MD  colchicine 0.6 MG tablet 1 tablet twice a day until arthritis has resolved and then one tablet once a day for 3 more days Patient not taking: Reported on 02/18/2015 12/18/14   Leandrew Koyanagi, MD  diphenhydrAMINE (BENADRYL) 25 MG tablet Take 1 tablet (25  mg total) by mouth every 6 (six) hours as needed for itching. Patient not taking: Reported on 12/18/2014 03/12/14   Tyrone Apple Plotnikov V, MD  metaxalone (SKELAXIN) 800 MG tablet Take 1 tablet (800 mg total) by mouth 3 (three) times daily. Patient not taking: Reported on 02/18/2015 01/12/15   [name], MD  ranitidine (ZANTAC) 150 MG tablet TAKE ONE TABLET BY MOUTH TWICE DAILY Patient not taking: Reported on 02/18/2015 07/16/14   Biagio Borg, MD     ROS: The patient denies fevers, chills, night sweats, unintentional weight loss, chest pain, palpitations, wheezing, dyspnea on exertion, nausea, vomiting, abdominal pain, dysuria, hematuria, melena  All other systems have been reviewed and were otherwise negative with the exception of those mentioned in the HPI and as above.    PHYSICAL EXAM: Filed Vitals:   02/20/15 1040  BP: 156/70  Pulse: 88  Temp: 98.7 F (37.1 C)  Resp: 16   Filed Vitals:    02/20/15 1040  Height: 4' 10.5" (1.486 m)  Weight: 151 lb 9.6 oz (68.765 kg)   Body mass index is 31.14 kg/(m^2).   General: Alert, no acute distress HEENT:  Normocephalic, atraumatic, oropharynx patent. EOMI, PERRLA Cardiovascular:  Regular rate and rhythm, no rubs murmurs or gallops.  No Carotid bruits, radial pulse intact. No pedal edema.  Respiratory: Clear to auscultation bilaterally.  No wheezes, rales, or rhonchi.  No cyanosis, no use of accessory musculature GI: No organomegaly, abdomen is soft and non-tender, positive bowel sounds.  No masses. Skin: Has erythema of the hand, tenderness, warmth. Neurologic: Facial musculature symmetric. Psychiatric: Patient is appropriate throughout our interaction. Lymphatic: No cervical lymphadenopathy Musculoskeletal: Gait intact. Trace range of motion secondary to pain. There is minimal swelling. There is warmth, erythema of the wrist and dorsum of left hand 5 out of 5 strength grip wise, sensation intact.   LABS: Results for orders placed or performed in visit on 02/20/15  POCT CBC  Result Value Ref Range   WBC 9.9 4.6 - 10.2 K/uL   Lymph, poc 2.3 0.6 - 3.4   POC LYMPH PERCENT 23.2 10 - 50 %L   MID (cbc) 0.1 0 - 0.9   POC MID % 0.8 0 - 12 %M   POC Granulocyte 7.5 (A) 2 - 6.9   Granulocyte percent 76.0 37 - 80 %G   RBC 4.82 4.04 - 5.48 M/uL   Hemoglobin 13.3 12.2 - 16.2 g/dL   HCT, POC 42.7 37.7 - 47.9 %   MCV 88.6 80 - 97 fL   MCH, POC 27.6 27 - 31.2 pg   MCHC 31.2 (A) 31.8 - 35.4 g/dL   RDW, POC 14.9 %   Platelet Count, POC 412.0 142 - 424 K/uL   MPV 7.3 0 - 99.8 fL     EKG/XRAY:   Primary read interpreted by Dr. Marin Comment at [facility]. No acute fracture or dislocation however there is some joint space haziness along distal radius and ulna and metacarpal bones    ASSESSMENT/PLAN: Encounter Diagnoses  Name Primary?  . Wrist pain, acute, right Yes  . History of gout   . Redness of skin   . Swelling of hand joint, right    Mrs.  Renee Mann is a pleasant 59 year old African-American female with type 2 diabetes who presents with a worsening right wrist and hand pain, redness, swelling. She has a history of gout as well. This still similar to it but it has never been this bad in the swelling and pain has  worsened. She was given a steroidal injection several days ago and that did not give her any relief. X-ray shows no fracture or dislocation, but there is some haziness indicative of arthritis in the intra-articular space between the distal radius/ulna and metacarpal bones.. Prescribed Keflex for possible infection, she has a white count that is on the upper limits of normal. This may be related to this during injection she received or is early worsening cellulitis. Prescribed colchicine for cute gout flareup Prescribed Roxicet for pain she has had oxycodone in the past Labs pending:cmp and uric acid Follow-up as needed   Gross sideeffects, risk and benefits, and alternatives of medications d/w patient. Patient is aware that all medications have potential sideeffects and we are unable to predict every sideeffect or drug-drug interaction that may occur.  Ziyonna Christner, [place], DO 02/20/2015 3:34 PM

## 2015-03-08 ENCOUNTER — Telehealth: Payer: Self-pay | Admitting: Internal Medicine

## 2015-03-08 DIAGNOSIS — Z0000 Encounter for general adult medical examination without abnormal findings: Secondary | ICD-10-CM

## 2015-03-08 DIAGNOSIS — I10 Essential (primary) hypertension: Secondary | ICD-10-CM

## 2015-03-08 DIAGNOSIS — E119 Type 2 diabetes mellitus without complications: Secondary | ICD-10-CM

## 2015-03-08 MED ORDER — METFORMIN HCL 500 MG PO TABS: ORAL_TABLET | ORAL | Status: DC

## 2015-03-08 NOTE — Telephone Encounter (Signed)
Patient is requesting metformin to be sent to [facility] at [facility].  States [facility] was to have faxed request in yesterday.  States needs sent today if possible.

## 2015-03-08 NOTE — Telephone Encounter (Signed)
Rf sent. Pt needs OV with PCP. Last OV was 02/2014.

## 2015-03-11 NOTE — Telephone Encounter (Signed)
i have scheduled patient for 7/27 for cpe.  Patient would like to have labs entered early if possible so she can go that morning.

## 2015-03-11 NOTE — Telephone Encounter (Signed)
Labs entered. Pt informed

## 2015-03-14 ENCOUNTER — Other Ambulatory Visit (INDEPENDENT_AMBULATORY_CARE_PROVIDER_SITE_OTHER): Payer: Commercial Managed Care - HMO

## 2015-03-14 DIAGNOSIS — E119 Type 2 diabetes mellitus without complications: Secondary | ICD-10-CM

## 2015-03-14 DIAGNOSIS — I10 Essential (primary) hypertension: Secondary | ICD-10-CM

## 2015-03-14 DIAGNOSIS — Z0000 Encounter for general adult medical examination without abnormal findings: Secondary | ICD-10-CM | POA: Diagnosis not present

## 2015-03-14 LAB — CBC WITH DIFFERENTIAL/PLATELET
Basophils Absolute: 0 10*3/uL (ref 0.0–0.1)
Basophils Relative: 0.5 % (ref 0.0–3.0)
EOS ABS: 0.2 10*3/uL (ref 0.0–0.7)
Eosinophils Relative: 3.1 % (ref 0.0–5.0)
HEMATOCRIT: 40.9 % (ref 36.0–46.0)
Hemoglobin: 13.3 g/dL (ref 12.0–15.0)
LYMPHS PCT: 49 % — AB (ref 12.0–46.0)
Lymphs Abs: 3.5 10*3/uL (ref 0.7–4.0)
MCHC: 32.4 g/dL (ref 30.0–36.0)
MCV: 90.1 fl (ref 78.0–100.0)
MONOS PCT: 7.3 % (ref 3.0–12.0)
Monocytes Absolute: 0.5 10*3/uL (ref 0.1–1.0)
Neutro Abs: 2.8 10*3/uL (ref 1.4–7.7)
Neutrophils Relative %: 40.1 % — ABNORMAL LOW (ref 43.0–77.0)
PLATELETS: 403 10*3/uL — AB (ref 150.0–400.0)
RBC: 4.54 Mil/uL (ref 3.87–5.11)
RDW: 14.5 % (ref 11.5–15.5)
WBC: 7.1 10*3/uL (ref 4.0–10.5)

## 2015-03-14 LAB — BASIC METABOLIC PANEL
BUN: 7 mg/dL (ref 6–23)
CALCIUM: 9.6 mg/dL (ref 8.4–10.5)
CO2: 28 mEq/L (ref 19–32)
CREATININE: 0.73 mg/dL (ref 0.40–1.20)
Chloride: 104 mEq/L (ref 96–112)
GFR: 104.38 mL/min (ref >60.00)
GLUCOSE: 102 mg/dL — AB (ref 70–99)
Potassium: 4 mEq/L (ref 3.5–5.1)
SODIUM: 141 meq/L (ref 135–145)

## 2015-03-14 LAB — TSH: TSH: 2.48 u[IU]/mL (ref 0.35–4.50)

## 2015-03-14 LAB — HEPATIC FUNCTION PANEL
ALT: 23 U/L (ref 0–35)
AST: 18 U/L (ref 0–37)
Albumin: 4.2 g/dL (ref 3.5–5.2)
Alkaline Phosphatase: 95 U/L (ref 39–117)
BILIRUBIN DIRECT: 0.1 mg/dL (ref 0.0–0.3)
BILIRUBIN TOTAL: 0.3 mg/dL (ref 0.2–1.2)
Total Protein: 7.5 g/dL (ref 6.0–8.3)

## 2015-03-14 LAB — LIPID PANEL
Cholesterol: 169 mg/dL (ref 0–200)
HDL: 39.7 mg/dL (ref >39.00)
LDL CALC: 113 mg/dL — AB (ref 0–99)
NONHDL: 129.3
TRIGLYCERIDES: 83 mg/dL (ref 0.0–149.0)
Total CHOL/HDL Ratio: 4
VLDL: 16.6 mg/dL (ref 0.0–40.0)

## 2015-03-14 LAB — URINALYSIS, ROUTINE W REFLEX MICROSCOPIC
Bilirubin Urine: NEGATIVE
HGB URINE DIPSTICK: NEGATIVE
KETONES UR: NEGATIVE
Leukocytes, UA: NEGATIVE
NITRITE: NEGATIVE
PH: 6 (ref 5.0–8.0)
RBC / HPF: NONE SEEN (ref 0–?)
Specific Gravity, Urine: 1.01 (ref 1.000–1.030)
TOTAL PROTEIN, URINE-UPE24: NEGATIVE
Urine Glucose: NEGATIVE
Urobilinogen, UA: 0.2 (ref 0.0–1.0)

## 2015-03-14 LAB — HEMOGLOBIN A1C: HEMOGLOBIN A1C: 7 % — AB (ref 4.6–6.5)

## 2015-03-20 ENCOUNTER — Ambulatory Visit (INDEPENDENT_AMBULATORY_CARE_PROVIDER_SITE_OTHER): Payer: Commercial Managed Care - HMO | Admitting: Internal Medicine

## 2015-03-20 ENCOUNTER — Encounter: Payer: Self-pay | Admitting: Internal Medicine

## 2015-03-20 VITALS — BP 138/80 | HR 96 | Ht 59.0 in | Wt 151.0 lb

## 2015-03-20 DIAGNOSIS — Z8639 Personal history of other endocrine, nutritional and metabolic disease: Secondary | ICD-10-CM | POA: Diagnosis not present

## 2015-03-20 DIAGNOSIS — K219 Gastro-esophageal reflux disease without esophagitis: Secondary | ICD-10-CM

## 2015-03-20 DIAGNOSIS — Z23 Encounter for immunization: Secondary | ICD-10-CM

## 2015-03-20 DIAGNOSIS — Z0000 Encounter for general adult medical examination without abnormal findings: Secondary | ICD-10-CM | POA: Diagnosis not present

## 2015-03-20 DIAGNOSIS — L539 Erythematous condition, unspecified: Secondary | ICD-10-CM

## 2015-03-20 DIAGNOSIS — E119 Type 2 diabetes mellitus without complications: Secondary | ICD-10-CM | POA: Diagnosis not present

## 2015-03-20 DIAGNOSIS — M25531 Pain in right wrist: Secondary | ICD-10-CM

## 2015-03-20 DIAGNOSIS — M1000 Idiopathic gout, unspecified site: Secondary | ICD-10-CM

## 2015-03-20 DIAGNOSIS — Z8739 Personal history of other diseases of the musculoskeletal system and connective tissue: Secondary | ICD-10-CM

## 2015-03-20 DIAGNOSIS — I10 Essential (primary) hypertension: Secondary | ICD-10-CM

## 2015-03-20 DIAGNOSIS — M25441 Effusion, right hand: Secondary | ICD-10-CM

## 2015-03-20 DIAGNOSIS — M109 Gout, unspecified: Secondary | ICD-10-CM | POA: Insufficient documentation

## 2015-03-20 MED ORDER — COLCHICINE 0.6 MG PO TABS: 0.6000 mg | ORAL_TABLET | Freq: Every day | ORAL | Status: DC

## 2015-03-20 MED ORDER — METFORMIN HCL 500 MG PO TABS: ORAL_TABLET | ORAL | Status: DC

## 2015-03-20 MED ORDER — AMLODIPINE BESYLATE 5 MG PO TABS: ORAL_TABLET | ORAL | Status: DC

## 2015-03-20 NOTE — Progress Notes (Signed)
Pre visit review using our clinic review tool, if applicable. No additional management support is needed unless otherwise documented below in the visit note. 

## 2015-03-20 NOTE — Assessment & Plan Note (Signed)
RUE Colchicine prn Labs

## 2015-03-20 NOTE — Assessment & Plan Note (Signed)
We discussed age appropriate health related issues, including available/recomended screening tests and vaccinations. We discussed a need for adhering to healthy diet and exercise. Labs/EKG were reviewed/ordered. All questions were answered. Colonoscopy PAP Eye exam q 1-2 years tDAP Elevated PLTs discussed

## 2015-03-20 NOTE — Progress Notes (Addendum)
Subjective:  Patient ID: Renee Mann, female    DOB: 06-18-1956  Age: 59 y.o. MRN: [id number]  CC: Annual Exam   HPI Renee Mann presents for a well exam. F/u chronic hypertension that has been well controlled with medicines; to address chronic hyperlipidemia controlled with medicines as well; and to address type 2 chronic diabetes, controlled with medical treatment and diet.    Outpatient Prescriptions Prior to Visit  Medication Sig Dispense Refill  . amLODipine (NORVASC) 5 MG tablet TAKE ONE TABLET BY MOUTH ONCE DAILY 30 tablet 11  . colchicine (COLCRYS) 0.6 MG tablet Take 1 tablet (0.6 mg total) by mouth daily. 30 tablet 0  . metaxalone (SKELAXIN) 800 MG tablet Take 1 tablet (800 mg total) by mouth 3 (three) times daily. 15 tablet 0  . metFORMIN (GLUCOPHAGE) 500 MG tablet TAKE ONE TABLET BY MOUTH ONCE DAILY WITH BREAKFAST 30 tablet 0  . oxyCODONE-acetaminophen (ROXICET) 5-325 MG per tablet Take 1 tablet by mouth every 8 (eight) hours as needed for severe pain. Has taken thsi before, no extra tylenol, take it with a stool softener 25 tablet 0  . cephALEXin (KEFLEX) 500 MG capsule Take 1 capsule (500 mg total) by mouth 4 (four) times daily. 40 capsule 0  . Cholecalciferol (VITAMIN D3) 1000 UNITS tablet Take 1,000 Units by mouth daily.       No facility-administered medications prior to visit.    ROS Review of Systems  Constitutional: Positive for unexpected weight change. Negative for chills, activity change, appetite change and fatigue.  HENT: Negative for congestion, mouth sores and sinus pressure.   Eyes: Negative for visual disturbance.  Respiratory: Negative for cough and chest tightness.   Gastrointestinal: Negative for nausea and abdominal pain.  Genitourinary: Negative for frequency, difficulty urinating and vaginal pain.  Musculoskeletal: Positive for arthralgias. Negative for back pain and gait problem.  Skin: Negative for pallor and rash.  Neurological: Negative for  dizziness, tremors, weakness, numbness and headaches.  Psychiatric/Behavioral: Negative for confusion and sleep disturbance.    Objective:  BP 138/80 mmHg  Pulse 96  Ht 4\' 11"  (1.499 m)  Wt 151 lb (68.493 kg)  BMI 30.48 kg/m2  SpO2 98%  BP Readings from Last 3 Encounters:  03/20/15 138/80  02/20/15 156/70  02/18/15 132/68    Wt Readings from Last 3 Encounters:  03/20/15 151 lb (68.493 kg)  02/20/15 151 lb 9.6 oz (68.765 kg)  02/18/15 152 lb (68.947 kg)    Physical Exam  Constitutional: She appears well-developed. No distress.  Obese  HENT:  Head: Normocephalic.  Right Ear: External ear normal.  Left Ear: External ear normal.  Nose: Nose normal.  Mouth/Throat: Oropharynx is clear and moist.  Eyes: Conjunctivae are normal. Pupils are equal, round, and reactive to light. Right eye exhibits no discharge. Left eye exhibits no discharge.  Neck: Normal range of motion. Neck supple. No JVD present. No tracheal deviation present. No thyromegaly present.  Cardiovascular: Normal rate, regular rhythm and normal heart sounds.   Pulmonary/Chest: No stridor. No respiratory distress. She has no wheezes.  Abdominal: Soft. Bowel sounds are normal. She exhibits no distension and no mass. There is no tenderness. There is no rebound and no guarding.  Musculoskeletal: She exhibits tenderness. She exhibits no edema.  Lymphadenopathy:    She has no cervical adenopathy.  Neurological: She displays normal reflexes. No cranial nerve deficit. She exhibits normal muscle tone. Coordination normal.  Skin: No rash noted. No erythema.  Psychiatric: She has  a normal mood and affect. Her behavior is normal. Judgment and thought content normal.  some joints hurt R shoulder, R wrist  Lab Results  Component Value Date   WBC 7.1 03/14/2015   HGB 13.3 03/14/2015   HCT 40.9 03/14/2015   PLT 403.0* 03/14/2015   GLUCOSE 102* 03/14/2015   CHOL 169 03/14/2015   TRIG 83.0 03/14/2015   HDL 39.70 03/14/2015     LDLCALC 113* 03/14/2015   ALT 23 03/14/2015   AST 18 03/14/2015   NA 141 03/14/2015   K 4.0 03/14/2015   CL 104 03/14/2015   CREATININE 0.73 03/14/2015   BUN 7 03/14/2015   CO2 28 03/14/2015   TSH 2.48 03/14/2015   HGBA1C 7.0* 03/14/2015    No results found.  Assessment & Plan:     Follow-up: No Follow-up on file.  Walker Kehr, MD

## 2015-03-20 NOTE — Assessment & Plan Note (Signed)
Zantac prn  

## 2015-03-20 NOTE — Addendum Note (Signed)
Addended by: Cresenciano Lick on: 03/20/2015 02:52 PM   Modules accepted: Orders

## 2015-03-20 NOTE — Assessment & Plan Note (Signed)
- 

## 2015-03-20 NOTE — Addendum Note (Signed)
Addended by: Cassandria Anger on: 03/20/2015 02:30 PM   Modules accepted: Level of Service, SmartSet

## 2015-04-01 ENCOUNTER — Encounter: Payer: Self-pay | Admitting: *Deleted

## 2016-03-19 ENCOUNTER — Other Ambulatory Visit: Payer: Self-pay | Admitting: Internal Medicine

## 2016-04-01 ENCOUNTER — Other Ambulatory Visit: Payer: Self-pay | Admitting: Internal Medicine

## 2016-04-13 ENCOUNTER — Encounter: Payer: Self-pay | Admitting: Gastroenterology

## 2016-04-17 ENCOUNTER — Other Ambulatory Visit (INDEPENDENT_AMBULATORY_CARE_PROVIDER_SITE_OTHER): Payer: Commercial Managed Care - HMO

## 2016-04-17 ENCOUNTER — Encounter: Payer: Self-pay | Admitting: Internal Medicine

## 2016-04-17 ENCOUNTER — Ambulatory Visit (INDEPENDENT_AMBULATORY_CARE_PROVIDER_SITE_OTHER): Payer: Commercial Managed Care - HMO | Admitting: Internal Medicine

## 2016-04-17 ENCOUNTER — Encounter: Payer: Commercial Managed Care - HMO | Admitting: Internal Medicine

## 2016-04-17 ENCOUNTER — Encounter: Payer: Self-pay | Admitting: Gastroenterology

## 2016-04-17 VITALS — BP 120/80 | HR 84 | Ht 59.0 in | Wt 152.0 lb

## 2016-04-17 DIAGNOSIS — D485 Neoplasm of uncertain behavior of skin: Secondary | ICD-10-CM

## 2016-04-17 DIAGNOSIS — E119 Type 2 diabetes mellitus without complications: Secondary | ICD-10-CM

## 2016-04-17 DIAGNOSIS — M10079 Idiopathic gout, unspecified ankle and foot: Secondary | ICD-10-CM

## 2016-04-17 DIAGNOSIS — Z1211 Encounter for screening for malignant neoplasm of colon: Secondary | ICD-10-CM

## 2016-04-17 DIAGNOSIS — Z0000 Encounter for general adult medical examination without abnormal findings: Secondary | ICD-10-CM

## 2016-04-17 LAB — CBC WITH DIFFERENTIAL/PLATELET
BASOS ABS: 0.1 10*3/uL (ref 0.0–0.1)
Basophils Relative: 0.7 % (ref 0.0–3.0)
Eosinophils Absolute: 0.3 10*3/uL (ref 0.0–0.7)
Eosinophils Relative: 3.6 % (ref 0.0–5.0)
HEMATOCRIT: 40 % (ref 36.0–46.0)
Hemoglobin: 13.3 g/dL (ref 12.0–15.0)
LYMPHS PCT: 36.9 % (ref 12.0–46.0)
Lymphs Abs: 2.7 10*3/uL (ref 0.7–4.0)
MCHC: 33.3 g/dL (ref 30.0–36.0)
MCV: 88.4 fl (ref 78.0–100.0)
MONOS PCT: 7.7 % (ref 3.0–12.0)
Monocytes Absolute: 0.6 10*3/uL (ref 0.1–1.0)
Neutro Abs: 3.7 10*3/uL (ref 1.4–7.7)
Neutrophils Relative %: 51.1 % (ref 43.0–77.0)
Platelets: 375 10*3/uL (ref 150.0–400.0)
RBC: 4.52 Mil/uL (ref 3.87–5.11)
RDW: 14.1 % (ref 11.5–15.5)
WBC: 7.3 10*3/uL (ref 4.0–10.5)

## 2016-04-17 LAB — URINALYSIS
Bilirubin Urine: NEGATIVE
Hgb urine dipstick: NEGATIVE
Ketones, ur: NEGATIVE
Leukocytes, UA: NEGATIVE
NITRITE: NEGATIVE
PH: 5.5 (ref 5.0–8.0)
SPECIFIC GRAVITY, URINE: 1.015 (ref 1.000–1.030)
TOTAL PROTEIN, URINE-UPE24: NEGATIVE
Urine Glucose: NEGATIVE
Urobilinogen, UA: 0.2 (ref 0.0–1.0)

## 2016-04-17 LAB — TSH: TSH: 1.2 u[IU]/mL (ref 0.35–4.50)

## 2016-04-17 LAB — HEPATIC FUNCTION PANEL
ALT: 23 U/L (ref 0–35)
AST: 18 U/L (ref 0–37)
Albumin: 4.5 g/dL (ref 3.5–5.2)
Alkaline Phosphatase: 104 U/L (ref 39–117)
BILIRUBIN DIRECT: 0.1 mg/dL (ref 0.0–0.3)
BILIRUBIN TOTAL: 0.4 mg/dL (ref 0.2–1.2)
Total Protein: 8 g/dL (ref 6.0–8.3)

## 2016-04-17 LAB — HEMOGLOBIN A1C: Hgb A1c MFr Bld: 7 % — ABNORMAL HIGH (ref 4.6–6.5)

## 2016-04-17 LAB — BASIC METABOLIC PANEL
BUN: 8 mg/dL (ref 6–23)
CALCIUM: 9.6 mg/dL (ref 8.4–10.5)
CO2: 29 mEq/L (ref 19–32)
Chloride: 103 mEq/L (ref 96–112)
Creatinine, Ser: 0.76 mg/dL (ref 0.40–1.20)
GFR: 99.28 mL/min (ref >60.00)
Glucose, Bld: 115 mg/dL — ABNORMAL HIGH (ref 70–99)
Potassium: 3.9 mEq/L (ref 3.5–5.1)
SODIUM: 141 meq/L (ref 135–145)

## 2016-04-17 LAB — MICROALBUMIN / CREATININE URINE RATIO
Creatinine,U: 72.3 mg/dL
MICROALB/CREAT RATIO: 1 mg/g (ref 0.0–30.0)
Microalb, Ur: <0.7 mg/dL (ref 0.0–1.9)

## 2016-04-17 LAB — LIPID PANEL
CHOL/HDL RATIO: 5
Cholesterol: 183 mg/dL (ref 0–200)
HDL: 40.3 mg/dL (ref >39.00)
LDL Cholesterol: 126 mg/dL — ABNORMAL HIGH (ref 0–99)
NONHDL: 142.45
Triglycerides: 84 mg/dL (ref 0.0–149.0)
VLDL: 16.8 mg/dL (ref 0.0–40.0)

## 2016-04-17 MED ORDER — AMLODIPINE BESYLATE 5 MG PO TABS: 5.0000 mg | ORAL_TABLET | Freq: Every day | ORAL | 11 refills | Status: DC

## 2016-04-17 MED ORDER — COLCHICINE 0.6 MG PO TABS: 0.6000 mg | ORAL_TABLET | Freq: Every day | ORAL | 11 refills | Status: DC

## 2016-04-17 MED ORDER — METFORMIN HCL 500 MG PO TABS: 500.0000 mg | ORAL_TABLET | Freq: Every day | ORAL | 11 refills | Status: DC

## 2016-04-17 NOTE — Progress Notes (Signed)
Subjective:  Patient ID: Renee Mann, female    DOB: Oct 26, 1955  Age: 60 y.o. MRN: [id number]  CC: Annual Exam   HPI Renee Mann presents for a well exam C/o a mole under R breast F/u diet, HTN  Outpatient Medications Prior to Visit  Medication Sig Dispense Refill  . amLODipine (NORVASC) 5 MG tablet Take 1 tablet (5 mg total) by mouth daily. TAKE ONE TABLET BY MOUTH ONCE DAILY---patient needs appt before any further refills 90 tablet 0  . colchicine (COLCRYS) 0.6 MG tablet Take 1 tablet (0.6 mg total) by mouth daily. 90 tablet 3  . metFORMIN (GLUCOPHAGE) 500 MG tablet Take 1 tablet (500 mg total) by mouth daily with breakfast. Overdue for yearly physical w/labs must see MD for refills 30 tablet 0  . Cholecalciferol (VITAMIN D3) 1000 UNITS tablet Take 1,000 Units by mouth daily.      . metaxalone (SKELAXIN) 800 MG tablet Take 1 tablet (800 mg total) by mouth 3 (three) times daily. (Patient not taking: Reported on 04/17/2016) 15 tablet 0  . oxyCODONE-acetaminophen (ROXICET) 5-325 MG per tablet Take 1 tablet by mouth every 8 (eight) hours as needed for severe pain. Has taken thsi before, no extra tylenol, take it with a stool softener (Patient not taking: Reported on 04/17/2016) 25 tablet 0   No facility-administered medications prior to visit.     ROS Review of Systems  Constitutional: Negative for activity change, appetite change, chills, fatigue and unexpected weight change.  HENT: Negative for congestion, mouth sores and sinus pressure.   Eyes: Negative for visual disturbance.  Respiratory: Negative for cough and chest tightness.   Gastrointestinal: Negative for abdominal pain and nausea.  Genitourinary: Negative for difficulty urinating, frequency and vaginal pain.  Musculoskeletal: Negative for back pain and gait problem.  Skin: Negative for pallor and rash.  Neurological: Negative for dizziness, tremors, weakness, numbness and headaches.  Psychiatric/Behavioral: Negative for  confusion and sleep disturbance.    Objective:  BP 120/80   Pulse 84   Ht 4\' 11"  (1.499 m)   Wt 152 lb (68.9 kg)   SpO2 97%   BMI 30.70 kg/m   BP Readings from Last 3 Encounters:  04/17/16 120/80  03/20/15 138/80  02/20/15 (!) 156/70    Wt Readings from Last 3 Encounters:  04/17/16 152 lb (68.9 kg)  03/20/15 151 lb (68.5 kg)  02/20/15 151 lb 9.6 oz (68.8 kg)    Physical Exam  Constitutional: She appears well-developed. No distress.  HENT:  Head: Normocephalic.  Right Ear: External ear normal.  Left Ear: External ear normal.  Nose: Nose normal.  Mouth/Throat: Oropharynx is clear and moist.  Eyes: Conjunctivae are normal. Pupils are equal, round, and reactive to light. Right eye exhibits no discharge. Left eye exhibits no discharge.  Neck: Normal range of motion. Neck supple. No JVD present. No tracheal deviation present. No thyromegaly present.  Cardiovascular: Normal rate, regular rhythm and normal heart sounds.   Pulmonary/Chest: No stridor. No respiratory distress. She has no wheezes.  Abdominal: Soft. Bowel sounds are normal. She exhibits no distension and no mass. There is no tenderness. There is no rebound and no guarding.  Musculoskeletal: She exhibits no edema or tenderness.  Lymphadenopathy:    She has no cervical adenopathy.  Neurological: She displays normal reflexes. No cranial nerve deficit. She exhibits normal muscle tone. Coordination normal.  Skin: No rash noted. No erythema.  Psychiatric: She has a normal mood and affect. Her behavior is normal.  Judgment and thought content normal.   6x4 mm mole under R breast Lab Results  Component Value Date   WBC 7.1 03/14/2015   HGB 13.3 03/14/2015   HCT 40.9 03/14/2015   PLT 403.0 (H) 03/14/2015   GLUCOSE 102 (H) 03/14/2015   CHOL 169 03/14/2015   TRIG 83.0 03/14/2015   HDL 39.70 03/14/2015   LDLCALC 113 (H) 03/14/2015   ALT 23 03/14/2015   AST 18 03/14/2015   NA 141 03/14/2015   K 4.0 03/14/2015   CL  104 03/14/2015   CREATININE 0.73 03/14/2015   BUN 7 03/14/2015   CO2 28 03/14/2015   TSH 2.48 03/14/2015   HGBA1C 7.0 (H) 03/14/2015    No results found.  Assessment & Plan:   There are no diagnoses linked to this encounter. I am having Renee Mann maintain her cholecalciferol, metaxalone, oxyCODONE-acetaminophen, colchicine, amLODipine, and metFORMIN.  No orders of the defined types were placed in this encounter.    Follow-up: No Follow-up on file.  Walker Kehr, MD

## 2016-04-17 NOTE — Assessment & Plan Note (Signed)
On Metformin 

## 2016-04-17 NOTE — Assessment & Plan Note (Signed)
6x4 mm mole under R breast Skin bx

## 2016-04-17 NOTE — Assessment & Plan Note (Signed)
No relapse Allopurinol 

## 2016-04-17 NOTE — Assessment & Plan Note (Signed)
We discussed age appropriate health related issues, including available/recomended screening tests and vaccinations. We discussed a need for adhering to healthy diet and exercise. Labs/EKG were reviewed/ordered. All questions were answered. Declined shots Mammo q 12 mo Colon due 2017

## 2016-04-17 NOTE — Progress Notes (Signed)
Pre visit review using our clinic review tool, if applicable. No additional management support is needed unless otherwise documented below in the visit note. 

## 2016-04-18 LAB — HEPATITIS C ANTIBODY: HCV Ab: NEGATIVE

## 2016-04-23 ENCOUNTER — Ambulatory Visit (INDEPENDENT_AMBULATORY_CARE_PROVIDER_SITE_OTHER): Payer: Commercial Managed Care - HMO | Admitting: Physician Assistant

## 2016-04-23 VITALS — BP 126/78 | HR 110 | Temp 98.5°F | Resp 18 | Ht 59.0 in | Wt 153.0 lb

## 2016-04-23 DIAGNOSIS — L298 Other pruritus: Secondary | ICD-10-CM | POA: Diagnosis not present

## 2016-04-23 DIAGNOSIS — L299 Pruritus, unspecified: Secondary | ICD-10-CM | POA: Diagnosis not present

## 2016-04-23 DIAGNOSIS — N898 Other specified noninflammatory disorders of vagina: Secondary | ICD-10-CM

## 2016-04-23 LAB — POCT WET + KOH PREP
TRICH BY WET PREP: ABSENT
YEAST BY WET PREP: ABSENT
Yeast by KOH: ABSENT

## 2016-04-23 MED ORDER — CETIRIZINE HCL 10 MG PO TABS
10.0000 mg | ORAL_TABLET | Freq: Once | ORAL | Status: AC
  Administered 2016-04-23: 10 mg via ORAL

## 2016-04-23 MED ORDER — CETIRIZINE HCL 10 MG PO TABS: 10.0000 mg | ORAL_TABLET | Freq: Every day | ORAL | Status: DC

## 2016-04-23 MED ORDER — METHYLPREDNISOLONE ACETATE 80 MG/ML IJ SUSP
40.0000 mg | Freq: Once | INTRAMUSCULAR | Status: AC
  Administered 2016-04-23: 40 mg via INTRAMUSCULAR

## 2016-04-23 MED ORDER — CETIRIZINE HCL 10 MG PO TABS: 10.0000 mg | ORAL_TABLET | Freq: Every day | ORAL | 0 refills | Status: DC

## 2016-04-23 MED ORDER — METHYLPREDNISOLONE ACETATE 40 MG/ML IJ SUSP: 40.0000 mg | Freq: Once | INTRAMUSCULAR | Status: DC

## 2016-04-23 NOTE — Patient Instructions (Signed)
     IF you received an x-ray today, you will receive an invoice from [facility]. Please contact [facility] at [phone] with questions or concerns regarding your invoice.   IF you received labwork today, you will receive an invoice from [facility]. Please contact [facility] at [phone] with questions or concerns regarding your invoice.   Our billing staff will not be able to assist you with questions regarding bills from these companies.  You will be contacted with the lab results as soon as they are available. The fastest way to get your results is to activate your My Chart account. Instructions are located on the last page of this paperwork. If you have not heard from us regarding the results in 2 weeks, please contact this office.      

## 2016-04-23 NOTE — Progress Notes (Signed)
04/24/2016 8:48 AM   DOB: 04-15-1956 / MRN: [id number]  SUBJECTIVE:  Renee Mann is a 60 y.o. female presenting for itching that started in her vagina and has now spread "all over her body." She denies any rash.  This has happened one time before and states "I got a shot in the office and was better in a day." She would like to try that again.  She just saw her PCP and all of her labs were normal for her.  She denies any medication changes.   She is allergic to codeine; atenolol; codeine sulfate; and hydrochlorothiazide w-triamterene.   She  has a past medical history of Diabetes mellitus (2008); Gastroesophageal reflux disease; Hives; Hypertension; Osteoarthritis; and Palsy ([place]).    She  reports that she quit smoking about 17 years ago. She has never used smokeless tobacco. She reports that she does not drink alcohol or use drugs. She  reports that she currently engages in sexual activity. The patient  has a past surgical history that includes Abdominal hysterectomy and burritis surgery (1998).  Her family history includes Cancer in her mother; Hypertension in her mother and sister; Stroke in her mother.  Review of Systems  Constitutional: Negative for chills and fever.  Respiratory: Negative for cough.   Gastrointestinal: Negative for nausea.  Skin: Positive for itching. Negative for rash.  Neurological: Negative for dizziness, tingling and headaches.    The problem list and medications were reviewed and updated by myself where necessary and exist elsewhere in the encounter.   OBJECTIVE:  BP 126/78 (BP Location: Right Arm, Patient Position: Sitting, Cuff Size: Small)   Pulse (!) 110   Temp 98.5 F (36.9 C) (Oral)   Resp 18   Ht 4\' 11"  (1.499 m)   Wt 153 lb (69.4 kg)   SpO2 99%   BMI 30.90 kg/m   Physical Exam  Constitutional: She is oriented to person, place, and time.  Cardiovascular: Normal rate and regular rhythm.   Pulmonary/Chest: Effort normal and breath sounds  normal.  Musculoskeletal: Normal range of motion.  Neurological: She is alert and oriented to person, place, and time.  Skin: Skin is warm and dry. No rash noted.  Psychiatric: She has a normal mood and affect.    Lab Results  Component Value Date   CREATININE 0.76 04/17/2016   Lab Results  Component Value Date   ALT 23 04/17/2016   AST 18 04/17/2016   ALKPHOS 104 04/17/2016   BILITOT 0.4 04/17/2016   Lab Results  Component Value Date   WBC 7.3 04/17/2016   HGB 13.3 04/17/2016   HCT 40.0 04/17/2016   MCV 88.4 04/17/2016   PLT 375.0 04/17/2016       Results for orders placed or performed in visit on 04/23/16 (from the past 72 hour(s))  POCT Wet + KOH Prep     Status: None   Collection Time: 04/23/16  5:45 PM  Result Value Ref Range   Yeast by KOH Absent Present, Absent   Yeast by wet prep Absent Present, Absent   WBC by wet prep None None, Few, Too numerous to count   Clue Cells Wet Prep HPF POC None None, Too numerous to count   Trich by wet prep Absent Present, Absent   Bacteria Wet Prep HPF POC Few None, Few, Too numerous to count   Epithelial Cells By [employer] Pref ([employer]) Few None, Few, Too numerous to count   RBC,UR,HPF,POC None None RBC/hpf    No  results found.  ASSESSMENT AND PLAN  Renee Mann was seen today for other.  Diagnoses and all orders for this visit:  Itching in the vaginal area: I can not account for her itching.  Blood work current and negative for a clear cause.  This may be a food allergy.  Will treat with depomedrol as this has been helpful in the past.  Zyrtec given here and RX'd for home.  RTC as needed.  -     POCT Wet + KOH Prep  Pruritic dermatitis -     Discontinue: cetirizine (ZYRTEC) tablet 10 mg; Take 1 tablet (10 mg total) by mouth daily. -     Discontinue: methylPREDNISolone acetate (DEPO-MEDROL) injection 40 mg; Inject 1 mL (40 mg total) into the muscle once. -     cetirizine (ZYRTEC) 10 MG tablet; Take 1 tablet (10 mg total) by mouth  daily. -     cetirizine (ZYRTEC) tablet 10 mg; Take 1 tablet (10 mg total) by mouth once. -     methylPREDNISolone acetate (DEPO-MEDROL) injection 40 mg; Inject 0.5 mLs (40 mg total) into the muscle once.    The patient is advised to call or return to clinic if she does not see an improvement in symptoms, or to seek the care of the closest emergency department if she worsens with the above plan.   Philis Fendt, MHS, PA-C [facility] 04/24/2016 8:48 AM

## 2016-05-20 LAB — HM MAMMOGRAPHY: HM Mammogram: NORMAL (ref 0–4)

## 2016-05-23 ENCOUNTER — Ambulatory Visit (INDEPENDENT_AMBULATORY_CARE_PROVIDER_SITE_OTHER): Payer: Commercial Managed Care - HMO | Admitting: Family Medicine

## 2016-05-23 VITALS — BP 132/72 | HR 103 | Temp 98.2°F | Resp 16 | Ht <= 58 in | Wt 146.4 lb

## 2016-05-23 DIAGNOSIS — N760 Acute vaginitis: Secondary | ICD-10-CM

## 2016-05-23 DIAGNOSIS — L308 Other specified dermatitis: Secondary | ICD-10-CM

## 2016-05-23 DIAGNOSIS — L299 Pruritus, unspecified: Secondary | ICD-10-CM | POA: Diagnosis not present

## 2016-05-23 DIAGNOSIS — L209 Atopic dermatitis, unspecified: Secondary | ICD-10-CM | POA: Diagnosis not present

## 2016-05-23 LAB — CBC WITH DIFFERENTIAL/PLATELET
BASOS ABS: 0 {cells}/uL (ref 0–200)
Basophils Relative: 0 %
EOS ABS: 581 {cells}/uL — AB (ref 15–500)
Eosinophils Relative: 7 %
HEMATOCRIT: 42.8 % (ref 35.0–45.0)
Hemoglobin: 14.1 g/dL (ref 11.7–15.5)
LYMPHS PCT: 32 %
Lymphs Abs: 2656 cells/uL (ref 850–3900)
MCH: 29.1 pg (ref 27.0–33.0)
MCHC: 32.9 g/dL (ref 32.0–36.0)
MCV: 88.4 fL (ref 80.0–100.0)
MONO ABS: 581 {cells}/uL (ref 200–950)
MONOS PCT: 7 %
MPV: 9.3 fL (ref 7.5–12.5)
Neutro Abs: 4482 cells/uL (ref 1500–7800)
Neutrophils Relative %: 54 %
PLATELETS: 472 10*3/uL — AB (ref 140–400)
RBC: 4.84 MIL/uL (ref 3.80–5.10)
RDW: 13.8 % (ref 11.0–15.0)
WBC: 8.3 10*3/uL (ref 3.8–10.8)

## 2016-05-23 LAB — POCT WET + KOH PREP
Trich by wet prep: ABSENT
Yeast by KOH: ABSENT
Yeast by wet prep: ABSENT

## 2016-05-23 MED ORDER — CLOTRIMAZOLE-BETAMETHASONE 1-0.05 % EX CREA: 1.0000 "application " | TOPICAL_CREAM | Freq: Two times a day (BID) | CUTANEOUS | 0 refills | Status: DC

## 2016-05-23 MED ORDER — HYDROXYZINE HCL 25 MG PO TABS: 25.0000 mg | ORAL_TABLET | ORAL | 0 refills | Status: DC | PRN

## 2016-05-23 MED ORDER — TRIAMCINOLONE 0.1 % CREAM:EUCERIN CREAM 1:1: 1.0000 "application " | TOPICAL_CREAM | Freq: Two times a day (BID) | CUTANEOUS | 2 refills | Status: DC

## 2016-05-23 NOTE — Patient Instructions (Addendum)
Take the cetirizine you were prescribed every night before bed. If you think this is still making you sleepy in the morning than you can switch to a loratadine (claritin) or allegra.   Take cold or lukewarm showers, avoid baths.  Try not to shower but every 2-3d - water dries the skin and will likely make this worse.  Dry immediately after getting out of the shower and put on the triamcinolone/eucerin lotion immediately all over your trunk and especially your sides, low back, and pelvic area. Make sure you are only using hypoallergenic products without fragrances or color additives. You can apply the lotrisone to the external pelvic skin - where you have hair. Then take the hydroxyzine as needed during the day for itching. It could be a little sedating but most people do just fine.  After this resolves, make sure you are using 1-2x/daily good hypoallergenic thick moisturizer cream such as Eucerin, Cedaphil, or Aquaphor and apply this continually twice a day - especially immediately after showering to prevent it from coming back.    Avoiding exposure to irritants (eg, detergents, solvents, hair lotions or dyes, acidic foods [eg, citrus fruit])     IF you received an x-ray today, you will receive an invoice from [facility]. Please contact [facility] at [phone] with questions or concerns regarding your invoice.   IF you received labwork today, you will receive an invoice from [employer]. Please contact [facility] at [phone] with questions or concerns regarding your invoice.   Our billing staff will not be able to assist you with questions regarding bills from these companies.  You will be contacted with the lab results as soon as they are available. The fastest way to get your results is to activate your My Chart account. Instructions are located on the last page of this paperwork. If you have not heard from [place] regarding the results in 2 weeks,  please contact this office.     Eczema Eczema, also called atopic dermatitis, is a skin disorder that causes inflammation of the skin. It causes a red rash and dry, scaly skin. The skin becomes very itchy. Eczema is generally worse during the cooler winter months and often improves with the warmth of summer. Eczema usually starts showing signs in infancy. Some children outgrow eczema, but it may last through adulthood.  CAUSES  The exact cause of eczema is not known, but it appears to run in families. People with eczema often have a family history of eczema, allergies, asthma, or hay fever. Eczema is not contagious. Flare-ups of the condition may be caused by:   Contact with something you are sensitive or allergic to.   Stress. SIGNS AND SYMPTOMS  Dry, scaly skin.   Red, itchy rash.   Itchiness. This may occur before the skin rash and may be very intense.  DIAGNOSIS  The diagnosis of eczema is usually made based on symptoms and medical history. TREATMENT  Eczema cannot be cured, but symptoms usually can be controlled with treatment and other strategies. A treatment plan might include:  Controlling the itching and scratching.   Use over-the-counter antihistamines as directed for itching. This is especially useful at night when the itching tends to be worse.   Use over-the-counter steroid creams as directed for itching.   Avoid scratching. Scratching makes the rash and itching worse. It may also result in a skin infection (impetigo) due to a break in the skin caused by scratching.   Keeping the skin well  moisturized with creams every day. This will seal in moisture and help prevent dryness. Lotions that contain alcohol and water should be avoided because they can dry the skin.   Limiting exposure to things that you are sensitive or allergic to (allergens).   Recognizing situations that cause stress.   Developing a plan to manage stress.  HOME CARE INSTRUCTIONS    Only take over-the-counter or prescription medicines as directed by your health care provider.   Do not use anything on the skin without checking with your health care provider.   Keep baths or showers short (5 minutes) in warm (not hot) water. Use mild cleansers for bathing. These should be unscented. You may add nonperfumed bath oil to the bath water. It is best to avoid soap and bubble bath.   Immediately after a bath or shower, when the skin is still damp, apply a moisturizing ointment to the entire body. This ointment should be a petroleum ointment. This will seal in moisture and help prevent dryness. The thicker the ointment, the better. These should be unscented.   Keep fingernails cut short. Children with eczema may need to wear soft gloves or mittens at night after applying an ointment.   Dress in clothes made of cotton or cotton blends. Dress lightly, because heat increases itching.   A child with eczema should stay away from anyone with fever blisters or cold sores. The virus that causes fever blisters (herpes simplex) can cause a serious skin infection in children with eczema. SEEK MEDICAL CARE IF:   Your itching interferes with sleep.   Your rash gets worse or is not better within 1 week after starting treatment.   You see pus or soft yellow scabs in the rash area.   You have a fever.   You have a rash flare-up after contact with someone who has fever blisters.    This information is not intended to replace advice given to you by your health care provider. Make sure you discuss any questions you have with your health care provider.   Document Released: 08/07/2000 Document Revised: 05/31/2013 Document Reviewed: 03/13/2013 Elsevier Interactive Patient Education [employer].

## 2016-05-23 NOTE — Progress Notes (Signed)
By signing my name below I, Tereasa Coop, attest that this documentation has been prepared under the direction and in the presence of Delman Cheadle, MD. Electonically Signed. Tereasa Coop, Scribe 05/23/2016 at 12:21 PM  Subjective:    Patient ID: Renee Mann, female    DOB: Dec 26, 1955, 60 y.o.   MRN: [id number]  Chief Complaint  Patient presents with  . Rash    now itching all over  . Vaginal Itching    HPI Renee Mann is a 60 y.o. female who presents to the [facility] complaining of diffuse pruritic rash for the past month. Pt also reports vaginal itching and burning for the past month. Itching worse at night. Pt has been sitting on ice prior to going to bed because it helps relieve symptoms. Pt is using a hydrocortisone cream 4 times a day for the past few weeks to control symptoms. Pt denies vaginal discharge, vaginal bleeding, vaginal pain, or vaginal odor. Pt has taken benadryl which provides moderate relief from the pruritis. Symptoms also improved by taking a hot shower. Pt has a pet maltese at home. Pt uses dove for sensitive skin soap at home.   Pt was seen by Carlis Abbott one month prior for same complaint, unable to identify etiology at that time, possible food allergy. At that time, pt reported similar episode prior to that resolved after steroid injection. Pt treated with DepoMedrol injection and zyrtec. Symptoms not resolved with treatment. Pt has not been taking the zyrtec because it makes her sleepy.  Pt has also been seen by her Gynecologist since then who told the pt they could not find anything acute or abnormal. Pt had normal blood work at that time.   Patient Active Problem List   Diagnosis Date Noted  . Neoplasm of uncertain behavior of skin 04/17/2016  . Gout 03/20/2015  . Well adult exam 03/20/2015  . GERD 07/16/2010  . OSTEOARTHRITIS 03/27/2008  . INSOMNIA, PERSISTENT 07/13/2007  . HYPERTENSION 07/13/2007  . Diabetes mellitus, type II ([place])  06/20/2007    Current Outpatient Prescriptions on File Prior to Visit  Medication Sig Dispense Refill  . amLODipine (NORVASC) 5 MG tablet Take 1 tablet (5 mg total) by mouth daily. 30 tablet 11  . cetirizine (ZYRTEC) 10 MG tablet Take 1 tablet (10 mg total) by mouth daily. 30 tablet 0  . Cholecalciferol (VITAMIN D3) 1000 UNITS tablet Take 1,000 Units by mouth daily.      . colchicine (COLCRYS) 0.6 MG tablet Take 1 tablet (0.6 mg total) by mouth daily. 30 tablet 11  . metFORMIN (GLUCOPHAGE) 500 MG tablet Take 1 tablet (500 mg total) by mouth daily with breakfast. Overdue for yearly physical w/labs must see MD for refills 30 tablet 11   No current facility-administered medications on file prior to visit.     Allergies  Allergen Reactions  . Codeine Hives  . Atenolol     REACTION: WEIRD FEELINGS  . Codeine Sulfate   . Hydrochlorothiazide W-Triamterene     REACTION: H/A    Depression screen [facility] 2/9 05/23/2016 04/23/2016 02/20/2015 02/18/2015 01/12/2015  Decreased Interest 0 0 0 0 0  Down, Depressed, Hopeless 0 0 0 0 0  PHQ - 2 Score 0 0 0 0 0       Review of Systems  Constitutional: Negative.   HENT: Negative.   Eyes: Negative.   Respiratory: Negative.   Cardiovascular: Negative.   Gastrointestinal: Negative.   Genitourinary: Negative.  Negative for  vaginal bleeding, vaginal discharge and vaginal pain.       Pt is positive for vaginal itching  Musculoskeletal: Negative.   Skin: Positive for rash (diffuse, pruritic).  Neurological: Negative.   Psychiatric/Behavioral: Negative.        Objective:   Physical Exam  Constitutional: She is oriented to person, place, and time. She appears well-developed and well-nourished. No distress.  HENT:  Head: Normocephalic and atraumatic.  Eyes: Conjunctivae are normal. Pupils are equal, round, and reactive to light.  Neck: Neck supple.  Cardiovascular: Normal rate.   Pulmonary/Chest: Effort normal.  Musculoskeletal: Normal range of  motion.  Neurological: She is alert and oriented to person, place, and time.  Skin: Skin is warm and dry.  Psychiatric: She has a normal mood and affect. Her behavior is normal.  Nursing note and vitals reviewed.  BP 132/72 (BP Location: Right Arm, Patient Position: Sitting, Cuff Size: Normal)   Pulse (!) 103   Temp 98.2 F (36.8 C) (Oral)   Resp 16   Ht _0  (1.473 m)   Wt 146 lb 6.4 oz (66.4 kg)   SpO2 98%   BMI 30.60 kg/m   Results for orders placed or performed in visit on 05/23/16  CBC with Differential/Platelet  Result Value Ref Range   WBC 8.3 3.8 - 10.8 K/uL   RBC 4.84 3.80 - 5.10 MIL/uL   Hemoglobin 14.1 11.7 - 15.5 g/dL   HCT 42.8 35.0 - 45.0 %   MCV 88.4 80.0 - 100.0 fL   MCH 29.1 27.0 - 33.0 pg   MCHC 32.9 32.0 - 36.0 g/dL   RDW 13.8 11.0 - 15.0 %   Platelets 472 (H) 140 - 400 K/uL   MPV 9.3 7.5 - 12.5 fL   Neutro Abs 4,482 1,500 - 7,800 cells/uL   Lymphs Abs 2,656 850 - 3,900 cells/uL   Monocytes Absolute 581 200 - 950 cells/uL   Eosinophils Absolute 581 (H) 15 - 500 cells/uL   Basophils Absolute 0 0 - 200 cells/uL   Neutrophils Relative % 54 %   Lymphocytes Relative 32 %   Monocytes Relative 7 %   Eosinophils Relative 7 %   Basophils Relative 0 %   Smear Review Criteria for review not met   Sedimentation Rate  Result Value Ref Range   Sed Rate  0 - 30 mm/hr  C-reactive protein  Result Value Ref Range   CRP  <8.0 mg/L  POCT Wet + KOH Prep  Result Value Ref Range   Yeast by KOH Absent Present, Absent   Yeast by wet prep Absent Present, Absent   WBC by wet prep Few None, Few, Too numerous to count   Clue Cells Wet Prep HPF POC None None, Too numerous to count   Trich by wet prep Absent Present, Absent   Bacteria Wet Prep HPF POC Many (A) None, Few, Too numerous to count   Epithelial Cells By [employer] Pref ([employer]) Moderate (A) None, Few, Too numerous to count   RBC,UR,HPF,POC None None RBC/hpf       Assessment & Plan:   1. Pruritic dermatitis   2.  Vaginitis and vulvovaginitis   3. Atopic dermatitis   This is pt's 3rd visit (once here, once to gyn prior) for pruritis which seemed to mainly start on her perineum. No sig rash other than excoriations. She certainly has very dry skin still despite her use of hypoallergenic soaps and lotions so will cover for atopic dermatitis/tinea cruris on perineum with  lotrisone. Pt will take her second fluconazole as well today.  Use [id number] bid, cold/lukewarm showers sparingly, retry cetirizine qhs and atarax during day prn. Stop scratching. If doesn't respond, call for referral to derm or allergist.  Orders Placed This Encounter  Procedures  . CBC with Differential/Platelet  . Sedimentation Rate  . C-reactive protein  . POCT Wet + KOH Prep    Meds ordered this encounter  Medications  . estradiol (ESTRACE) 1 MG tablet    Sig: Take 1 mg by mouth daily.  . Triamcinolone Acetonide (TRIAMCINOLONE 0.1 % CREAM : EUCERIN) CREA    Sig: Apply 1 application topically 2 (two) times daily.    Dispense:  1 each    Refill:  2    Disp 1 large jar as using all over body  . clotrimazole-betamethasone (LOTRISONE) cream    Sig: Apply 1 application topically 2 (two) times daily. X 1 wk only    Dispense:  30 g    Refill:  0  . hydrOXYzine (ATARAX/VISTARIL) 25 MG tablet    Sig: Take 1 tablet (25 mg total) by mouth every 4 (four) hours as needed for itching.    Dispense:  60 tablet    Refill:  0    I personally performed the services described in this documentation, which was scribed in my presence. The recorded information has been reviewed and considered, and addended by me as needed.   Delman Cheadle, M.D.  [facility] [address] [place], [place] [address] [phone] phone [phone] fax  05/23/16 9:08 PM

## 2016-05-24 LAB — SEDIMENTATION RATE: Sed Rate: 6 mm/hr (ref 0–30)

## 2016-05-25 LAB — C-REACTIVE PROTEIN: CRP: 12.7 mg/L — ABNORMAL HIGH (ref <8.0)

## 2016-06-10 IMAGING — CR DG WRIST COMPLETE 3+V*R*
2 series · 2 of 2 positions shown · non-contrast
Comparison: None.

CLINICAL DATA: Right hand pain of uncertain etiology common no
known injury

EXAM:
RIGHT WRIST - COMPLETE 3+ VIEW; RIGHT HAND - COMPLETE 3+ VIEW

[PA]
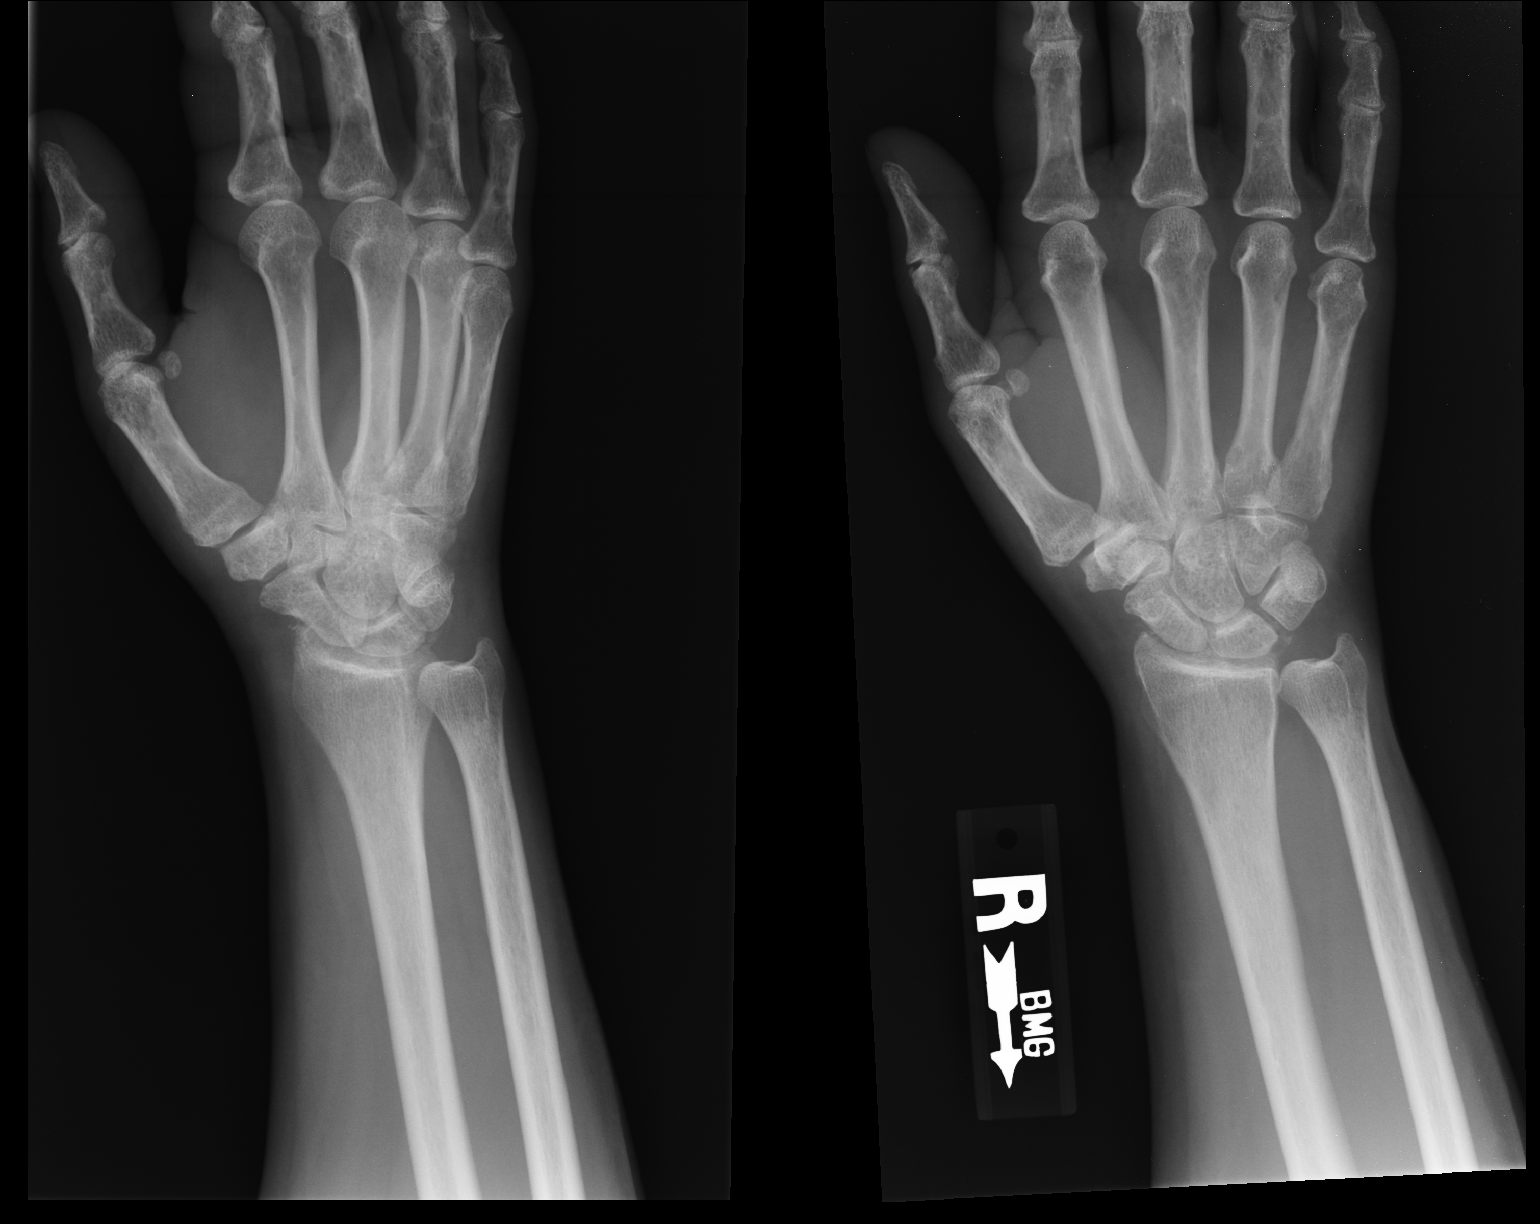

[lateral]
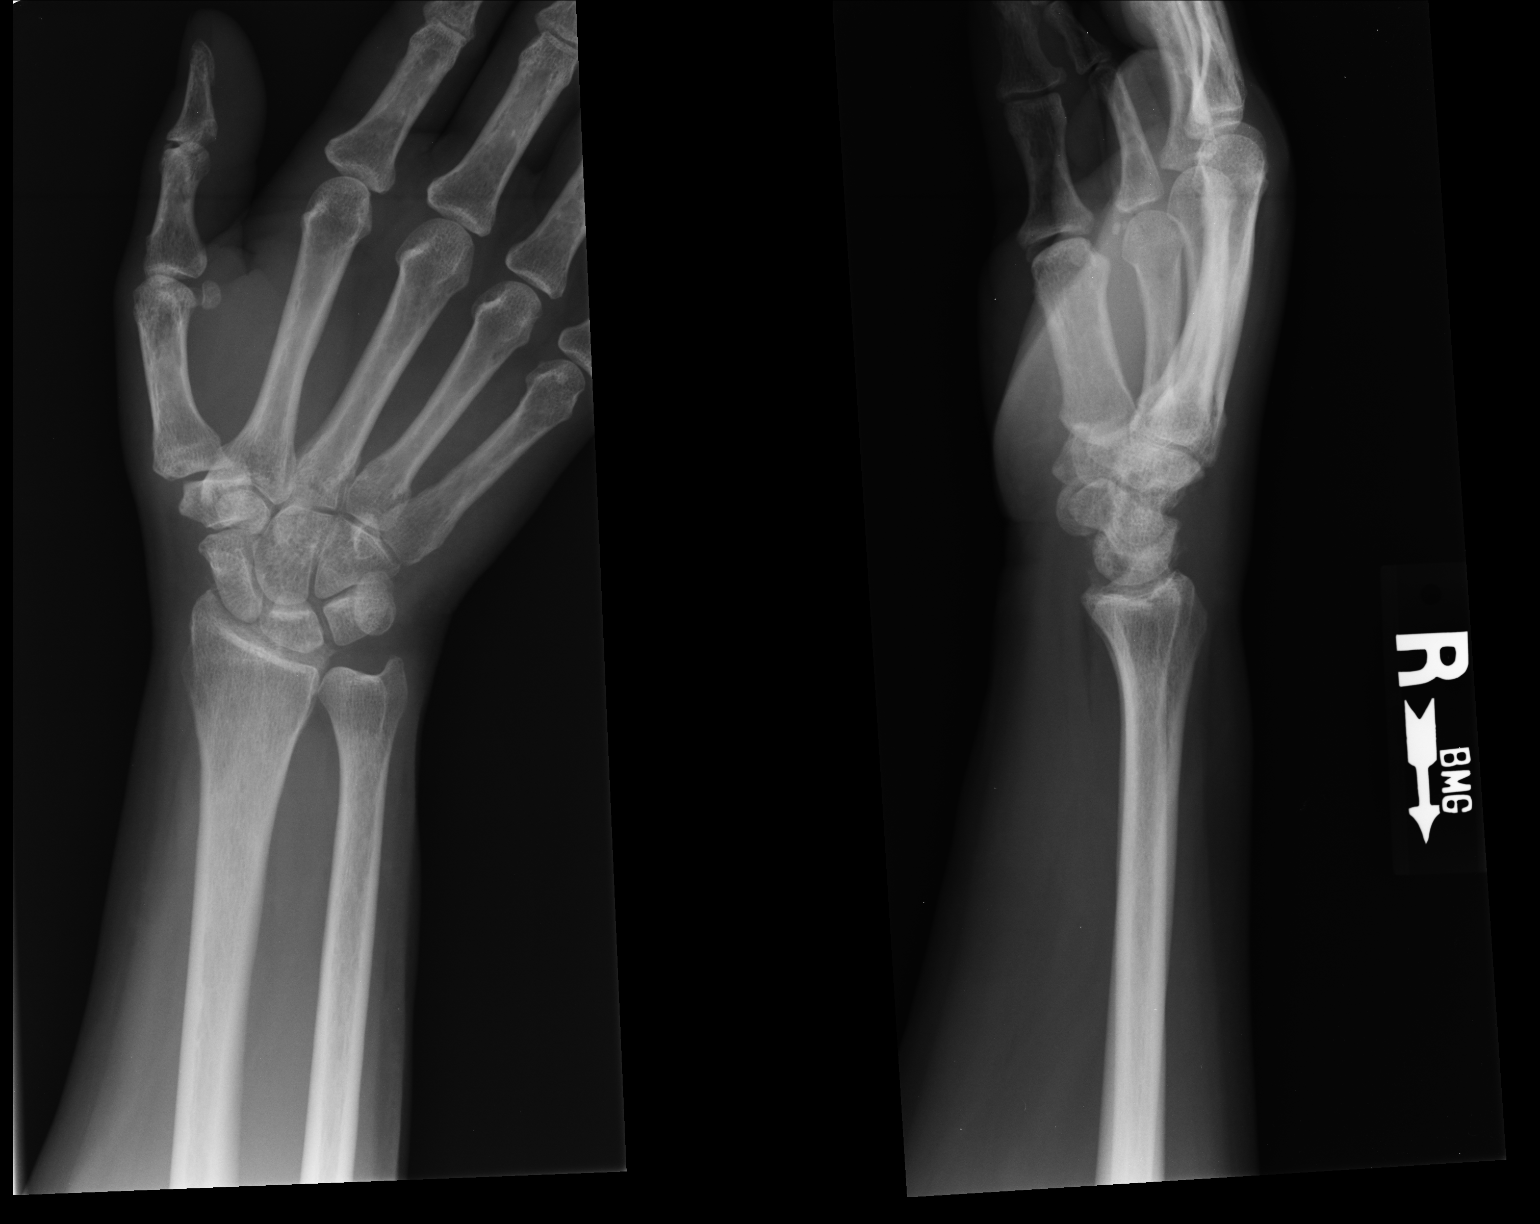

[2 of 2 positions shown; findings below may reference images not displayed]

FINDINGS: Right hand: The bones are adequately mineralized for age. The
interphalangeal, metacarpophalangeal, and carpometacarpal joints
exhibit no acute abnormality or significant degenerative change. The
phalanges and metacarpals are intact. The soft tissues are
unremarkable.

Right wrist: The carpal bones are intact. The radiocarpal,
intercarpal, and carpometacarpal joints are unremarkable. The soft
tissues are normal. There is minimal calcification in the region of
the triangular fibrocartilage.
IMPRESSION: There is no acute or significant chronic bony abnormality of the
right hand or right wrist.

## 2016-06-15 ENCOUNTER — Ambulatory Visit (AMBULATORY_SURGERY_CENTER): Payer: Self-pay | Admitting: *Deleted

## 2016-06-15 VITALS — Ht 59.0 in | Wt 149.4 lb

## 2016-06-15 DIAGNOSIS — Z1211 Encounter for screening for malignant neoplasm of colon: Secondary | ICD-10-CM

## 2016-06-15 MED ORDER — NA SULFATE-K SULFATE-MG SULF 17.5-3.13-1.6 GM/177ML PO SOLN: 1.0000 | Freq: Once | ORAL | 0 refills | Status: AC

## 2016-06-15 NOTE — Progress Notes (Signed)
No allergies to eggs or soy. No problems with anesthesia.  Pt given Emmi instructions for colonoscopy  No oxygen use  No diet drug use  

## 2016-06-17 ENCOUNTER — Encounter: Payer: Self-pay | Admitting: Gastroenterology

## 2016-06-29 ENCOUNTER — Ambulatory Visit (AMBULATORY_SURGERY_CENTER): Payer: Commercial Managed Care - HMO | Admitting: Gastroenterology

## 2016-06-29 ENCOUNTER — Encounter: Payer: Self-pay | Admitting: Gastroenterology

## 2016-06-29 VITALS — BP 146/80 | HR 68 | Temp 97.5°F | Resp 14 | Ht 59.0 in | Wt 149.0 lb

## 2016-06-29 DIAGNOSIS — Z1212 Encounter for screening for malignant neoplasm of rectum: Secondary | ICD-10-CM | POA: Diagnosis not present

## 2016-06-29 DIAGNOSIS — D122 Benign neoplasm of ascending colon: Secondary | ICD-10-CM

## 2016-06-29 DIAGNOSIS — K635 Polyp of colon: Secondary | ICD-10-CM

## 2016-06-29 DIAGNOSIS — Z1211 Encounter for screening for malignant neoplasm of colon: Secondary | ICD-10-CM | POA: Diagnosis present

## 2016-06-29 MED ORDER — SODIUM CHLORIDE 0.9 % IV SOLN: 500.0000 mL | INTRAVENOUS | Status: DC

## 2016-06-29 NOTE — Progress Notes (Signed)
Called to room for pathology. 

## 2016-06-29 NOTE — Patient Instructions (Signed)
Instructions/Recommendations:  Polyp handout given to patient. Diverticulosis handout given to patient. Hemorrhoid handout given to patient. Hemorrhoid banding handout given to patient.YOU HAD AN ENDOSCOPIC PROCEDURE TODAY AT [facility]:   Refer to the procedure report that was given to you for any specific questions about what was found during the examination.  If the procedure report does not answer your questions, please call your gastroenterologist to clarify.  If you requested that your care partner not be given the details of your procedure findings, then the procedure report has been included in a sealed envelope for you to review at your convenience later.  YOU SHOULD EXPECT: Some feelings of bloating in the abdomen. Passage of more gas than usual.  Walking can help get rid of the air that was put into your GI tract during the procedure and reduce the bloating. If you had a lower endoscopy (such as a colonoscopy or flexible sigmoidoscopy) you may notice spotting of blood in your stool or on the toilet paper. If you underwent a bowel prep for your procedure, you may not have a normal bowel movement for a few days.  Please Note:  You might notice some irritation and congestion in your nose or some drainage.  This is from the oxygen used during your procedure.  There is no need for concern and it should clear up in a day or so.  SYMPTOMS TO REPORT IMMEDIATELY:   Following lower endoscopy (colonoscopy or flexible sigmoidoscopy):  Excessive amounts of blood in the stool  Significant tenderness or worsening of abdominal pains  Swelling of the abdomen that is new, acute  Fever of 100F or higher   For urgent or emergent issues, a gastroenterologist can be reached at any hour by calling [phone].   DIET:  We do recommend a small meal at first, but then you may proceed to your regular diet.  Drink plenty of fluids but you should avoid alcoholic beverages for 24  hours.  ACTIVITY:  You should plan to take it easy for the rest of today and you should NOT DRIVE or use heavy machinery until tomorrow (because of the sedation medicines used during the test).    FOLLOW UP: Our staff will call the number listed on your records the next business day following your procedure to check on you and address any questions or concerns that you may have regarding the information given to you following your procedure. If we do not reach you, we will leave a message.  However, if you are feeling well and you are not experiencing any problems, there is no need to return our call.  We will assume that you have returned to your regular daily activities without incident.  If any biopsies were taken you will be contacted by phone or by letter within the next 1-3 weeks.  Please call us at [phone] if you have not heard about the biopsies in 3 weeks.    SIGNATURES/CONFIDENTIALITY: You and/or your care partner have signed paperwork which will be entered into your electronic medical record.  These signatures attest to the fact that that the information above on your After Visit Summary has been reviewed and is understood.  Full responsibility of the confidentiality of this discharge information lies with you and/or your care-partner.  No ibuprofen, naproxen, or other NSAIDS for 2 weeks.  Tylenol only.

## 2016-06-29 NOTE — Op Note (Signed)
[facility] Patient Name: Renee Mann Procedure Date: 06/29/2016 10:13 AM MRN: [id number] Endoscopist: Viviann Spare P. Asusena Sigley MD, MD Age: 60 Referring MD:  Date of Birth: 1956/07/05 Gender: Female Account #: [id number] Procedure:                Colonoscopy Indications:              Screening for malignant neoplasm in the colon Medicines:                Monitored Anesthesia Care Procedure:                Pre-Anesthesia Assessment:                           - Prior to the procedure, a History and Physical                            was performed, and patient medications and                            allergies were reviewed. The patient's tolerance of                            previous anesthesia was also reviewed. The risks                            and benefits of the procedure and the sedation                            options and risks were discussed with the patient.                            All questions were answered, and informed consent                            was obtained. Prior Anticoagulants: The patient has                            taken no previous anticoagulant or antiplatelet                            agents. ASA Grade Assessment: II - A patient with                            mild systemic disease. After reviewing the risks                            and benefits, the patient was deemed in                            satisfactory condition to undergo the procedure.                           After obtaining informed consent, the colonoscope  was passed under direct vision. Throughout the                            procedure, the patient's blood pressure, pulse, and                            oxygen saturations were monitored continuously. The                            Model PCF-H190DL [id number]) scope was introduced                            through the anus and advanced to the the cecum,   identified by appendiceal orifice and ileocecal                            valve. The colonoscopy was performed without                            difficulty. The patient tolerated the procedure                            well. The quality of the bowel preparation was                            good. The ileocecal valve, appendiceal orifice, and                            rectum were photographed. Scope In: 10:17:14 AM Scope Out: 10:31:14 AM Scope Withdrawal Time: 0 hours 10 minutes 30 seconds  Total Procedure Duration: 0 hours 14 minutes 0 seconds  Findings:                 The perianal and digital rectal examinations were                            normal.                           A 4 mm polyp was found in the ascending colon. The                            polyp was sessile. The polyp was removed with a                            cold snare. Resection and retrieval were complete.                           A few small-mouthed diverticula were found in the                            sigmoid colon.                           Non-bleeding internal hemorrhoids were found during  retroflexion.                           The exam was otherwise without abnormality. Complications:            No immediate complications. Estimated blood loss:                            Minimal. Estimated Blood Loss:     Estimated blood loss was minimal. Impression:               - One 4 mm polyp in the ascending colon, removed                            with a cold snare. Resected and retrieved.                           - Diverticulosis in the sigmoid colon.                           - Non-bleeding internal hemorrhoids.                           - The examination was otherwise normal. Recommendation:           - Patient has a contact number available for                            emergencies. The signs and symptoms of potential                            delayed complications were  discussed with the                            patient. Return to normal activities tomorrow.                            Written discharge instructions were provided to the                            patient.                           - Resume previous diet.                           - Continue present medications.                           - No ibuprofen, naproxen, or other non-steroidal                            anti-inflammatory drugs for 2 weeks after polyp                            removal.                           -  Await pathology results.                           - Repeat colonoscopy is recommended for                            surveillance. The colonoscopy date will be                            determined after pathology results from today's                            exam become available for review. Viviann Spare P. Bonner Larue MD, MD 06/29/2016 10:37:46 AM This report has been signed electronically.

## 2016-06-29 NOTE — Progress Notes (Signed)
To recovery, report to Sechler, RN, VSS 

## 2016-06-30 ENCOUNTER — Telehealth: Payer: Self-pay

## 2016-06-30 ENCOUNTER — Telehealth: Payer: Self-pay | Admitting: *Deleted

## 2016-06-30 NOTE — Telephone Encounter (Signed)
  Follow up Call-  Call back number 06/29/2016  Post procedure Call Back phone  # [phone]  Permission to leave phone message Yes  Some recent data might be hidden     Patient questions:  Do you have a fever, pain , or abdominal swelling? No. Pain Score  0 *  Have you tolerated food without any problems? Yes.    Have you been able to return to your normal activities? Yes.    Do you have any questions about your discharge instructions: Diet   No. Medications  No. Follow up visit  No.  Do you have questions or concerns about your Care? No.  Actions: * If pain score is 4 or above: No action needed, pain <4.

## 2016-06-30 NOTE — Telephone Encounter (Signed)
  Follow up Call-  Call back number 06/29/2016  Post procedure Call Back phone  # [phone]  Permission to leave phone message Yes  Some recent data might be hidden     Patient questions:  Do you have a fever, pain , or abdominal swelling? No. Pain Score  0 *  Have you tolerated food without any problems? Yes.    Have you been able to return to your normal activities? Yes.    Do you have any questions about your discharge instructions: Diet   No. Medications  No. Follow up visit  No.  Do you have questions or concerns about your Care? No.  Actions: * If pain score is 4 or above: No action needed, pain <4.

## 2016-07-03 ENCOUNTER — Encounter: Payer: Self-pay | Admitting: Gastroenterology

## 2016-07-17 ENCOUNTER — Ambulatory Visit (INDEPENDENT_AMBULATORY_CARE_PROVIDER_SITE_OTHER): Payer: Commercial Managed Care - HMO | Admitting: Physician Assistant

## 2016-07-17 VITALS — BP 140/80 | HR 101 | Temp 98.5°F | Resp 18 | Ht 59.0 in | Wt 151.0 lb

## 2016-07-17 DIAGNOSIS — J358 Other chronic diseases of tonsils and adenoids: Secondary | ICD-10-CM | POA: Diagnosis not present

## 2016-07-17 LAB — POCT RAPID STREP A (OFFICE): RAPID STREP A SCREEN: NEGATIVE

## 2016-07-17 MED ORDER — AMOXICILLIN 875 MG PO TABS: 875.0000 mg | ORAL_TABLET | Freq: Two times a day (BID) | ORAL | 0 refills | Status: DC

## 2016-07-17 NOTE — Progress Notes (Signed)
07/17/2016 3:24 PM   DOB: 05-07-1956 / MRN: [id number]  SUBJECTIVE:  Renee Mann is a 60 y.o. female presenting for a globus sensation that started about 2 weeks ago. She denies pain. She associates a new cough. Denies new medication.  She denies GERD.  She did have a cold about 1 week ago. She has not tried any medication.     She is allergic to codeine; atenolol; and hydrochlorothiazide w-triamterene.   She  has a past medical history of Diabetes mellitus (2008); Gastroesophageal reflux disease; Hives; Hypertension; Osteoarthritis; and Palsy ([employer]).    She  reports that she quit smoking about 17 years ago. She has never used smokeless tobacco. She reports that she does not drink alcohol or use drugs. She  reports that she currently engages in sexual activity. The patient  has a past surgical history that includes Abdominal hysterectomy (1987) and burritis surgery (Left, 1998).  Her family history includes Cancer in her mother; Hypertension in her mother and sister; Stroke in her mother.  Review of Systems  Constitutional: Negative for fever.  Respiratory: Negative for shortness of breath.   Cardiovascular: Negative for chest pain and leg swelling.  Skin: Negative for rash.  Neurological: Negative for dizziness.    The problem list and medications were reviewed and updated by myself where necessary and exist elsewhere in the encounter.   OBJECTIVE:  BP 140/80 (BP Location: Right Arm, Patient Position: Sitting, Cuff Size: Small)   Pulse (!) 101   Temp 98.5 F (36.9 C) (Oral)   Resp 18   Ht 4\' 11"  (1.499 m)   Wt 151 lb (68.5 kg)   SpO2 98%   BMI 30.50 kg/m   Physical Exam  Constitutional: She is oriented to person, place, and time. She appears well-developed and well-nourished. No distress.  HENT:  Mouth/Throat:    Cardiovascular: Normal rate and regular rhythm.   Pulmonary/Chest: Effort normal and breath sounds normal.  Musculoskeletal: Normal range of motion.    Neurological: She is alert and oriented to person, place, and time. She displays normal reflexes. No cranial nerve deficit. She exhibits normal muscle tone. Coordination normal.  Skin: Skin is warm and dry. She is not diaphoretic.  Psychiatric: She has a normal mood and affect.    Results for orders placed or performed in visit on 07/17/16 (from the past 72 hour(s))  POCT rapid strep A     Status: None   Collection Time: 07/17/16  3:19 PM  Result Value Ref Range   Rapid Strep A Screen Negative Negative    No results found.  ASSESSMENT AND PLAN  Renee Mann was seen today for throat issues.  Diagnoses and all orders for this visit:  Tonsillar calculus Comments: Advised salt water gargles.  Amox only if she begins to have pain in the tosil, fever, or difficulty swallowing. Will send to ENT if needed.  Orders: -     amoxicillin (AMOXIL) 875 MG tablet; Take 1 tablet (875 mg total) by mouth 2 (two) times daily. Fill only if you begin to have pain, fever, or difficulty swallowing. -     POCT rapid strep A -     Culture, Group A Strep    The patient is advised to call or return to clinic if she does not see an improvement in symptoms, or to seek the care of the closest emergency department if she worsens with the above plan.   Philis Fendt, MHS, PA-C [facility] 07/17/2016 3:24 PM

## 2016-07-17 NOTE — Patient Instructions (Addendum)
Perform hot salt water gargles three times daily and attempt to do 1 minute of gargling during each gargle.     IF you received an x-ray today, you will receive an invoice from [facility]. Please contact [facility] at [phone] with questions or concerns regarding your invoice.   IF you received labwork today, you will receive an invoice from [employer]. Please contact [facility] at [phone] with questions or concerns regarding your invoice.   Our billing staff will not be able to assist you with questions regarding bills from these companies.  You will be contacted with the lab results as soon as they are available. The fastest way to get your results is to activate your My Chart account. Instructions are located on the last page of this paperwork. If you have not heard from [place] regarding the results in 2 weeks, please contact this office.

## 2016-07-19 LAB — CULTURE, GROUP A STREP: Organism ID, Bacteria: NORMAL

## 2016-07-21 NOTE — Progress Notes (Signed)
Advise she stop abx if taking and her sore throat is likely viral and will need time before resolving.

## 2016-08-04 ENCOUNTER — Ambulatory Visit (INDEPENDENT_AMBULATORY_CARE_PROVIDER_SITE_OTHER): Payer: Commercial Managed Care - HMO | Admitting: Internal Medicine

## 2016-08-04 ENCOUNTER — Encounter: Payer: Self-pay | Admitting: Internal Medicine

## 2016-08-04 DIAGNOSIS — I10 Essential (primary) hypertension: Secondary | ICD-10-CM | POA: Diagnosis not present

## 2016-08-04 DIAGNOSIS — D485 Neoplasm of uncertain behavior of skin: Secondary | ICD-10-CM | POA: Diagnosis not present

## 2016-08-04 DIAGNOSIS — E119 Type 2 diabetes mellitus without complications: Secondary | ICD-10-CM | POA: Diagnosis not present

## 2016-08-04 DIAGNOSIS — L509 Urticaria, unspecified: Secondary | ICD-10-CM | POA: Insufficient documentation

## 2016-08-04 MED ORDER — HYDROXYZINE HCL 25 MG PO TABS: 25.0000 mg | ORAL_TABLET | ORAL | 1 refills | Status: DC | PRN

## 2016-08-04 MED ORDER — LORATADINE 10 MG PO TABS: 10.0000 mg | ORAL_TABLET | Freq: Every day | ORAL | 3 refills | Status: DC

## 2016-08-04 MED ORDER — ONETOUCH DELICA LANCETS FINE MISC: 1.0000 | Freq: Every day | 3 refills | Status: DC | PRN

## 2016-08-04 NOTE — Progress Notes (Signed)
Pre visit review using our clinic review tool, if applicable. No additional management support is needed unless otherwise documented below in the visit note. 

## 2016-08-04 NOTE — Assessment & Plan Note (Signed)
Skin bx in 1 mo

## 2016-08-04 NOTE — Patient Instructions (Signed)
Stop Amlodipine 

## 2016-08-04 NOTE — Progress Notes (Signed)
Subjective:  Patient ID: Renee Mann, female    DOB: Feb 13, 1956  Age: 60 y.o. MRN: [id number]  CC: No chief complaint on file.   HPI Renee Mann presents for itching all over x few months off and on. It started as a vaginal itching. C/o hives 2 weeks ago - whelps lasted x1 day. F/u HTN, mole  Outpatient Medications Prior to Visit  Medication Sig Dispense Refill  . amLODipine (NORVASC) 5 MG tablet Take 1 tablet (5 mg total) by mouth daily. 30 tablet 11  . clotrimazole-betamethasone (LOTRISONE) cream Apply 1 application topically 2 (two) times daily. X 1 wk only 30 g 0  . colchicine (COLCRYS) 0.6 MG tablet Take 1 tablet (0.6 mg total) by mouth daily. 30 tablet 11  . hydrOXYzine (ATARAX/VISTARIL) 25 MG tablet Take 1 tablet (25 mg total) by mouth every 4 (four) hours as needed for itching. 60 tablet 0  . metFORMIN (GLUCOPHAGE) 500 MG tablet Take 1 tablet (500 mg total) by mouth daily with breakfast. Overdue for yearly physical w/labs must see MD for refills 30 tablet 11  . Triamcinolone Acetonide (TRIAMCINOLONE 0.1 % CREAM : EUCERIN) CREA Apply 1 application topically 2 (two) times daily. 1 each 2  . amoxicillin (AMOXIL) 875 MG tablet Take 1 tablet (875 mg total) by mouth 2 (two) times daily. Fill only if you begin to have pain, fever, or difficulty swallowing. (Patient not taking: Reported on 08/04/2016) 20 tablet 0   Facility-Administered Medications Prior to Visit  Medication Dose Route Frequency Provider Last Rate Last Dose  . 0.9 %  sodium chloride infusion  500 mL Intravenous Continuous Manus Gunning, MD        ROS Review of Systems  Constitutional: Negative.  Negative for activity change, appetite change, chills, diaphoresis, fatigue, fever and unexpected weight change.  HENT: Negative for congestion, ear pain, facial swelling, hearing loss, mouth sores, nosebleeds, postnasal drip, rhinorrhea, sinus pressure, sneezing, sore throat, tinnitus and trouble swallowing.     Eyes: Negative for pain, discharge, redness, itching and visual disturbance.  Respiratory: Negative for cough, chest tightness, shortness of breath, wheezing and stridor.   Cardiovascular: Negative for chest pain, palpitations and leg swelling.  Gastrointestinal: Negative for abdominal distention, anal bleeding, blood in stool, constipation, diarrhea, nausea and rectal pain.  Genitourinary: Negative for difficulty urinating, dysuria, flank pain, frequency, genital sores, hematuria, pelvic pain, urgency, vaginal bleeding and vaginal discharge.  Musculoskeletal: Negative for arthralgias, back pain, gait problem, joint swelling, neck pain and neck stiffness.  Skin: Positive for rash.  Neurological: Negative for dizziness, tremors, seizures, syncope, speech difficulty, weakness, numbness and headaches.  Hematological: Negative for adenopathy. Does not bruise/bleed easily.  Psychiatric/Behavioral: Negative for behavioral problems, decreased concentration, dysphoric mood, sleep disturbance and suicidal ideas. The patient is not nervous/anxious.     Objective:  BP 140/70   Pulse 92   Temp 98.2 F (36.8 C) (Oral)   Wt 149 lb (67.6 kg)   SpO2 95%   BMI 30.09 kg/m   BP Readings from Last 3 Encounters:  08/04/16 140/70  07/17/16 140/80  06/29/16 (!) 146/80    Wt Readings from Last 3 Encounters:  08/04/16 149 lb (67.6 kg)  07/17/16 151 lb (68.5 kg)  06/29/16 149 lb (67.6 kg)    Physical Exam  Constitutional: She appears well-developed. No distress.  HENT:  Head: Normocephalic.  Right Ear: External ear normal.  Left Ear: External ear normal.  Nose: Nose normal.  Mouth/Throat: Oropharynx is clear and  moist.  Eyes: Conjunctivae are normal. Pupils are equal, round, and reactive to light. Right eye exhibits no discharge. Left eye exhibits no discharge.  Neck: Normal range of motion. Neck supple. No JVD present. No tracheal deviation present. No thyromegaly present.  Cardiovascular:  Normal rate, regular rhythm and normal heart sounds.   Pulmonary/Chest: No stridor. No respiratory distress. She has no wheezes.  Abdominal: Soft. Bowel sounds are normal. She exhibits no distension and no mass. There is no tenderness. There is no rebound and no guarding.  Musculoskeletal: She exhibits no edema or tenderness.  Lymphadenopathy:    She has no cervical adenopathy.  Neurological: She displays normal reflexes. No cranial nerve deficit. She exhibits normal muscle tone. Coordination normal.  Skin: No rash noted. No erythema.  Psychiatric: She has a normal mood and affect. Her behavior is normal. Judgment and thought content normal.    Lab Results  Component Value Date   WBC 8.3 05/23/2016   HGB 14.1 05/23/2016   HCT 42.8 05/23/2016   PLT 472 (H) 05/23/2016   GLUCOSE 115 (H) 04/17/2016   CHOL 183 04/17/2016   TRIG 84.0 04/17/2016   HDL 40.30 04/17/2016   LDLCALC 126 (H) 04/17/2016   ALT 23 04/17/2016   AST 18 04/17/2016   NA 141 04/17/2016   K 3.9 04/17/2016   CL 103 04/17/2016   CREATININE 0.76 04/17/2016   BUN 8 04/17/2016   CO2 29 04/17/2016   TSH 1.20 04/17/2016   HGBA1C 7.0 (H) 04/17/2016   MICROALBUR <0.7 04/17/2016    No results found.  Assessment & Plan:   There are no diagnoses linked to this encounter. I have discontinued [name]'s amoxicillin. I am also having her maintain her amLODipine, colchicine, metFORMIN, triamcinolone 0.1 % cream : eucerin, clotrimazole-betamethasone, and hydrOXYzine. We will continue to administer sodium chloride.  No orders of the defined types were placed in this encounter.    Follow-up: No Follow-up on file.  Walker Kehr, MD

## 2016-08-04 NOTE — Assessment & Plan Note (Signed)
?  etiology Stop Norvasc Loratidine, Hydroxyzine prn RTC 1 mo

## 2016-08-04 NOTE — Assessment & Plan Note (Signed)
Stop Norvasc x 1 mo due to hives

## 2016-08-04 NOTE — Assessment & Plan Note (Signed)
On Metformin 

## 2016-09-01 ENCOUNTER — Ambulatory Visit (INDEPENDENT_AMBULATORY_CARE_PROVIDER_SITE_OTHER): Payer: Commercial Managed Care - HMO | Admitting: Internal Medicine

## 2016-09-01 ENCOUNTER — Encounter: Payer: Self-pay | Admitting: Internal Medicine

## 2016-09-01 VITALS — BP 150/80 | HR 104 | Wt 153.0 lb

## 2016-09-01 DIAGNOSIS — D489 Neoplasm of uncertain behavior, unspecified: Secondary | ICD-10-CM

## 2016-09-01 DIAGNOSIS — D485 Neoplasm of uncertain behavior of skin: Secondary | ICD-10-CM

## 2016-09-01 DIAGNOSIS — L821 Other seborrheic keratosis: Secondary | ICD-10-CM | POA: Diagnosis not present

## 2016-09-01 NOTE — Progress Notes (Signed)
Pre visit review using our clinic review tool, if applicable. No additional management support is needed unless otherwise documented below in the visit note. 

## 2016-09-01 NOTE — Progress Notes (Signed)
Subjective:  Patient ID: Renee Mann, female    DOB: 05-07-1956  Age: 61 y.o. MRN: [id number]  CC: No chief complaint on file.   HPI Renee Mann presents for skin bx  Outpatient Medications Prior to Visit  Medication Sig Dispense Refill  . amLODipine (NORVASC) 5 MG tablet Take 1 tablet (5 mg total) by mouth daily. 30 tablet 11  . clotrimazole-betamethasone (LOTRISONE) cream Apply 1 application topically 2 (two) times daily. X 1 wk only 30 g 0  . colchicine (COLCRYS) 0.6 MG tablet Take 1 tablet (0.6 mg total) by mouth daily. 30 tablet 11  . hydrOXYzine (ATARAX/VISTARIL) 25 MG tablet Take 1 tablet (25 mg total) by mouth every 4 (four) hours as needed for itching. 60 tablet 1  . loratadine (CLARITIN) 10 MG tablet Take 1 tablet (10 mg total) by mouth daily. 100 tablet 3  . metFORMIN (GLUCOPHAGE) 500 MG tablet Take 1 tablet (500 mg total) by mouth daily with breakfast. Overdue for yearly physical w/labs must see MD for refills 30 tablet 11  . ONETOUCH DELICA LANCETS FINE MISC 1 Device by Does not apply route daily as needed. 100 each 3  . Triamcinolone Acetonide (TRIAMCINOLONE 0.1 % CREAM : EUCERIN) CREA Apply 1 application topically 2 (two) times daily. 1 each 2   Facility-Administered Medications Prior to Visit  Medication Dose Route Frequency Provider Last Rate Last Dose  . 0.9 %  sodium chloride infusion  500 mL Intravenous Continuous Manus Gunning, MD        ROS Review of Systems  Objective:  BP (!) 150/80   Pulse (!) 104   Wt 153 lb (69.4 kg)   SpO2 97%   BMI 30.90 kg/m   BP Readings from Last 3 Encounters:  09/01/16 (!) 150/80  08/04/16 140/70  07/17/16 140/80    Wt Readings from Last 3 Encounters:  09/01/16 153 lb (69.4 kg)  08/04/16 149 lb (67.6 kg)  07/17/16 151 lb (68.5 kg)    Physical Exam    Procedure Note :     Procedure :  Skin biopsy   Indication:  Changing mole (s ),  Suspicious lesion(s)   Risks including unsuccessful procedure ,  bleeding, infection, bruising, scar, a need for another complete procedure and others were explained to the patient in detail as well as the benefits. Informed consent was obtained and signed.   The patient was placed in a decubitus position.  Lesion #1 on  R breast   measuring  6x4   mm   Skin over lesion #1  was prepped with Betadine and alcohol  and anesthetized with 1 cc of 2% lidocaine and epinephrine, using a 25-gauge 1 inch needle.  Shave biopsy with a sterile Dermablade was carried out in the usual fashion. Hyfrecator was used to destroy the rest of the lesion potentially left behind and for hemostasis. Band-Aid was applied with antibiotic ointment.       Lab Results  Component Value Date   WBC 8.3 05/23/2016   HGB 14.1 05/23/2016   HCT 42.8 05/23/2016   PLT 472 (H) 05/23/2016   GLUCOSE 115 (H) 04/17/2016   CHOL 183 04/17/2016   TRIG 84.0 04/17/2016   HDL 40.30 04/17/2016   LDLCALC 126 (H) 04/17/2016   ALT 23 04/17/2016   AST 18 04/17/2016   NA 141 04/17/2016   K 3.9 04/17/2016   CL 103 04/17/2016   CREATININE 0.76 04/17/2016   BUN 8 04/17/2016   CO2 29  04/17/2016   TSH 1.20 04/17/2016   HGBA1C 7.0 (H) 04/17/2016   MICROALBUR <0.7 04/17/2016    No results found.  Assessment & Plan:   There are no diagnoses linked to this encounter. I am having Ms. Upadhyay maintain her amLODipine, colchicine, metFORMIN, triamcinolone 0.1 % cream : eucerin, clotrimazole-betamethasone, hydrOXYzine, loratadine, and ONETOUCH DELICA LANCETS FINE. We will continue to administer sodium chloride.  No orders of the defined types were placed in this encounter.    Follow-up: No Follow-up on file.  Walker Kehr, MD

## 2016-09-01 NOTE — Patient Instructions (Signed)
Postprocedure instructions :    A Band-Aid should be  changed twice daily. You can take a shower tomorrow.  Keep the wounds clean. You can wash them with liquid soap and water. Pat dry with gauze or a Kleenex tissue  Before applying antibiotic ointment and a Band-Aid.   You need to report immediately  if fever, chills or any signs of infection develop.    The biopsy results should be available in 1 -2 weeks. 

## 2016-09-01 NOTE — Assessment & Plan Note (Signed)
See procedure 

## 2016-09-02 ENCOUNTER — Telehealth: Payer: Self-pay | Admitting: Internal Medicine

## 2016-09-02 NOTE — Telephone Encounter (Signed)
Renee Mann,  Please inform patient that her skin bx was benign Thx

## 2016-09-04 NOTE — Telephone Encounter (Signed)
Pt informed

## 2016-09-07 DIAGNOSIS — M25571 Pain in right ankle and joints of right foot: Secondary | ICD-10-CM | POA: Diagnosis not present

## 2016-09-18 DIAGNOSIS — M76821 Posterior tibial tendinitis, right leg: Secondary | ICD-10-CM | POA: Diagnosis not present

## 2016-09-18 DIAGNOSIS — M25571 Pain in right ankle and joints of right foot: Secondary | ICD-10-CM | POA: Diagnosis not present

## 2016-09-29 DIAGNOSIS — M76821 Posterior tibial tendinitis, right leg: Secondary | ICD-10-CM | POA: Diagnosis not present

## 2016-10-06 DIAGNOSIS — M76821 Posterior tibial tendinitis, right leg: Secondary | ICD-10-CM | POA: Diagnosis not present

## 2016-10-13 DIAGNOSIS — M76821 Posterior tibial tendinitis, right leg: Secondary | ICD-10-CM | POA: Diagnosis not present

## 2016-12-05 ENCOUNTER — Encounter (HOSPITAL_COMMUNITY): Payer: Self-pay | Admitting: Family Medicine

## 2016-12-05 ENCOUNTER — Ambulatory Visit (HOSPITAL_COMMUNITY)
Admission: EM | Admit: 2016-12-05 | Discharge: 2016-12-05 | Disposition: A | Discharge status: Home / Self Care | Payer: Commercial Managed Care - HMO | Attending: Family Medicine | Admitting: Family Medicine

## 2016-12-05 DIAGNOSIS — J029 Acute pharyngitis, unspecified: Secondary | ICD-10-CM

## 2016-12-05 DIAGNOSIS — L259 Unspecified contact dermatitis, unspecified cause: Secondary | ICD-10-CM

## 2016-12-05 MED ORDER — CHLORHEXIDINE GLUCONATE 0.12 % MT SOLN: 15.0000 mL | Freq: Two times a day (BID) | OROMUCOSAL | 0 refills | Status: DC

## 2016-12-05 MED ORDER — CLOTRIMAZOLE-BETAMETHASONE 1-0.05 % EX CREA: 1.0000 "application " | TOPICAL_CREAM | Freq: Two times a day (BID) | CUTANEOUS | 0 refills | Status: DC

## 2016-12-05 MED ORDER — PREDNISONE 20 MG PO TABS: ORAL_TABLET | ORAL | 0 refills | Status: DC

## 2016-12-05 NOTE — ED Provider Notes (Signed)
[facility]    CSN: [id number] Arrival date & time: 12/05/16  [place]     History   Chief Complaint Chief Complaint  Patient presents with  . Rash    HPI Renee Mann is a 61 y.o. female.   This is 61 year old woman comes in with an itchy left upper extremity rash that began on her hand. It also involves the cleavage between her breasts.  Patient also has an itchy area on her labia for which she's taken very effective cream in the past.  If there are problems she has is irritation in her throat.      Past Medical History:  Diagnosis Date  . Diabetes mellitus 2008   type II  . Gastroesophageal reflux disease   . Hives   . Hypertension   . Osteoarthritis   . 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 Urticaria 08/04/2016  . Neoplasm of uncertain behavior of skin 04/17/2016  . Gout 03/20/2015  . Well adult exam 03/20/2015  . GERD 07/16/2010  . OSTEOARTHRITIS 03/27/2008  . INSOMNIA, PERSISTENT 07/13/2007  . HYPERTENSION 07/13/2007  . Diabetes mellitus, type II ([place]) 06/20/2007    Past Surgical History:  Procedure Laterality Date  . ABDOMINAL HYSTERECTOMY  1987  . burritis surgery Left 1998   hip    OB History    No data available       Home Medications    Prior to Admission medications   Medication Sig Start Date End Date Taking? Authorizing Provider  metFORMIN (GLUCOPHAGE) 500 MG tablet Take 1 tablet (500 mg total) by mouth daily with breakfast. Overdue for yearly physical w/labs must see MD for refills 04/17/16  Yes Aleksei Plotnikov V, MD  amLODipine (NORVASC) 5 MG tablet Take 1 tablet (5 mg total) by mouth daily. 04/17/16   Aleksei Plotnikov V, MD  chlorhexidine (PERIDEX) 0.12 % solution Use as directed 15 mLs in the mouth or throat 2 (two) times daily. 12/05/16   [name], MD  clotrimazole-betamethasone (LOTRISONE) cream Apply 1 application topically 2 (two) times daily. X 1 wk only  12/05/16   [name], MD  colchicine (COLCRYS) 0.6 MG tablet Take 1 tablet (0.6 mg total) by mouth daily. 04/17/16   Aleksei Plotnikov V, MD  hydrOXYzine (ATARAX/VISTARIL) 25 MG tablet Take 1 tablet (25 mg total) by mouth every 4 (four) hours as needed for itching. 08/04/16   Aleksei Plotnikov V, MD  loratadine (CLARITIN) 10 MG tablet Take 1 tablet (10 mg total) by mouth daily. 08/04/16 08/04/17  Cassandria Anger, MD  ONETOUCH DELICA LANCETS FINE MISC 1 Device by Does not apply route daily as needed. 08/04/16   Aleksei Plotnikov V, MD  predniSONE (DELTASONE) 20 MG tablet Two daily with food 12/05/16   [name], MD  Triamcinolone Acetonide (TRIAMCINOLONE 0.1 % CREAM : EUCERIN) CREA Apply 1 application topically 2 (two) times daily. 05/23/16   Shawnee Knapp, MD    Family History Family History  Problem Relation Age of Onset  . Hypertension Mother   . Cancer Mother   . Stroke Mother   . Hypertension Sister   . Hypertension    . Colon cancer Neg Hx   . Esophageal cancer Neg Hx   . Stomach cancer Neg Hx   . Rectal cancer Neg Hx     Social History Social History  Substance Use Topics  . Smoking status:  Former Smoker    Quit date: 08/24/1998  . Smokeless tobacco: Never Used  . Alcohol use No     Allergies   Codeine; Atenolol; and Hydrochlorothiazide w-triamterene   Review of Systems Review of Systems  Constitutional: Negative.   Skin: Positive for rash.     Physical Exam Triage Vital Signs ED Triage Vitals  Enc Vitals Group     BP      Pulse      Resp      Temp      Temp src      SpO2      Weight      Height      Head Circumference      Peak Flow      Pain Score      Pain Loc      Pain Edu?      Excl. in [place]?    No data found.   Updated Vital Signs BP (!) 188/98 (BP Location: Left Arm) Comment: notified md  Pulse 90   Temp 98.2 F (36.8 C) (Oral)   Resp 16   SpO2 99%    Physical Exam  Constitutional: She is oriented to person, place, and  time. She appears well-developed and well-nourished.  HENT:  Right Ear: External ear normal.  Left Ear: External ear normal.  Mouth/Throat: Oropharynx is clear and moist.  Eyes: Conjunctivae are normal. Pupils are equal, round, and reactive to light.  Neck: Normal range of motion. Neck supple.  Pulmonary/Chest: Effort normal.  Musculoskeletal: Normal range of motion.  Neurological: She is alert and oriented to person, place, and time.  Skin: Skin is warm and dry. There is erythema.  Patient has an excoriated rash on her dorsal left hand and ipsilateral volar forearm. She also has a slightly erythematous raised rash in the cleavage between her breasts.  These areas appear to be a contact dermatitis.  Nursing note and vitals reviewed.    UC Treatments / Results  Labs (all labs ordered are listed, but only abnormal results are displayed) Labs Reviewed - No data to display  EKG  EKG Interpretation None       Radiology No results found.  Procedures Procedures (including critical care time)  Medications Ordered in UC Medications - No data to display   Initial Impression / Assessment and Plan / UC Course  I have reviewed the triage vital signs and the nursing notes.  Pertinent labs & imaging results that were available during my care of the patient were reviewed by me and considered in my medical decision making (see chart for details).     Final Clinical Impressions(s) / UC Diagnoses   Final diagnoses:  Contact dermatitis, unspecified contact dermatitis type, unspecified trigger  Sore throat    New Prescriptions Discharge Medication List as of 12/05/2016  5:31 PM    START taking these medications   Details  chlorhexidine (PERIDEX) 0.12 % solution Use as directed 15 mLs in the mouth or throat 2 (two) times daily., Starting Sat 12/05/2016, Normal    predniSONE (DELTASONE) 20 MG tablet Two daily with food, Normal         [name], MD 12/05/16 [id number]

## 2016-12-05 NOTE — Discharge Instructions (Signed)
Follow-up the her primary care doctor if you are not improving.

## 2016-12-05 NOTE — ED Triage Notes (Signed)
Pt said she has rash on her left arm and hand. Also having itching of her throat and vagina. Since yesterday. No fever.

## 2016-12-15 ENCOUNTER — Ambulatory Visit (INDEPENDENT_AMBULATORY_CARE_PROVIDER_SITE_OTHER): Payer: Commercial Managed Care - HMO | Admitting: Internal Medicine

## 2016-12-15 ENCOUNTER — Other Ambulatory Visit: Payer: Self-pay | Admitting: Internal Medicine

## 2016-12-15 ENCOUNTER — Other Ambulatory Visit: Payer: Commercial Managed Care - HMO

## 2016-12-15 ENCOUNTER — Encounter: Payer: Self-pay | Admitting: Internal Medicine

## 2016-12-15 DIAGNOSIS — I10 Essential (primary) hypertension: Secondary | ICD-10-CM

## 2016-12-15 DIAGNOSIS — R21 Rash and other nonspecific skin eruption: Secondary | ICD-10-CM

## 2016-12-15 DIAGNOSIS — E119 Type 2 diabetes mellitus without complications: Secondary | ICD-10-CM | POA: Diagnosis not present

## 2016-12-15 MED ORDER — METHYLPREDNISOLONE ACETATE 80 MG/ML IJ SUSP
80.0000 mg | Freq: Once | INTRAMUSCULAR | Status: AC
  Administered 2016-12-15: 80 mg via INTRAMUSCULAR

## 2016-12-15 MED ORDER — HYDROXYZINE HCL 25 MG PO TABS: 25.0000 mg | ORAL_TABLET | ORAL | 1 refills | Status: DC | PRN

## 2016-12-15 NOTE — Progress Notes (Signed)
Subjective:  Patient ID: Renee Mann, female    DOB: June 27, 1956  Age: 61 y.o. MRN: [id number]  CC: No chief complaint on file.   HPI Cornelious A Heimsoth presents for rash on arms, back, legs, scalp and throat - worse at night. She stopped Norvasc...  Outpatient Medications Prior to Visit  Medication Sig Dispense Refill  . amLODipine (NORVASC) 5 MG tablet Take 1 tablet (5 mg total) by mouth daily. 30 tablet 11  . chlorhexidine (PERIDEX) 0.12 % solution Use as directed 15 mLs in the mouth or throat 2 (two) times daily. 120 mL 0  . clotrimazole-betamethasone (LOTRISONE) cream Apply 1 application topically 2 (two) times daily. X 1 wk only 30 g 0  . colchicine (COLCRYS) 0.6 MG tablet Take 1 tablet (0.6 mg total) by mouth daily. 30 tablet 11  . hydrOXYzine (ATARAX/VISTARIL) 25 MG tablet Take 1 tablet (25 mg total) by mouth every 4 (four) hours as needed for itching. 60 tablet 1  . loratadine (CLARITIN) 10 MG tablet Take 1 tablet (10 mg total) by mouth daily. 100 tablet 3  . metFORMIN (GLUCOPHAGE) 500 MG tablet Take 1 tablet (500 mg total) by mouth daily with breakfast. Overdue for yearly physical w/labs must see MD for refills 30 tablet 11  . ONETOUCH DELICA LANCETS FINE MISC 1 Device by Does not apply route daily as needed. 100 each 3  . predniSONE (DELTASONE) 20 MG tablet Two daily with food 10 tablet 0  . Triamcinolone Acetonide (TRIAMCINOLONE 0.1 % CREAM : EUCERIN) CREA Apply 1 application topically 2 (two) times daily. 1 each 2   Facility-Administered Medications Prior to Visit  Medication Dose Route Frequency Provider Last Rate Last Dose  . 0.9 %  sodium chloride infusion  500 mL Intravenous Continuous Manus Gunning, MD        ROS Review of Systems  Constitutional: Negative for activity change, appetite change, chills, fatigue and unexpected weight change.  HENT: Negative for congestion, mouth sores and sinus pressure.   Eyes: Negative for visual disturbance.  Respiratory:  Negative for cough and chest tightness.   Gastrointestinal: Negative for abdominal pain and nausea.  Genitourinary: Negative for difficulty urinating, frequency and vaginal pain.  Musculoskeletal: Negative for back pain and gait problem.  Skin: Positive for rash. Negative for pallor.  Neurological: Negative for dizziness, tremors, weakness, numbness and headaches.  Psychiatric/Behavioral: Negative for confusion and sleep disturbance.    Objective:  BP (!) 156/92 (BP Location: Left Arm, Patient Position: Sitting, Cuff Size: Normal)   Pulse 99   Temp 98.6 F (37 C) (Oral)   Ht 4\' 11"  (1.499 m)   Wt 150 lb (68 kg)   SpO2 99%   BMI 30.30 kg/m   BP Readings from Last 3 Encounters:  12/15/16 (!) 156/92  12/05/16 (!) 188/98  09/01/16 (!) 150/80    Wt Readings from Last 3 Encounters:  12/15/16 150 lb (68 kg)  09/01/16 153 lb (69.4 kg)  08/04/16 149 lb (67.6 kg)    Physical Exam  Constitutional: She appears well-developed. No distress.  HENT:  Head: Normocephalic.  Right Ear: External ear normal.  Left Ear: External ear normal.  Nose: Nose normal.  Mouth/Throat: Oropharynx is clear and moist.  Eyes: Conjunctivae are normal. Pupils are equal, round, and reactive to light. Right eye exhibits no discharge. Left eye exhibits no discharge.  Neck: Normal range of motion. Neck supple. No JVD present. No tracheal deviation present. No thyromegaly present.  Cardiovascular: Normal rate, regular  rhythm and normal heart sounds.   Pulmonary/Chest: No stridor. No respiratory distress. She has no wheezes.  Abdominal: Soft. Bowel sounds are normal. She exhibits no distension and no mass. There is no tenderness. There is no rebound and no guarding.  Musculoskeletal: She exhibits no edema or tenderness.  Lymphadenopathy:    She has no cervical adenopathy.  Neurological: She displays normal reflexes. No cranial nerve deficit. She exhibits normal muscle tone. Coordination normal.  Skin: Rash  noted. No erythema.  Psychiatric: She has a normal mood and affect. Her behavior is normal. Judgment and thought content normal.  multiple papules - arms, chest, back, hands, excoriations  Lab Results  Component Value Date   WBC 8.3 05/23/2016   HGB 14.1 05/23/2016   HCT 42.8 05/23/2016   PLT 472 (H) 05/23/2016   GLUCOSE 115 (H) 04/17/2016   CHOL 183 04/17/2016   TRIG 84.0 04/17/2016   HDL 40.30 04/17/2016   LDLCALC 126 (H) 04/17/2016   ALT 23 04/17/2016   AST 18 04/17/2016   NA 141 04/17/2016   K 3.9 04/17/2016   CL 103 04/17/2016   CREATININE 0.76 04/17/2016   BUN 8 04/17/2016   CO2 29 04/17/2016   TSH 1.20 04/17/2016   HGBA1C 7.0 (H) 04/17/2016   MICROALBUR <0.7 04/17/2016    No results found.  Assessment & Plan:   There are no diagnoses linked to this encounter. I am having Ms. Schlup maintain her amLODipine, colchicine, metFORMIN, triamcinolone 0.1 % cream : eucerin, hydrOXYzine, loratadine, ONETOUCH DELICA LANCETS FINE, clotrimazole-betamethasone, predniSONE, and chlorhexidine. We will continue to administer sodium chloride.  No orders of the defined types were placed in this encounter.    Follow-up: No Follow-up on file.  Walker Kehr, MD

## 2016-12-15 NOTE — Assessment & Plan Note (Signed)
On Metformin 

## 2016-12-15 NOTE — Assessment & Plan Note (Addendum)
Depo-Medrol IM Diffuse rash - ?drug allergy: Norvasc vs other Claritin qd Hydroxyzine prn Allergy tests Allergy referral

## 2016-12-15 NOTE — Assessment & Plan Note (Signed)
Norvasc - d/c'd 4/18 due to rash

## 2016-12-15 NOTE — Progress Notes (Signed)
Pre visit review using our clinic review tool, if applicable. No additional management support is needed unless otherwise documented below in the visit note. 

## 2016-12-16 LAB — RESPIRATORY ALLERGY PROFILE REGION II ~~LOC~~
ALLERGEN, CEDAR TREE, T6: 0.64 kU/L — AB
ALLERGEN, COMM SILVER BIRCH, T3: 0.14 kU/L — AB
ALLERGEN, COTTONWOOD, T14: 0.16 kU/L — AB
Allergen, A. alternata, m6: <0.1 kU/L
Allergen, C. Herbarum, M2: <0.1 kU/L
Allergen, D pternoyssinus,d7: 1.95 kU/L — ABNORMAL HIGH
Allergen, Mulberry, t76: 0.3 kU/L — ABNORMAL HIGH
Allergen, Oak,t7: 0.52 kU/L — ABNORMAL HIGH
BERMUDA GRASS: 0.11 kU/L — AB
BOX ELDER: 0.18 kU/L — AB
COMMON RAGWEED: 0.67 kU/L — AB
Cat Dander: <0.1 kU/L
Cockroach: 0.68 kU/L — ABNORMAL HIGH
D. farinae: 1.47 kU/L — ABNORMAL HIGH
Dog Dander: <0.1 kU/L
Elm IgE: 1.35 kU/L — ABNORMAL HIGH
IGE (IMMUNOGLOBULIN E), SERUM: 2342 kU/L — AB (ref <115)
Johnson Grass: <0.1 kU/L
Pecan/Hickory Tree IgE: 1.22 kU/L — ABNORMAL HIGH
ROUGH PIGWEED IGE: 0.1 kU/L — AB
SHEEP SORREL IGE: 0.14 kU/L — AB
Timothy Grass: 0.12 kU/L — ABNORMAL HIGH

## 2016-12-19 LAB — ALLERGEN FOOD PROFILE SPECIFIC IGE
ALLERGEN CORN, IGE: 0.25 kU/L — AB
ALLERGEN TOMATO, IGE: 0.29 kU/L — AB
Allergen Apple, IgE: 0.23 kU/L — AB
Chicken IgE: <0.1 kU/L
Egg White IgE: 0.15 kU/L — AB
IgE (Immunoglobulin E), Serum: 2100 IU/mL — ABNORMAL HIGH (ref 0–100)
MILK IGE: 0.16 kU/L — AB
SHRIMP IGE: 1.43 kU/L — AB
SOYBEAN IGE: 0.78 kU/L — AB
Tuna: <0.1 kU/L
WHEAT IGE: 0.74 kU/L — AB

## 2017-01-14 ENCOUNTER — Ambulatory Visit: Payer: Commercial Managed Care - HMO | Admitting: Internal Medicine

## 2017-01-22 ENCOUNTER — Ambulatory Visit (INDEPENDENT_AMBULATORY_CARE_PROVIDER_SITE_OTHER): Payer: Commercial Managed Care - HMO | Admitting: Internal Medicine

## 2017-01-22 ENCOUNTER — Encounter: Payer: Self-pay | Admitting: Internal Medicine

## 2017-01-22 DIAGNOSIS — I10 Essential (primary) hypertension: Secondary | ICD-10-CM

## 2017-01-22 DIAGNOSIS — E119 Type 2 diabetes mellitus without complications: Secondary | ICD-10-CM | POA: Diagnosis not present

## 2017-01-22 DIAGNOSIS — L509 Urticaria, unspecified: Secondary | ICD-10-CM

## 2017-01-22 DIAGNOSIS — M7989 Other specified soft tissue disorders: Secondary | ICD-10-CM | POA: Diagnosis not present

## 2017-01-22 MED ORDER — AMLODIPINE BESYLATE 5 MG PO TABS: 5.0000 mg | ORAL_TABLET | Freq: Every day | ORAL | 11 refills | Status: DC

## 2017-01-22 NOTE — Assessment & Plan Note (Addendum)
Off meds  BP Readings from Last 3 Encounters:  01/22/17 (!) 168/82  12/15/16 (!) 156/92  12/05/16 (!) 188/98   Pt wants to re-start Norvasc

## 2017-01-22 NOTE — Assessment & Plan Note (Signed)
Hands w/?angioedema Loratidine Depomedrol IM

## 2017-01-22 NOTE — Assessment & Plan Note (Signed)
Metformin 

## 2017-01-22 NOTE — Assessment & Plan Note (Addendum)
5/18 hand swelling and itching: it was R hand last week - now it is the L dorsal hand. No pain ?etiology  Hands w/?angioedema Loratidine Depomedrol IM - declined Allergy appt 02/02/17

## 2017-01-22 NOTE — Progress Notes (Signed)
Subjective:  Patient ID: Renee Mann, female    DOB: 1955/08/25  Age: 61 y.o. MRN: [id number]  CC: Follow-up   HPI Renee Mann presents for hand swelling and itching: it was R hand last week - now it is the L dorsal hand. No pain F/u HTN, DM  Outpatient Medications Prior to Visit  Medication Sig Dispense Refill  . chlorhexidine (PERIDEX) 0.12 % solution Use as directed 15 mLs in the mouth or throat 2 (two) times daily. 120 mL 0  . clotrimazole-betamethasone (LOTRISONE) cream Apply 1 application topically 2 (two) times daily. X 1 wk only 30 g 0  . colchicine (COLCRYS) 0.6 MG tablet Take 1 tablet (0.6 mg total) by mouth daily. 30 tablet 11  . hydrOXYzine (ATARAX/VISTARIL) 25 MG tablet Take 1-2 tablets (25-50 mg total) by mouth every 4 (four) hours as needed for itching. 60 tablet 1  . loratadine (CLARITIN) 10 MG tablet Take 1 tablet (10 mg total) by mouth daily. 100 tablet 3  . metFORMIN (GLUCOPHAGE) 500 MG tablet Take 1 tablet (500 mg total) by mouth daily with breakfast. Overdue for yearly physical w/labs must see MD for refills 30 tablet 11  . ONETOUCH DELICA LANCETS FINE MISC 1 Device by Does not apply route daily as needed. 100 each 3  . Triamcinolone Acetonide (TRIAMCINOLONE 0.1 % CREAM : EUCERIN) CREA Apply 1 application topically 2 (two) times daily. 1 each 2   Facility-Administered Medications Prior to Visit  Medication Dose Route Frequency Provider Last Rate Last Dose  . 0.9 %  sodium chloride infusion  500 mL Intravenous Continuous Armbruster, Renelda Loma, MD        ROS Review of Systems  Constitutional: Negative for activity change, appetite change, chills, fatigue and unexpected weight change.  HENT: Negative for congestion, mouth sores and sinus pressure.   Eyes: Negative for visual disturbance.  Respiratory: Negative for cough and chest tightness.   Gastrointestinal: Negative for abdominal pain and nausea.  Genitourinary: Negative for difficulty urinating, frequency  and vaginal pain.  Musculoskeletal: Negative for arthralgias, back pain and gait problem.  Skin: Positive for color change and rash. Negative for pallor and wound.  Neurological: Negative for dizziness, tremors, weakness, numbness and headaches.  Psychiatric/Behavioral: Negative for confusion and sleep disturbance.    Objective:  BP (!) 168/82 (BP Location: Left Arm, Patient Position: Sitting, Cuff Size: Normal)   Pulse (!) 107   Temp 98.4 F (36.9 C) (Oral)   Ht 4\' 11"  (1.499 m)   Wt 151 lb 8 oz (68.7 kg)   SpO2 98%   BMI 30.60 kg/m   BP Readings from Last 3 Encounters:  01/22/17 (!) 168/82  12/15/16 (!) 156/92  12/05/16 (!) 188/98    Wt Readings from Last 3 Encounters:  01/22/17 151 lb 8 oz (68.7 kg)  12/15/16 150 lb (68 kg)  09/01/16 153 lb (69.4 kg)    Physical Exam  Constitutional: She appears well-developed. No distress.  HENT:  Head: Normocephalic.  Right Ear: External ear normal.  Left Ear: External ear normal.  Nose: Nose normal.  Mouth/Throat: Oropharynx is clear and moist.  Eyes: Conjunctivae are normal. Pupils are equal, round, and reactive to light. Right eye exhibits no discharge. Left eye exhibits no discharge.  Neck: Normal range of motion. Neck supple. No JVD present. No tracheal deviation present. No thyromegaly present.  Cardiovascular: Normal rate, regular rhythm and normal heart sounds.   Pulmonary/Chest: No stridor. No respiratory distress. She has no wheezes.  Abdominal: Soft. Bowel sounds are normal. She exhibits no distension and no mass. There is no tenderness. There is no rebound and no guarding.  Musculoskeletal: She exhibits edema. She exhibits no tenderness.  Lymphadenopathy:    She has no cervical adenopathy.  Neurological: She displays normal reflexes. No cranial nerve deficit. She exhibits normal muscle tone. Coordination normal.  Skin: No rash noted. There is erythema.  Psychiatric: She has a normal mood and affect. Her behavior is  normal. Judgment and thought content normal.  L dorsal hand w/slight erythema and swelling; NT  Lab Results  Component Value Date   WBC 8.3 05/23/2016   HGB 14.1 05/23/2016   HCT 42.8 05/23/2016   PLT 472 (H) 05/23/2016   GLUCOSE 115 (H) 04/17/2016   CHOL 183 04/17/2016   TRIG 84.0 04/17/2016   HDL 40.30 04/17/2016   LDLCALC 126 (H) 04/17/2016   ALT 23 04/17/2016   AST 18 04/17/2016   NA 141 04/17/2016   K 3.9 04/17/2016   CL 103 04/17/2016   CREATININE 0.76 04/17/2016   BUN 8 04/17/2016   CO2 29 04/17/2016   TSH 1.20 04/17/2016   HGBA1C 7.0 (H) 04/17/2016   MICROALBUR <0.7 04/17/2016    No results found.  Assessment & Plan:   There are no diagnoses linked to this encounter. I am having Ms. Chianese maintain her colchicine, metFORMIN, triamcinolone 0.1 % cream : eucerin, loratadine, ONETOUCH DELICA LANCETS FINE, clotrimazole-betamethasone, chlorhexidine, and hydrOXYzine. We will continue to administer sodium chloride.  No orders of the defined types were placed in this encounter.    Follow-up: No Follow-up on file.  Walker Kehr, MD

## 2017-01-25 ENCOUNTER — Telehealth: Payer: Self-pay | Admitting: Internal Medicine

## 2017-01-25 MED ORDER — ONETOUCH DELICA LANCETS FINE MISC: 3 refills | Status: DC

## 2017-01-25 MED ORDER — GLUCOSE BLOOD VI STRP: ORAL_STRIP | 3 refills | Status: DC

## 2017-01-25 NOTE — Telephone Encounter (Signed)
Pt called stating that she needs a refill on her OneTouch Needles and Strips. She was here to see Dr Alain Marion on Friday.

## 2017-01-25 NOTE — Telephone Encounter (Signed)
RX sent

## 2017-01-26 ENCOUNTER — Other Ambulatory Visit (INDEPENDENT_AMBULATORY_CARE_PROVIDER_SITE_OTHER): Payer: Commercial Managed Care - HMO

## 2017-01-26 DIAGNOSIS — E119 Type 2 diabetes mellitus without complications: Secondary | ICD-10-CM

## 2017-01-26 DIAGNOSIS — I10 Essential (primary) hypertension: Secondary | ICD-10-CM

## 2017-01-26 LAB — BASIC METABOLIC PANEL
BUN: 10 mg/dL (ref 6–23)
CALCIUM: 9.6 mg/dL (ref 8.4–10.5)
CHLORIDE: 102 meq/L (ref 96–112)
CO2: 28 meq/L (ref 19–32)
Creatinine, Ser: 0.69 mg/dL (ref 0.40–1.20)
GFR: 110.71 mL/min (ref >60.00)
GLUCOSE: 104 mg/dL — AB (ref 70–99)
Potassium: 3.8 mEq/L (ref 3.5–5.1)
SODIUM: 140 meq/L (ref 135–145)

## 2017-01-26 LAB — LIPID PANEL
CHOLESTEROL: 183 mg/dL (ref 0–200)
HDL: 42.2 mg/dL (ref >39.00)
LDL CALC: 115 mg/dL — AB (ref 0–99)
NonHDL: 141.13
Total CHOL/HDL Ratio: 4
Triglycerides: 133 mg/dL (ref 0.0–149.0)
VLDL: 26.6 mg/dL (ref 0.0–40.0)

## 2017-01-26 LAB — SEDIMENTATION RATE: Sed Rate: 23 mm/hr (ref 0–30)

## 2017-01-26 LAB — URIC ACID: URIC ACID, SERUM: 4.1 mg/dL (ref 2.4–7.0)

## 2017-01-26 LAB — HEPATIC FUNCTION PANEL
ALK PHOS: 108 U/L (ref 39–117)
ALT: 28 U/L (ref 0–35)
AST: 22 U/L (ref 0–37)
Albumin: 4.3 g/dL (ref 3.5–5.2)
BILIRUBIN TOTAL: 0.3 mg/dL (ref 0.2–1.2)
Bilirubin, Direct: 0.1 mg/dL (ref 0.0–0.3)
Total Protein: 7.8 g/dL (ref 6.0–8.3)

## 2017-01-26 LAB — HEMOGLOBIN A1C: HEMOGLOBIN A1C: 7.7 % — AB (ref 4.6–6.5)

## 2017-01-27 ENCOUNTER — Other Ambulatory Visit: Payer: Self-pay | Admitting: Internal Medicine

## 2017-01-27 MED ORDER — METFORMIN HCL 500 MG PO TABS: 500.0000 mg | ORAL_TABLET | Freq: Two times a day (BID) | ORAL | 11 refills | Status: DC

## 2017-02-02 ENCOUNTER — Ambulatory Visit (INDEPENDENT_AMBULATORY_CARE_PROVIDER_SITE_OTHER): Payer: Commercial Managed Care - HMO | Admitting: Allergy and Immunology

## 2017-02-02 ENCOUNTER — Encounter: Payer: Self-pay | Admitting: Allergy and Immunology

## 2017-02-02 VITALS — BP 138/90 | HR 100 | Resp 16 | Ht 59.0 in | Wt 154.8 lb

## 2017-02-02 DIAGNOSIS — L500 Allergic urticaria: Secondary | ICD-10-CM | POA: Diagnosis not present

## 2017-02-02 DIAGNOSIS — L503 Dermatographic urticaria: Secondary | ICD-10-CM

## 2017-02-02 DIAGNOSIS — D721 Eosinophilia, unspecified: Secondary | ICD-10-CM

## 2017-02-02 LAB — CBC WITH DIFFERENTIAL/PLATELET
BASOS: 0 %
Basophils Absolute: 0 10*3/uL (ref 0.0–0.2)
EOS (ABSOLUTE): 0.1 10*3/uL (ref 0.0–0.4)
EOS: 1 %
HEMOGLOBIN: 13.8 g/dL (ref 11.1–15.9)
Hematocrit: 41 % (ref 34.0–46.6)
Lymphocytes Absolute: 2.7 10*3/uL (ref 0.7–3.1)
Lymphs: 42 %
MCH: 28.4 pg (ref 26.6–33.0)
MCHC: 33.7 g/dL (ref 31.5–35.7)
MCV: 84 fL (ref 79–97)
Monocytes Absolute: 0.5 10*3/uL (ref 0.1–0.9)
Monocytes: 8 %
NEUTROS ABS: 3.2 10*3/uL (ref 1.4–7.0)
Neutrophils: 49 %
Platelets: 341 10*3/uL (ref 150–379)
RBC: 4.86 x10E6/uL (ref 3.77–5.28)
RDW: 14.9 % (ref 12.3–15.4)
WBC: 6.5 10*3/uL (ref 3.4–10.8)

## 2017-02-02 MED ORDER — RANITIDINE HCL 150 MG PO TABS: 150.0000 mg | ORAL_TABLET | Freq: Two times a day (BID) | ORAL | 5 refills | Status: DC

## 2017-02-02 MED ORDER — MONTELUKAST SODIUM 10 MG PO TABS: 10.0000 mg | ORAL_TABLET | Freq: Every day | ORAL | 5 refills | Status: DC

## 2017-02-02 NOTE — Progress Notes (Signed)
Dear Dr. Alain Marion,  Thank you for referring Renee Mann to the [facility] on 02/02/2017.   Below is a summation of this patient's evaluation and recommendations.  Thank you for your referral. I will keep you informed about this patient's response to treatment.   If you have any questions please do not hesitate to contact me.   Sincerely,  [name], MD Allergy / Immunology [name]   ______________________________________________________________________    NEW PATIENT NOTE  Referring Provider: Cassandria Anger, MD Primary Provider: Cassandria Anger, MD Date of office visit: 02/02/2017    Subjective:   Chief Complaint:  Renee Mann (DOB: [birth year]) is a 61 y.o. female who presents to the clinic on 02/02/2017 with a chief complaint of New Patient (Initial Visit) and Pruritis .     HPI: Renee Mann presents to this clinic in evaluation of recurrent problems with hives. Apparently in January 2018 she develop problems with itchiness which quickly progressed to developing red raised itchy lesions across her body that never healed with scar or hyperpigmentation and would last about a day or so without any obvious trigger except for occasionally heat exposure. In addition, she has had vaginal itching and has actually had some vaginal swelling. She has treated herself with loratadine on a consistent basis and has also had hydroxyzine and an anti-itch cream administered which has not really resulted in good control of this issue. She does not really have a tremendous amount of atopic disease. She has had blood tests performed which identified a significant abnormalities revolving around allergic disease.  Past Medical History:  Diagnosis Date  . Diabetes mellitus 2008   type II  . Gastroesophageal reflux disease   . Hives   . Hypertension   . Osteoarthritis   . 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 ABDOMINAL HYSTERECTOMY  1987  . burritis surgery Left 1998   hip    Allergies as of 02/02/2017      Reactions   Codeine Hives   Atenolol    REACTION: WEIRD FEELINGS   Hydrochlorothiazide W-triamterene    REACTION: H/A      Medication List      amLODipine 5 MG tablet Commonly known as:  NORVASC Take 1 tablet (5 mg total) by mouth daily.   chlorhexidine 0.12 % solution Commonly known as:  PERIDEX Use as directed 15 mLs in the mouth or throat 2 (two) times daily.   clotrimazole-betamethasone cream Commonly known as:  LOTRISONE Apply 1 application topically 2 (two) times daily. X 1 wk only   colchicine 0.6 MG tablet Commonly known as:  COLCRYS Take 1 tablet (0.6 mg total) by mouth daily.   glucose blood test strip Use 1 times a day PRN. DX: E11.9   hydrOXYzine 25 MG tablet Commonly known as:  ATARAX/VISTARIL Take 1-2 tablets (25-50 mg total) by mouth every 4 (four) hours as needed for itching.   loratadine 10 MG tablet Commonly known as:  CLARITIN Take 1 tablet (10 mg total) by mouth daily.   metFORMIN 500 MG tablet Commonly known as:  GLUCOPHAGE Take 1 tablet (500 mg total) by mouth 2 (two) times daily with a meal.   ONETOUCH DELICA LANCETS FINE Misc Use 1 times a day PRN. DX: E11.9   triamcinolone 0.1 % cream : eucerin Crea  Apply 1 application topically 2 (two) times daily.       Review of systems negative except as noted in HPI / PMHx or noted below:  Review of Systems  Constitutional: Negative.   HENT: Negative.   Eyes: Negative.   Respiratory: Negative.   Cardiovascular: Negative.   Gastrointestinal: Negative.   Genitourinary: Negative.   Musculoskeletal: Negative.   Skin: Negative.   Neurological: Negative.   Endo/Heme/Allergies: Negative.   Psychiatric/Behavioral: Negative.     Family History  Problem Relation Age of Onset  . Hypertension Mother   . Cancer  Mother   . Stroke Mother   . Hypertension Sister   . Hypertension Unknown   . Colon cancer Neg Hx   . Esophageal cancer Neg Hx   . Stomach cancer Neg Hx   . Rectal cancer Neg Hx     Social History   Social History  . Marital status: Married    Spouse name: N/A  . Number of children: N/A  . Years of education: N/A   Occupational History  . 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 Smoking status: Former Smoker    Quit date: 08/24/1998  . Smokeless tobacco: Never Used  . Alcohol use No  . Drug use: No  . Sexual activity: Yes   Other Topics Concern  . Not on file   Social History Narrative   ** Merged History Encounter **        Environmental and Social history  Lives in a house with a dry environment, a dog located inside the household, no carpeting in the bedroom, no plastic on the bed or pillow, and no smokers located inside the household.  Objective:   Vitals:   02/02/17 0911  BP: 138/90  Pulse: 100  Resp: 16   Height: 4\' 11"  (149.9 cm) Weight: 154 lb 12.8 oz (70.2 kg)  Physical Exam  Constitutional: She is well-developed, well-nourished, and in no distress.  HENT:  Head: Normocephalic. Head is without right periorbital erythema and without left periorbital erythema.  Right Ear: Tympanic membrane, external ear and ear canal normal.  Left Ear: Tympanic membrane, external ear and ear canal normal.  Nose: Nose normal. No mucosal edema or rhinorrhea.  Mouth/Throat: Uvula is midline, oropharynx is clear and moist and mucous membranes are normal. No oropharyngeal exudate.  Eyes: Conjunctivae and lids are normal. Pupils are equal, round, and reactive to light.  Neck: Trachea normal. No tracheal tenderness present. No tracheal deviation present. No thyromegaly present.  Cardiovascular: Normal rate, regular rhythm, S1 normal, S2 normal and normal heart sounds.   No murmur heard. Pulmonary/Chest: Effort normal and breath sounds  normal. No stridor. No tachypnea. No respiratory distress. She has no wheezes. She has no rales. She exhibits no tenderness.  Abdominal: Soft. She exhibits no distension and no mass. There is no hepatosplenomegaly. There is no tenderness. There is no rebound and no guarding.  Musculoskeletal: She exhibits no edema or tenderness.  Lymphadenopathy:       Head (right side): No tonsillar adenopathy present.       Head (left side): No tonsillar adenopathy present.    She has no cervical adenopathy.    She has no axillary adenopathy.  Neurological: She is alert. Gait normal.  Skin: No rash noted. She is not diaphoretic. No erythema. No pallor. Nails show no clubbing.  Psychiatric: Mood and affect normal.    Diagnostics: Allergy skin tests were  performed. She had diffuse dermatographia.  Results of blood tests obtained 01/26/2017 and 02/02/2017 identified normal hepatic and renal function, white blood cell count 6.5 with a normal differential and an absolute eosinophil count of 100, lymphocyte count 2700, hemoglobin 13.8, platelet 341.  Results of blood tests obtained 02/14/2017 identified a IgE level of 2100 international units/mL, IgE antibodies directed against multiple foods at relatively low titer and multiple aeroallergens.  Results of blood tests obtained 05/23/2016 identified a white blood cell count of 8.3 with a absolute eosinophil count of 581.  Assessment and Plan:    1. Allergic urticaria   2. Dermatographia   3. Eosinophilia     1. Allergen avoidance measures?  2. Every day utilize the following medications:   A. cetirizine 10 mg twice a day  B. ranitidine 150 mg twice a day  C. montelukast 10 mg one time per day  3. Can add OTC Benadryl if needed  4. Return to clinic in 4 weeks or earlier if problem  The cause of Terry's immunological hyperreactivity manifested as allergic urticaria is unknown but she certainly does have immunological dysregulation given her very high  IgE level. I am going to have her consistently use an H1 and H2 receptor blocker and a leukotriene modifier in an attempt to modify this immunological hyperreactivity. I suspect that her blood tests identifying specific IgE antibodies is just a reflection of her very high overall IgE level and most of these are probably just false positives. I will see her back in this clinic in 4 weeks or earlier if there is a problem.  [name], MD [facility]

## 2017-02-02 NOTE — Patient Instructions (Addendum)
  1. Allergen avoidance measures  2. Every day utilize the following medications:   A. cetirizine 10 mg twice a day  B. ranitidine 150 mg twice a day  C. montelukast 10 mg one time per day  3. Can add OTC Benadryl if needed  4. Return to clinic in 4 weeks or earlier if problem

## 2017-02-17 ENCOUNTER — Ambulatory Visit (INDEPENDENT_AMBULATORY_CARE_PROVIDER_SITE_OTHER): Payer: 59 | Admitting: Physician Assistant

## 2017-02-17 ENCOUNTER — Ambulatory Visit (INDEPENDENT_AMBULATORY_CARE_PROVIDER_SITE_OTHER): Payer: 59

## 2017-02-17 ENCOUNTER — Encounter: Payer: Self-pay | Admitting: Physician Assistant

## 2017-02-17 VITALS — BP 162/88 | HR 105 | Temp 98.7°F | Resp 16 | Ht 59.0 in | Wt 153.8 lb

## 2017-02-17 DIAGNOSIS — M19039 Primary osteoarthritis, unspecified wrist: Secondary | ICD-10-CM

## 2017-02-17 DIAGNOSIS — M25532 Pain in left wrist: Secondary | ICD-10-CM

## 2017-02-17 DIAGNOSIS — R700 Elevated erythrocyte sedimentation rate: Secondary | ICD-10-CM | POA: Diagnosis not present

## 2017-02-17 DIAGNOSIS — R030 Elevated blood-pressure reading, without diagnosis of hypertension: Secondary | ICD-10-CM | POA: Diagnosis not present

## 2017-02-17 DIAGNOSIS — M19032 Primary osteoarthritis, left wrist: Secondary | ICD-10-CM | POA: Diagnosis not present

## 2017-02-17 LAB — POCT SEDIMENTATION RATE: POCT SED RATE: 28 mm/h — AB (ref 0–22)

## 2017-02-17 NOTE — Patient Instructions (Addendum)
Take tylenol 1000 mg every 8 hours as needed for pain. This will not negatively impact your liver, kidneys or stomach.      IF you received an x-ray today, you will receive an invoice from [facility]. Please contact [facility] at [phone] with questions or concerns regarding your invoice.   IF you received labwork today, you will receive an invoice from [facility]. Please contact [facility] at [phone] with questions or concerns regarding your invoice.   Our billing staff will not be able to assist you with questions regarding bills from these companies.  You will be contacted with the lab results as soon as they are available. The fastest way to get your results is to activate your My Chart account. Instructions are located on the last page of this paperwork. If you have not heard from [place] regarding the results in 2 weeks, please contact this office.

## 2017-02-17 NOTE — Progress Notes (Signed)
02/18/2017 9:15 AM   DOB: [birth year] / MRN: [id number]  SUBJECTIVE:  Renee Mann is a 61 y.o. female presenting for left wrist pain that started last night and is generalized about the left wrist.  Tells me the pain radiates up her arm.  She has not tried any medication yet. Tells me that she can not bend her fingers due to pain.   She is allergic to codeine; atenolol; and hydrochlorothiazide w-triamterene.   She  has a past medical history of Diabetes mellitus (2008); Gastroesophageal reflux disease; Hives; Hypertension; Osteoarthritis; and Palsy ([place]).    She  reports that she quit smoking about 18 years ago. She has never used smokeless tobacco. She reports that she does not drink alcohol or use drugs. She  reports that she currently engages in sexual activity. The patient  has a past surgical history that includes Abdominal hysterectomy (1987) and burritis surgery (Left, 1998).  Her family history includes Cancer in her mother; Hypertension in her mother and sister; Stroke in her mother.  Review of Systems  Constitutional: Negative for fever.  Musculoskeletal: Positive for joint pain. Negative for falls, myalgias and neck pain.  Skin: Negative for rash.  Neurological: Negative for dizziness.    The problem list and medications were reviewed and updated by myself where necessary and exist elsewhere in the encounter.   OBJECTIVE:  BP (!) 162/88 (BP Location: Right Arm, Patient Position: Sitting, Cuff Size: Normal)   Pulse (!) 105   Temp 98.7 F (37.1 C) (Oral)   Resp 16   Ht 4\' 11"  (1.499 m)   Wt 153 lb 12.8 oz (69.8 kg)   SpO2 100%   BMI 31.06 kg/m   Pulse Readings from Last 3 Encounters:  02/17/17 (!) 105  02/02/17 100  01/22/17 (!) 107   BP Readings from Last 3 Encounters:  02/17/17 (!) 162/88  02/02/17 138/90  01/22/17 (!) 168/82      Physical Exam  Constitutional: She is oriented to person, place, and time.  Cardiovascular: Normal rate.     Pulmonary/Chest: Effort normal and breath sounds normal.  Musculoskeletal:       Left wrist: She exhibits decreased range of motion (2/2 to pain) and bony tenderness.  Neurological: She is alert and oriented to person, place, and time.    Results for orders placed or performed in visit on 02/17/17 (from the past 72 hour(s))  POCT SEDIMENTATION RATE     Status: Abnormal   Collection Time: 02/17/17  1:42 PM  Result Value Ref Range   POCT SED RATE 28 (A) 0 - 22 mm/hr    Dg Wrist Complete Left  Result Date: 02/17/2017 CLINICAL DATA:  Left wrist pain. EXAM: LEFT WRIST - COMPLETE 3+ VIEW COMPARISON:  No prior . FINDINGS: No acute bony or joint abnormality identified. No evidence of fracture or dislocation. Calcification noted adjacent to the left first metatarsophalangeal joint, most likely dystrophic and related to degenerative change or prior injury. IMPRESSION: Diffuse degenerative change. No evidence of fracture or dislocation. No acute bony abnormality. Electronically Signed   By: Marcello Moores  Register   On: 02/17/2017 12:10   Lab Results  Component Value Date   CREATININE 0.69 01/26/2017   BUN 10 01/26/2017   NA 140 01/26/2017   K 3.8 01/26/2017   CL 102 01/26/2017   CO2 28 01/26/2017   Lab Results  Component Value Date   WBC 6.5 02/02/2017   HGB 13.8 02/02/2017   HCT 41.0 02/02/2017  MCV 84 02/02/2017   PLT 341 02/02/2017   Lab Results  Component Value Date   HGBA1C 7.7 (H) 01/26/2017   Lab Results  Component Value Date   ALT 28 01/26/2017   AST 22 01/26/2017   ALKPHOS 108 01/26/2017   BILITOT 0.3 01/26/2017      ASSESSMENT AND PLAN:  Renee Mann was seen today for wrist pain.  Diagnoses and all orders for this visit:  Left wrist pain: See problem 2. -     POCT SEDIMENTATION RATE -     DG Wrist Complete Left; Future  Wrist arthritis: She has several health comorbid conditions.  Advised we start with tylenol at 3 grams daily and will try a soft wrist brace for  support.  I did advise that she try not to let pain limit her function as then she could lose function.  PT is not out of the question if she returns with no relief. Sed rate is elevated however not helpful. Will add on a CRP.   Elevated blood pressure reading: Advised she see her PCP for this. She does check this at home and tells me her pressure tends to measure closer to 327 systolic.     The patient is advised to call or return to clinic if she does not see an improvement in symptoms, or to seek the care of the closest emergency department if she worsens with the above plan.   Philis Fendt, MHS, PA-C [facility] 02/18/2017 9:15 AM

## 2017-02-18 DIAGNOSIS — R700 Elevated erythrocyte sedimentation rate: Secondary | ICD-10-CM | POA: Diagnosis not present

## 2017-02-18 NOTE — Addendum Note (Signed)
Addended by: Gari Crown D on: 02/18/2017 10:16 AM   Modules accepted: Orders

## 2017-02-19 LAB — C-REACTIVE PROTEIN: CRP: 4.6 mg/L (ref 0.0–4.9)

## 2017-03-02 DIAGNOSIS — H40033 Anatomical narrow angle, bilateral: Secondary | ICD-10-CM | POA: Diagnosis not present

## 2017-03-02 DIAGNOSIS — E119 Type 2 diabetes mellitus without complications: Secondary | ICD-10-CM | POA: Diagnosis not present

## 2017-03-03 ENCOUNTER — Encounter: Payer: Self-pay | Admitting: Allergy and Immunology

## 2017-03-03 ENCOUNTER — Ambulatory Visit (INDEPENDENT_AMBULATORY_CARE_PROVIDER_SITE_OTHER): Payer: 59 | Admitting: Allergy and Immunology

## 2017-03-03 VITALS — BP 132/82 | HR 99 | Temp 98.0°F | Resp 19

## 2017-03-03 DIAGNOSIS — L503 Dermatographic urticaria: Secondary | ICD-10-CM

## 2017-03-03 DIAGNOSIS — L500 Allergic urticaria: Secondary | ICD-10-CM | POA: Diagnosis not present

## 2017-03-03 NOTE — Patient Instructions (Signed)
  1. Discontinue ranitidine  2. Every day utilize the following medications:   A. cetirizine 10 mg twice a day  B. montelukast 10 mg one time per day  3. Can add OTC Benadryl if needed  4. Return to clinic in October 2018 or earlier if problem

## 2017-03-03 NOTE — Progress Notes (Signed)
Follow-up Note  Referring Provider: Cassandria Anger, MD Primary Provider: Cassandria Anger, MD Date of Office Visit: 03/03/2017  Subjective:   Renee Mann (DOB: Dec 31, 1955) is a 61 y.o. female who returns to the [facility] on 03/03/2017 in re-evaluation of the following:  HPI: [name] returns to this clinic in reevaluation of her urticaria and eosinophilia. Her last visit to this clinic was her initial evaluation a 02/02/2017.  She has not had any problems with her skin while consistently using a combination of cetirizine and ranitidine and montelukast. She has not had any adverse effect from utilizing these medications.  Allergies as of 03/03/2017      Reactions   Codeine Hives   Atenolol    REACTION: WEIRD FEELINGS   Hydrochlorothiazide W-triamterene    REACTION: H/A      Medication List      amLODipine 5 MG tablet Commonly known as:  NORVASC Take 1 tablet (5 mg total) by mouth daily.   clotrimazole-betamethasone cream Commonly known as:  LOTRISONE Apply 1 application topically 2 (two) times daily. X 1 wk only   colchicine 0.6 MG tablet Commonly known as:  COLCRYS Take 1 tablet (0.6 mg total) by mouth daily.   glucose blood test strip Use 1 times a day PRN. DX: E11.9   hydrOXYzine 25 MG tablet Commonly known as:  ATARAX/VISTARIL Take 1-2 tablets (25-50 mg total) by mouth every 4 (four) hours as needed for itching.   loratadine 10 MG tablet Commonly known as:  CLARITIN Take 1 tablet (10 mg total) by mouth daily.   metFORMIN 500 MG tablet Commonly known as:  GLUCOPHAGE Take 1 tablet (500 mg total) by mouth 2 (two) times daily with a meal.   montelukast 10 MG tablet Commonly known as:  SINGULAIR Take 1 tablet (10 mg total) by mouth at bedtime.   ONETOUCH DELICA LANCETS FINE Misc Use 1 times a day PRN. DX: E11.9   ranitidine 150 MG tablet Commonly known as:  ZANTAC Take 1 tablet (150 mg total) by mouth 2 (two) times daily.     triamcinolone 0.1 % cream : eucerin Crea Apply 1 application topically 2 (two) times daily.       Past Medical History:  Diagnosis Date  . Diabetes mellitus 2008   type II  . Gastroesophageal reflux disease   . Hives   . Hypertension   . Osteoarthritis   . 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 ABDOMINAL HYSTERECTOMY  1987  . burritis surgery Left 1998   hip    Review of systems negative except as noted in HPI / PMHx or noted below:  Review of Systems  Constitutional: Negative.   HENT: Negative.   Eyes: Negative.   Respiratory: Negative.   Cardiovascular: Negative.   Gastrointestinal: Negative.   Genitourinary: Negative.   Musculoskeletal:       Transient left hand arthritis requiring acetaminophen for several days two weeks ago-resolved  Skin: Negative.   Neurological: Negative.   Endo/Heme/Allergies: Negative.   Psychiatric/Behavioral: Negative.      Objective:   Vitals:   03/03/17 1058  BP: 132/82  Pulse: 99  Resp: 19  Temp: 98 F (36.7 C)          Physical Exam  Constitutional: She is well-developed, well-nourished, and in no distress.  HENT:  Head: Normocephalic.  Right Ear: Tympanic membrane, external ear  and ear canal normal.  Left Ear: Tympanic membrane, external ear and ear canal normal.  Nose: Nose normal. No mucosal edema or rhinorrhea.  Mouth/Throat: Uvula is midline, oropharynx is clear and moist and mucous membranes are normal. No oropharyngeal exudate.  Eyes: Conjunctivae are normal.  Neck: Trachea normal. No tracheal tenderness present. No tracheal deviation present. No thyromegaly present.  Cardiovascular: Normal rate, regular rhythm, S1 normal, S2 normal and normal heart sounds.   No murmur heard. Pulmonary/Chest: Breath sounds normal. No stridor. No respiratory distress. She has no wheezes. She has no rales.  Musculoskeletal: She exhibits no edema.  Lymphadenopathy:        Head (right side): No tonsillar adenopathy present.       Head (left side): No tonsillar adenopathy present.    She has no cervical adenopathy.  Neurological: She is alert. Gait normal.  Skin: No rash noted. She is not diaphoretic. No erythema. Nails show no clubbing.  Psychiatric: Mood and affect normal.    Diagnostics: Results of blood tests obtained 02/02/2017 identified a white blood cell count of 6.5, absolute eosinophil count 100, hemoglobin 13.8, platelets 341  Results of blood tests obtained 01/26/2017 identified normal hepatic and renal function with a creatinine of 0.69, sedimentation rate 23  Assessment and Plan:   1. Allergic urticaria   2. Dermatographia     1. Discontinue ranitidine  2. Every day utilize the following medications:   A. cetirizine 10 mg twice a day  B. montelukast 10 mg one time per day  3. Can add OTC Benadryl if needed  4. Return to clinic in October 2018 or earlier if problem  [name] is doing quite well and we will now see if we can consolidate some of her medical treatment by discontinuing her ranitidine. One of 3 things is going to occur in the future. Either she is going to burn out this immunological hyperreactivity or the process responsible for this condition will declare itself better or she will remain with her current pattern of urticaria controlled with medications. There is no need for any further evaluation for eosinophilia as this appears to have resolved. I would like to see her back in this clinic in October 2018 to see if there is a opportunity to further consolidate her treatment at that point.  [name], MD Allergy / Immunology [place]

## 2017-05-02 ENCOUNTER — Other Ambulatory Visit: Payer: Self-pay | Admitting: Internal Medicine

## 2017-05-04 ENCOUNTER — Other Ambulatory Visit: Payer: Self-pay | Admitting: Internal Medicine

## 2017-05-05 ENCOUNTER — Other Ambulatory Visit: Payer: Self-pay | Admitting: Internal Medicine

## 2017-05-20 DIAGNOSIS — Z1231 Encounter for screening mammogram for malignant neoplasm of breast: Secondary | ICD-10-CM | POA: Diagnosis not present

## 2017-05-20 DIAGNOSIS — I10 Essential (primary) hypertension: Secondary | ICD-10-CM | POA: Insufficient documentation

## 2017-05-20 DIAGNOSIS — Z01419 Encounter for gynecological examination (general) (routine) without abnormal findings: Secondary | ICD-10-CM | POA: Diagnosis not present

## 2017-06-09 ENCOUNTER — Ambulatory Visit (INDEPENDENT_AMBULATORY_CARE_PROVIDER_SITE_OTHER): Payer: 59 | Admitting: Allergy and Immunology

## 2017-06-09 VITALS — BP 140/80 | HR 102 | Resp 18

## 2017-06-09 DIAGNOSIS — L503 Dermatographic urticaria: Secondary | ICD-10-CM | POA: Diagnosis not present

## 2017-06-09 DIAGNOSIS — L500 Allergic urticaria: Secondary | ICD-10-CM | POA: Diagnosis not present

## 2017-06-09 NOTE — Patient Instructions (Addendum)
  1. Every day utilize the following medications:   A. cetirizine 10 mg twice a day  B. montelukast 10 mg one time per day  2. Can add OTC Benadryl if needed  3. Return to clinic in February 2019 or earlier if problem

## 2017-06-09 NOTE — Progress Notes (Signed)
Follow-up Note  Referring Provider: Cassandria Anger, MD Primary Provider: Cassandria Anger, MD Date of Office Visit: 06/09/2017  Subjective:   Renee Mann (DOB: 07-09-1956) is a 61 y.o. female who returns to the [facility] on 06/09/2017 in re-evaluation of the following:  HPI: Renee Mann returns to this clinic in reevaluation of her urticaria and dermatographia. I had last seen her in his clinic in June 2018. At that point in time we attempted to consolidate her medical therapy by eliminating her ranitidine.  She did have an episode of hand dermatitis that lasted several days about a month or so ago that responded to her topical treatment and was not associated with any systemic or constitutional symptoms or nausea is provoking factor. Otherwise, she has really done well and has not had recurrent episodes of urticaria or dermatographia as long she continues on her medications on a consistent basis.  Allergies as of 06/09/2017      Reactions   Codeine Hives   Atenolol    REACTION: WEIRD FEELINGS   Hydrochlorothiazide W-triamterene    REACTION: H/A      Medication List      amLODipine 5 MG tablet Commonly known as:  NORVASC Take 1 tablet (5 mg total) by mouth daily.   clotrimazole-betamethasone cream Commonly known as:  LOTRISONE Apply 1 application topically 2 (two) times daily. X 1 wk only   colchicine 0.6 MG tablet Commonly known as:  COLCRYS Take 1 tablet (0.6 mg total) by mouth daily.   glucose blood test strip Use 1 times a day PRN. DX: E11.9   hydrOXYzine 25 MG tablet Commonly known as:  ATARAX/VISTARIL Take 1-2 tablets (25-50 mg total) by mouth every 4 (four) hours as needed for itching.   loratadine 10 MG tablet Commonly known as:  CLARITIN Take 1 tablet (10 mg total) by mouth daily.   metFORMIN 500 MG tablet Commonly known as:  GLUCOPHAGE Take 1 tablet (500 mg total) by mouth 2 (two) times daily with a meal.   montelukast 10 MG  tablet Commonly known as:  SINGULAIR Take 1 tablet (10 mg total) by mouth at bedtime.   ONETOUCH DELICA LANCETS FINE Misc Use 1 times a day PRN. DX: E11.9   triamcinolone 0.1 % cream : eucerin Crea Apply 1 application topically 2 (two) times daily.       Past Medical History:  Diagnosis Date  . Diabetes mellitus 2008   type II  . Gastroesophageal reflux disease   . Hives   . Hypertension   . Osteoarthritis   . 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 ABDOMINAL HYSTERECTOMY  1987  . burritis surgery Left 1998   hip    Review of systems negative except as noted in HPI / PMHx or noted below:  Review of Systems  Constitutional: Negative.   HENT: Negative.   Eyes: Negative.   Respiratory: Negative.   Cardiovascular: Negative.   Gastrointestinal: Negative.   Genitourinary: Negative.   Musculoskeletal: Negative.   Skin: Negative.   Neurological: Negative.   Endo/Heme/Allergies: Negative.   Psychiatric/Behavioral: Negative.      Objective:   Vitals:   06/09/17 1035  BP: 140/80  Pulse: (!) 102  Resp: 18  SpO2: 98%          Physical Exam  Constitutional: She is well-developed, well-nourished, and in no distress.  HENT:  Head: Normocephalic.  Right Ear: Tympanic membrane, external ear and ear canal normal.  Left Ear: Tympanic membrane, external ear and ear canal normal.  Nose: Nose normal. No mucosal edema or rhinorrhea.  Mouth/Throat: Uvula is midline, oropharynx is clear and moist and mucous membranes are normal. No oropharyngeal exudate.  Eyes: Conjunctivae are normal.  Neck: Trachea normal. No tracheal tenderness present. No tracheal deviation present. No thyromegaly present.  Cardiovascular: Normal rate, regular rhythm, S1 normal, S2 normal and normal heart sounds.   No murmur heard. Pulmonary/Chest: Breath sounds normal. No stridor. No respiratory distress. She has no wheezes. She has no rales.    Musculoskeletal: She exhibits no edema.  Lymphadenopathy:       Head (right side): No tonsillar adenopathy present.       Head (left side): No tonsillar adenopathy present.    She has no cervical adenopathy.  Neurological: She is alert. Gait normal.  Skin: No rash noted. She is not diaphoretic. No erythema. Nails show no clubbing.  Psychiatric: Mood and affect normal.    Diagnostics: none   Assessment and Plan:   1. Allergic urticaria   2. Dermatographia     1. Every day utilize the following medications:   A. cetirizine 10 mg twice a day  B. montelukast 10 mg one time per day  2. Can add OTC Benadryl if needed  3. Return to clinic in February 2019 or earlier if problem  [name] appears to be doing relatively well on her current plan. If she only has one slight flareup of her skin condition every few months I think were doing relatively well. She will continue to utilize the therapy mentioned above and I will see her back in this clinic in February 2019 or earlier if there is a problem.  [name], MD Allergy / Immunology [place]

## 2017-06-10 ENCOUNTER — Encounter: Payer: Self-pay | Admitting: Allergy and Immunology

## 2017-09-28 ENCOUNTER — Ambulatory Visit: Payer: 59 | Admitting: Allergy and Immunology

## 2017-09-28 ENCOUNTER — Encounter: Payer: Self-pay | Admitting: Allergy and Immunology

## 2017-09-28 VITALS — BP 148/90 | HR 92 | Resp 20

## 2017-09-28 DIAGNOSIS — L500 Allergic urticaria: Secondary | ICD-10-CM | POA: Diagnosis not present

## 2017-09-28 DIAGNOSIS — L503 Dermatographic urticaria: Secondary | ICD-10-CM | POA: Diagnosis not present

## 2017-09-28 NOTE — Progress Notes (Signed)
Follow-up Note  Referring Provider: Cassandria Anger, MD Primary Provider: Cassandria Anger, MD Date of Office Visit: 09/28/2017  Subjective:   Renee Mann (DOB: 1956/03/03) is a 62 y.o. female who returns to the [facility] on 09/28/2017 in re-evaluation of the following:  HPI: [name] returns to this clinic in reevaluation of her urticaria and dermatographia.  I have not seen her in his clinic since 09 June 2017.  She still continues to be very itchy and still developed red raised itchy spots across her body and still has dermatographia even in the face of utilizing cetirizine twice a day and montelukast.  She discontinued the montelukast because she felt that this was not really helping her.  She has not developed any associated systemic or constitutional symptoms with her condition since last being seen in this clinic.  She refuses to receive the flu vaccine.  Allergies as of 09/28/2017      Reactions   Codeine Hives   Atenolol    REACTION: WEIRD FEELINGS   Hydrochlorothiazide W-triamterene    REACTION: H/A      Medication List      amLODipine 5 MG tablet Commonly known as:  NORVASC Take 1 tablet (5 mg total) by mouth daily.   clotrimazole-betamethasone cream Commonly known as:  LOTRISONE Apply 1 application topically 2 (two) times daily. X 1 wk only   colchicine 0.6 MG tablet Commonly known as:  COLCRYS Take 1 tablet (0.6 mg total) by mouth daily.   glucose blood test strip Use 1 times a day PRN. DX: E11.9   hydrOXYzine 25 MG tablet Commonly known as:  ATARAX/VISTARIL Take 1-2 tablets (25-50 mg total) by mouth every 4 (four) hours as needed for itching.   loratadine 10 MG tablet Commonly known as:  CLARITIN Take 1 tablet (10 mg total) by mouth daily.   metFORMIN 500 MG tablet Commonly known as:  GLUCOPHAGE Take 1 tablet (500 mg total) by mouth 2 (two) times daily with a meal.   ONETOUCH DELICA LANCETS FINE Misc Use 1 times a  day PRN. DX: E11.9   triamcinolone 0.1 % cream : eucerin Crea Apply 1 application topically 2 (two) times daily.       Past Medical History:  Diagnosis Date  . Diabetes mellitus 2008   type II  . Gastroesophageal reflux disease   . Hives   . Hypertension   . Osteoarthritis   . 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 ABDOMINAL HYSTERECTOMY  1987  . burritis surgery Left 1998   hip    Review of systems negative except as noted in HPI / PMHx or noted below:  Review of Systems  Constitutional: Negative.   HENT: Negative.   Eyes: Negative.   Respiratory: Negative.   Cardiovascular: Negative.   Gastrointestinal: Negative.   Genitourinary: Negative.   Musculoskeletal: Negative.   Skin: Negative.   Neurological: Negative.   Endo/Heme/Allergies: Negative.   Psychiatric/Behavioral: Negative.      Objective:   Vitals:   09/28/17 0946  BP: (!) 148/90  Pulse: 92  Resp: 20          Physical Exam  Constitutional: She is well-developed, well-nourished, and in no distress.  HENT:  Head: Normocephalic.  Right Ear: Tympanic membrane, external ear and ear canal normal.  Left Ear: Tympanic membrane, external ear and ear canal normal.  Nose: Nose normal.  No mucosal edema or rhinorrhea.  Mouth/Throat: Uvula is midline, oropharynx is clear and moist and mucous membranes are normal. No oropharyngeal exudate.  Eyes: Conjunctivae are normal.  Neck: Trachea normal. No tracheal tenderness present. No tracheal deviation present. No thyromegaly present.  Cardiovascular: Normal rate, regular rhythm, S1 normal, S2 normal and normal heart sounds.  No murmur heard. Pulmonary/Chest: Breath sounds normal. No stridor. No respiratory distress. She has no wheezes. She has no rales.  Musculoskeletal: She exhibits no edema.  Lymphadenopathy:       Head (right side): No tonsillar adenopathy present.       Head (left side): No tonsillar  adenopathy present.    She has no cervical adenopathy.  Neurological: She is alert. Gait normal.  Skin: Rash (Hyperpigmented macules center lower back with evidence of excoriation) noted. She is not diaphoretic. No erythema. Nails show no clubbing.  Psychiatric: Mood and affect normal.    Diagnostics: none  Assessment and Plan:   1. Allergic urticaria   2. Dermatographia     1. Every day utilize the following medication:   A. cetirizine 10 mg twice a day  2. Can add OTC Benadryl if needed  3. Start Xolair injections 300 mg every month  4. Return to clinic in 3 months or earlier if problem  Terrie has failed medical therapy regarding her urticaria and dermatographia and pruritus and we will now start her on omalizumab injections and see what happens over the course of the next 3 months.  She will contact me during the interval should there be a significant problem.  [name], MD Allergy / Immunology [place]

## 2017-09-28 NOTE — Patient Instructions (Signed)
  1. Every day utilize the following medication:   A. cetirizine 10 mg twice a day  2. Can add OTC Benadryl if needed  3. Start Xolair injections 300 mg every month  4. Return to clinic in 3 months or earlier if problem

## 2017-09-29 ENCOUNTER — Encounter: Payer: Self-pay | Admitting: Allergy and Immunology

## 2017-10-11 ENCOUNTER — Ambulatory Visit (INDEPENDENT_AMBULATORY_CARE_PROVIDER_SITE_OTHER): Payer: 59 | Admitting: Internal Medicine

## 2017-10-11 ENCOUNTER — Encounter: Payer: Self-pay | Admitting: Internal Medicine

## 2017-10-11 ENCOUNTER — Other Ambulatory Visit (INDEPENDENT_AMBULATORY_CARE_PROVIDER_SITE_OTHER): Payer: 59

## 2017-10-11 DIAGNOSIS — E119 Type 2 diabetes mellitus without complications: Secondary | ICD-10-CM | POA: Diagnosis not present

## 2017-10-11 DIAGNOSIS — L509 Urticaria, unspecified: Secondary | ICD-10-CM

## 2017-10-11 DIAGNOSIS — Z0000 Encounter for general adult medical examination without abnormal findings: Secondary | ICD-10-CM

## 2017-10-11 DIAGNOSIS — I10 Essential (primary) hypertension: Secondary | ICD-10-CM | POA: Diagnosis not present

## 2017-10-11 LAB — CBC WITH DIFFERENTIAL/PLATELET
BASOS ABS: 0.1 10*3/uL (ref 0.0–0.1)
Basophils Relative: 1 % (ref 0.0–3.0)
EOS PCT: 2.6 % (ref 0.0–5.0)
Eosinophils Absolute: 0.2 10*3/uL (ref 0.0–0.7)
HEMATOCRIT: 40.2 % (ref 36.0–46.0)
HEMOGLOBIN: 13 g/dL (ref 12.0–15.0)
Lymphocytes Relative: 44.8 % (ref 12.0–46.0)
Lymphs Abs: 3.1 10*3/uL (ref 0.7–4.0)
MCHC: 32.3 g/dL (ref 30.0–36.0)
MCV: 88.1 fl (ref 78.0–100.0)
MONOS PCT: 7.5 % (ref 3.0–12.0)
Monocytes Absolute: 0.5 10*3/uL (ref 0.1–1.0)
NEUTROS PCT: 44.1 % (ref 43.0–77.0)
Neutro Abs: 3.1 10*3/uL (ref 1.4–7.7)
Platelets: 423 10*3/uL — ABNORMAL HIGH (ref 150.0–400.0)
RBC: 4.57 Mil/uL (ref 3.87–5.11)
RDW: 14.2 % (ref 11.5–15.5)
WBC: 7 10*3/uL (ref 4.0–10.5)

## 2017-10-11 LAB — HEPATIC FUNCTION PANEL
ALT: 13 U/L (ref 0–35)
AST: 12 U/L (ref 0–37)
Albumin: 4.3 g/dL (ref 3.5–5.2)
Alkaline Phosphatase: 97 U/L (ref 39–117)
BILIRUBIN DIRECT: 0.1 mg/dL (ref 0.0–0.3)
BILIRUBIN TOTAL: 0.3 mg/dL (ref 0.2–1.2)
Total Protein: 7.8 g/dL (ref 6.0–8.3)

## 2017-10-11 LAB — BASIC METABOLIC PANEL
BUN: 10 mg/dL (ref 6–23)
CO2: 30 meq/L (ref 19–32)
Calcium: 9.6 mg/dL (ref 8.4–10.5)
Chloride: 102 mEq/L (ref 96–112)
Creatinine, Ser: 0.74 mg/dL (ref 0.40–1.20)
GFR: 101.89 mL/min (ref >60.00)
GLUCOSE: 116 mg/dL — AB (ref 70–99)
POTASSIUM: 4 meq/L (ref 3.5–5.1)
Sodium: 141 mEq/L (ref 135–145)

## 2017-10-11 LAB — HEMOGLOBIN A1C: HEMOGLOBIN A1C: 7.4 % — AB (ref 4.6–6.5)

## 2017-10-11 LAB — URINALYSIS
Bilirubin Urine: NEGATIVE
Hgb urine dipstick: NEGATIVE
KETONES UR: NEGATIVE
LEUKOCYTES UA: NEGATIVE
Nitrite: NEGATIVE
PH: 6.5 (ref 5.0–8.0)
SPECIFIC GRAVITY, URINE: 1.01 (ref 1.000–1.030)
Total Protein, Urine: NEGATIVE
UROBILINOGEN UA: 0.2 (ref 0.0–1.0)
Urine Glucose: NEGATIVE

## 2017-10-11 LAB — LIPID PANEL
Cholesterol: 153 mg/dL (ref 0–200)
HDL: 33.2 mg/dL — ABNORMAL LOW (ref >39.00)
LDL Cholesterol: 104 mg/dL — ABNORMAL HIGH (ref 0–99)
NonHDL: 120.2
Total CHOL/HDL Ratio: 5
Triglycerides: 83 mg/dL (ref 0.0–149.0)
VLDL: 16.6 mg/dL (ref 0.0–40.0)

## 2017-10-11 LAB — TSH: TSH: 3.02 u[IU]/mL (ref 0.35–4.50)

## 2017-10-11 MED ORDER — CLOTRIMAZOLE-BETAMETHASONE 1-0.05 % EX CREA: 1.0000 "application " | TOPICAL_CREAM | Freq: Two times a day (BID) | CUTANEOUS | 0 refills | Status: DC

## 2017-10-11 MED ORDER — TRIAMCINOLONE 0.1 % CREAM:EUCERIN CREAM 1:1: 1.0000 "application " | TOPICAL_CREAM | Freq: Two times a day (BID) | CUTANEOUS | 2 refills | Status: DC

## 2017-10-11 MED ORDER — LORATADINE 10 MG PO TABS: 10.0000 mg | ORAL_TABLET | Freq: Every day | ORAL | 3 refills | Status: DC

## 2017-10-11 NOTE — Assessment & Plan Note (Signed)
Metformin Labs 

## 2017-10-11 NOTE — Assessment & Plan Note (Signed)
We discussed age appropriate health related issues, including available/recomended screening tests and vaccinations. We discussed a need for adhering to healthy diet and exercise. Labs/EKG were reviewed/ordered. All questions were answered. Declined shots Mammo q 12 mo Colon last in 2017

## 2017-10-11 NOTE — Patient Instructions (Signed)
  Gluten free trial for 4-6 weeks. OK to use gluten-free bread and gluten-free pasta.    Gluten-Free Diet for Celiac Disease, Adult The gluten-free diet includes all foods that do not contain gluten. Gluten is a protein that is found in wheat, rye, barley, and some other grains. Following the gluten-free diet is the only treatment for people with celiac disease. It helps to prevent damage to the intestines and improves or eliminates the symptoms of celiac disease. Following the gluten-free diet requires some planning. It can be challenging at first, but it gets easier with time and practice. There are more gluten-free options available today than ever before. If you need help finding gluten-free foods or if you have questions, talk with your diet and nutrition specialist (registered dietitian) or your health care provider. What do I need to know about a gluten-free diet?  All fruits, vegetables, and meats are safe to eat and do not contain gluten.  When grocery shopping, start by shopping in the produce, meat, and dairy sections. These sections are more likely to contain gluten-free foods. Then move to the aisles that contain packaged foods if you need to.  Read all food labels. Gluten is often added to foods. Always check the ingredient list and look for warnings, such as "may contain gluten."  Talk with your dietitian or health care provider before taking a gluten-free multivitamin or mineral supplement.  Be aware of gluten-free foods having contact with foods that contain gluten (cross-contamination). This can happen at home and with any processed foods. ? Talk with your health care provider or dietitian about how to reduce the risk of cross-contamination in your home. ? If you have questions about how a food is processed, ask the manufacturer. What key words help to identify gluten? Foods that list any of these key words on the label usually contain gluten:  Wheat, flour, enriched  flour, bromated flour, white flour, durum flour, graham flour, phosphated flour, self-rising flour, semolina, farina, barley (malt), rye, and oats.  Starch, dextrin, modified food starch, or cereal.  Thickening, fillers, or emulsifiers.  Malt flavoring, malt extract, or malt syrup.  Hydrolyzed vegetable protein.  In the [place] packaged foods that are gluten-free are required to be labeled "GF." These foods should be easy to identify and are safe to eat. In the [place] food companies are also required to list common food allergens, including wheat, on their labels. Recommended foods Grains  Amaranth, bean flours, 100% buckwheat flour, corn, millet, nut flours or nut meals, GF oats, quinoa, rice, sorghum, teff, rice wafers, pure cornmeal tortillas, popcorn, and hot cereals made from cornmeal. Hominy, rice, wild rice. Some Asian rice noodles or bean noodles. Arrowroot starch, corn bran, corn flour, corn germ, cornmeal, corn starch, potato flour, potato starch flour, and rice bran. Plain, brown, and sweet rice flours. Rice polish, soy flour, and tapioca starch. Vegetables  All plain fresh, frozen, and canned vegetables. Fruits  All plain fresh, frozen, canned, and dried fruits, and 100% fruit juices. Meats and other protein foods  All fresh beef, pork, poultry, fish, seafood, and eggs. Fish canned in water, oil, brine, or vegetable broth. Plain nuts and seeds, peanut butter. Some lunch meat and some frankfurters. Dried beans, dried peas, and lentils. Dairy  Fresh plain, dry, evaporated, or condensed milk. Cream, butter, sour cream, whipping cream, and most yogurts. Unprocessed cheese, most processed cheeses, some cottage cheese, some cream cheeses. Beverages  Coffee, tea, most herbal teas. Carbonated beverages and some root beers.   Wine, sake, and distilled spirits, such as gin, vodka, and whiskey. Most hard ciders. Fats and oils  Butter, margarine, vegetable oil, hydrogenated butter, olive  oil, shortening, lard, cream, and some mayonnaise. Some commercial salad dressings. Olives. Sweets and desserts  Sugar, honey, some syrups, molasses, jelly, and jam. Plain hard candy, marshmallows, and gumdrops. Pure cocoa powder. Plain chocolate. Custard and some pudding mixes. Gelatin desserts, sorbets, frozen ice pops, and sherbet. Cake, cookies, and other desserts prepared with allowed flours. Some commercial ice creams. Cornstarch, tapioca, and rice puddings. Seasoning and other foods  Some canned or frozen soups. Monosodium glutamate (MSG). Cider, rice, and wine vinegar. Baking soda and baking powder. Cream of tartar. Baking and nutritional yeast. Certain soy sauces made without wheat (ask your dietitian about specific brands that are allowed). Nuts, coconut, and chocolate. Salt, pepper, herbs, spices, flavoring extracts, imitation or artificial flavorings, natural flavorings, and food colorings. Some medicines and supplements. Some lip glosses and other cosmetics. Rice syrups. The items listed may not be a complete list. Talk with your dietitian about what dietary choices are best for you. Foods to avoid Grains  Barley, bran, bulgur, couscous, cracked wheat, Fries, farro, graham, malt, matzo, semolina, wheat germ, and all wheat and rye cereals including spelt and kamut. Cereals containing malt as a flavoring, such as rice cereal. Noodles, spaghetti, macaroni, most packaged rice mixes, and all mixes containing wheat, rye, barley, or triticale. Vegetables  Most creamed vegetables and most vegetables canned in sauces. Some commercially prepared vegetables and salads. Fruits  Thickened or prepared fruits and some pie fillings. Some fruit snacks and fruit roll-ups. Meats and other protein foods  Any meat or meat alternative containing wheat, rye, barley, or gluten stabilizers. These are often marinated or packaged meats and lunch meats. Bread-containing products, such as Swiss steak,  croquettes, meatballs, and meatloaf. Most tuna canned in vegetable broth and turkey with hydrolyzed vegetable protein (HVP) injected as part of the basting. Seitan. Imitation fish. Eggs in sauces made from ingredients to avoid. Dairy  Commercial chocolate milk drinks and malted milk. Some non-dairy creamers. Any cheese product containing ingredients to avoid. Beverages  Certain cereal beverages. Beer, ale, malted milk, and some root beers. Some hard ciders. Some instant flavored coffees. Some herbal teas made with barley or with barley malt added. Fats and oils  Some commercial salad dressings. Sour cream containing modified food starch. Sweets and desserts  Some toffees. Chocolate-coated nuts (may be rolled in wheat flour) and some commercial candies and candy bars. Most cakes, cookies, donuts, pastries, and other baked goods. Some commercial ice cream. Ice cream cones. Commercially prepared mixes for cakes, cookies, and other desserts. Bread pudding and other puddings thickened with flour. Products containing brown rice syrup made with barley malt enzyme. Desserts and sweets made with malt flavoring. Seasoning and other foods  Some curry powders, some dry seasoning mixes, some gravy extracts, some meat sauces, some ketchups, some prepared mustards, and horseradish. Certain soy sauces. Malt vinegar. Bouillon and bouillon cubes that contain HVP. Some chip dips, and some chewing gum. Yeast extract. Brewer's yeast. Caramel color. Some medicines and supplements. Some lip glosses and other cosmetics. The items listed may not be a complete list. Talk with your dietitian about what dietary choices are best for you. Summary  Gluten is a protein that is found in wheat, rye, barley, and some other grains. The gluten-free diet includes all foods that do not contain gluten.  If you need help finding gluten-free foods or if   you have questions, talk with your diet and nutrition specialist (registered  dietitian) or your health care provider.  Read all food labels. Gluten is often added to foods. Always check the ingredient list and look for warnings, such as "may contain gluten." This information is not intended to replace advice given to you by your health care provider. Make sure you discuss any questions you have with your health care provider. Document Released: 08/10/2005 Document Revised: 05/25/2016 Document Reviewed: 05/25/2016 Elsevier Interactive Patient Education  2018 Elsevier Inc.   

## 2017-10-11 NOTE — Progress Notes (Signed)
Subjective:  Patient ID: Renee Mann, female    DOB: Jan 16, 1956  Age: 62 y.o. MRN: [id number]  CC: No chief complaint on file.   HPI Renee Mann presents for a well exam C/o hands cramping at times  Outpatient Medications Prior to Visit  Medication Sig Dispense Refill  . amLODipine (NORVASC) 5 MG tablet Take 1 tablet (5 mg total) by mouth daily. 30 tablet 11  . colchicine (COLCRYS) 0.6 MG tablet Take 1 tablet (0.6 mg total) by mouth daily. 30 tablet 11  . glucose blood test strip Use 1 times a day PRN. DX: E11.9 100 each 3  . hydrOXYzine (ATARAX/VISTARIL) 25 MG tablet Take 1-2 tablets (25-50 mg total) by mouth every 4 (four) hours as needed for itching. 60 tablet 1  . metFORMIN (GLUCOPHAGE) 500 MG tablet Take 1 tablet (500 mg total) by mouth 2 (two) times daily with a meal. 60 tablet 11  . ONETOUCH DELICA LANCETS FINE MISC Use 1 times a day PRN. DX: E11.9 100 each 3  . clotrimazole-betamethasone (LOTRISONE) cream Apply 1 application topically 2 (two) times daily. X 1 wk only 30 g 0  . Triamcinolone Acetonide (TRIAMCINOLONE 0.1 % CREAM : EUCERIN) CREA Apply 1 application topically 2 (two) times daily. 1 each 2  . loratadine (CLARITIN) 10 MG tablet Take 1 tablet (10 mg total) by mouth daily. 100 tablet 3   No facility-administered medications prior to visit.     ROS Review of Systems  Constitutional: Negative for activity change, appetite change, chills, fatigue and unexpected weight change.  HENT: Negative for congestion, mouth sores and sinus pressure.   Eyes: Negative for visual disturbance.  Respiratory: Negative for cough and chest tightness.   Gastrointestinal: Negative for abdominal pain and nausea.  Genitourinary: Negative for difficulty urinating, frequency and vaginal pain.  Musculoskeletal: Positive for arthralgias and myalgias. Negative for back pain and gait problem.  Skin: Positive for rash. Negative for pallor.  Neurological: Negative for dizziness, tremors,  weakness, numbness and headaches.  Psychiatric/Behavioral: Negative for confusion and sleep disturbance.    Objective:  BP (!) 142/86 (BP Location: Left Arm, Patient Position: Sitting, Cuff Size: Normal)   Pulse 91   Temp 98.5 F (36.9 C) (Oral)   Ht 4\' 11"  (1.499 m)   Wt 155 lb (70.3 kg)   SpO2 99%   BMI 31.31 kg/m   BP Readings from Last 3 Encounters:  10/11/17 (!) 142/86  09/28/17 (!) 148/90  06/09/17 140/80    Wt Readings from Last 3 Encounters:  10/11/17 155 lb (70.3 kg)  02/17/17 153 lb 12.8 oz (69.8 kg)  02/02/17 154 lb 12.8 oz (70.2 kg)    Physical Exam  Constitutional: She appears well-developed. No distress.  HENT:  Head: Normocephalic.  Right Ear: External ear normal.  Left Ear: External ear normal.  Nose: Nose normal.  Mouth/Throat: Oropharynx is clear and moist.  Eyes: Conjunctivae are normal. Pupils are equal, round, and reactive to light. Right eye exhibits no discharge. Left eye exhibits no discharge.  Neck: Normal range of motion. Neck supple. No JVD present. No tracheal deviation present. No thyromegaly present.  Cardiovascular: Normal rate, regular rhythm and normal heart sounds.  Pulmonary/Chest: No stridor. No respiratory distress. She has no wheezes.  Abdominal: Soft. Bowel sounds are normal. She exhibits no distension and no mass. There is no tenderness. There is no rebound and no guarding.  Musculoskeletal: She exhibits no edema or tenderness.  Lymphadenopathy:    She has no  cervical adenopathy.  Neurological: She displays normal reflexes. No cranial nerve deficit. She exhibits normal muscle tone. Coordination normal.  Skin: No rash noted. No erythema.  Psychiatric: She has a normal mood and affect. Her behavior is normal. Judgment and thought content normal.    Lab Results  Component Value Date   WBC 6.5 02/02/2017   HGB 13.8 02/02/2017   HCT 41.0 02/02/2017   PLT 341 02/02/2017   GLUCOSE 104 (H) 01/26/2017   CHOL 183 01/26/2017   TRIG  133.0 01/26/2017   HDL 42.20 01/26/2017   LDLCALC 115 (H) 01/26/2017   ALT 28 01/26/2017   AST 22 01/26/2017   NA 140 01/26/2017   K 3.8 01/26/2017   CL 102 01/26/2017   CREATININE 0.69 01/26/2017   BUN 10 01/26/2017   CO2 28 01/26/2017   TSH 1.20 04/17/2016   HGBA1C 7.7 (H) 01/26/2017   MICROALBUR <0.7 04/17/2016    No results found.  Assessment & Plan:   There are no diagnoses linked to this encounter. I am having Renee Mann maintain her colchicine, hydrOXYzine, amLODipine, glucose blood, ONETOUCH DELICA LANCETS FINE, metFORMIN, clotrimazole-betamethasone, loratadine, and triamcinolone 0.1 % cream : eucerin.  Meds ordered this encounter  Medications  . clotrimazole-betamethasone (LOTRISONE) cream    Sig: Apply 1 application topically 2 (two) times daily. X 1 wk only    Dispense:  30 g    Refill:  0  . loratadine (CLARITIN) 10 MG tablet    Sig: Take 1 tablet (10 mg total) by mouth daily.    Dispense:  100 tablet    Refill:  3  . Triamcinolone Acetonide (TRIAMCINOLONE 0.1 % CREAM : EUCERIN) CREA    Sig: Apply 1 application topically 2 (two) times daily.    Dispense:  1 each    Refill:  2    Disp 1 large jar as using on large surface     Follow-up: No Follow-up on file.  Walker Kehr, MD

## 2017-10-11 NOTE — Assessment & Plan Note (Addendum)
Triamc prn Seeing an allergist- Dr Neldon Mc

## 2017-10-11 NOTE — Assessment & Plan Note (Signed)
Norvasc

## 2017-10-26 DIAGNOSIS — L501 Idiopathic urticaria: Secondary | ICD-10-CM | POA: Diagnosis not present

## 2017-11-01 ENCOUNTER — Ambulatory Visit (INDEPENDENT_AMBULATORY_CARE_PROVIDER_SITE_OTHER): Payer: 59 | Admitting: *Deleted

## 2017-11-01 DIAGNOSIS — L500 Allergic urticaria: Secondary | ICD-10-CM

## 2017-11-01 MED ORDER — EPINEPHRINE 0.3 MG/0.3ML IJ SOAJ: INTRAMUSCULAR | 1 refills | Status: DC

## 2017-11-01 MED ORDER — OMALIZUMAB 150 MG ~~LOC~~ SOLR
150.0000 mg | SUBCUTANEOUS | Status: AC
  Administered 2017-11-01 – 2017-12-28 (×3): 150 mg via SUBCUTANEOUS

## 2017-11-01 NOTE — Progress Notes (Signed)
Immunotherapy   Patient Details  Name: Renee Mann MRN: [id number] Date of Birth: [birth year]  11/01/2017  Renee Mann started injections for  Xolair 300 mg  Frequency:Every 4 weeks Epi-Pen:Prescription for Epi-Pen given Consent signed and patient instructions given.   Orlene Erm 11/01/2017, 4:08 PM

## 2017-11-22 DIAGNOSIS — L501 Idiopathic urticaria: Secondary | ICD-10-CM | POA: Diagnosis not present

## 2017-11-30 ENCOUNTER — Ambulatory Visit (INDEPENDENT_AMBULATORY_CARE_PROVIDER_SITE_OTHER): Payer: 59 | Admitting: *Deleted

## 2017-11-30 DIAGNOSIS — L500 Allergic urticaria: Secondary | ICD-10-CM | POA: Diagnosis not present

## 2017-12-14 ENCOUNTER — Ambulatory Visit: Payer: 59

## 2017-12-17 DIAGNOSIS — L501 Idiopathic urticaria: Secondary | ICD-10-CM | POA: Diagnosis not present

## 2017-12-28 ENCOUNTER — Ambulatory Visit (INDEPENDENT_AMBULATORY_CARE_PROVIDER_SITE_OTHER): Payer: 59 | Admitting: *Deleted

## 2017-12-28 DIAGNOSIS — L500 Allergic urticaria: Secondary | ICD-10-CM | POA: Diagnosis not present

## 2018-01-15 DIAGNOSIS — L501 Idiopathic urticaria: Secondary | ICD-10-CM | POA: Diagnosis not present

## 2018-01-24 ENCOUNTER — Other Ambulatory Visit: Payer: Self-pay | Admitting: Internal Medicine

## 2018-01-25 ENCOUNTER — Ambulatory Visit (INDEPENDENT_AMBULATORY_CARE_PROVIDER_SITE_OTHER): Payer: 59 | Admitting: *Deleted

## 2018-01-25 DIAGNOSIS — L500 Allergic urticaria: Secondary | ICD-10-CM

## 2018-01-25 MED ORDER — OMALIZUMAB 150 MG ~~LOC~~ SOLR
300.0000 mg | SUBCUTANEOUS | Status: DC
  Administered 2018-01-25 – 2020-05-10 (×29): 300 mg via SUBCUTANEOUS

## 2018-02-13 DIAGNOSIS — L501 Idiopathic urticaria: Secondary | ICD-10-CM | POA: Diagnosis not present

## 2018-02-22 ENCOUNTER — Ambulatory Visit (INDEPENDENT_AMBULATORY_CARE_PROVIDER_SITE_OTHER): Payer: 59 | Admitting: *Deleted

## 2018-02-22 DIAGNOSIS — L500 Allergic urticaria: Secondary | ICD-10-CM

## 2018-03-12 DIAGNOSIS — L501 Idiopathic urticaria: Secondary | ICD-10-CM | POA: Diagnosis not present

## 2018-03-22 ENCOUNTER — Ambulatory Visit (INDEPENDENT_AMBULATORY_CARE_PROVIDER_SITE_OTHER): Payer: 59 | Admitting: *Deleted

## 2018-03-22 DIAGNOSIS — L500 Allergic urticaria: Secondary | ICD-10-CM

## 2018-03-23 ENCOUNTER — Ambulatory Visit: Payer: 59 | Admitting: Physician Assistant

## 2018-03-23 ENCOUNTER — Ambulatory Visit (INDEPENDENT_AMBULATORY_CARE_PROVIDER_SITE_OTHER): Payer: 59

## 2018-03-23 ENCOUNTER — Other Ambulatory Visit: Payer: Self-pay

## 2018-03-23 ENCOUNTER — Encounter: Payer: Self-pay | Admitting: Physician Assistant

## 2018-03-23 VITALS — BP 161/93 | HR 95 | Temp 98.8°F | Resp 20 | Ht 58.9 in | Wt 154.4 lb

## 2018-03-23 DIAGNOSIS — M79672 Pain in left foot: Secondary | ICD-10-CM

## 2018-03-23 DIAGNOSIS — L03116 Cellulitis of left lower limb: Secondary | ICD-10-CM | POA: Diagnosis not present

## 2018-03-23 DIAGNOSIS — R030 Elevated blood-pressure reading, without diagnosis of hypertension: Secondary | ICD-10-CM | POA: Diagnosis not present

## 2018-03-23 LAB — POCT CBC
Granulocyte percent: 70.1 %G (ref 37–80)
HCT, POC: 40.6 % (ref 37.7–47.9)
HEMOGLOBIN: 12.4 g/dL (ref 12.2–16.2)
Lymph, poc: 2.8 (ref 0.6–3.4)
MCH: 26.4 pg — AB (ref 27–31.2)
MCHC: 30.5 g/dL — AB (ref 31.8–35.4)
MCV: 86.8 fL (ref 80–97)
MID (cbc): 0.3 (ref 0–0.9)
MPV: 7.3 fL (ref 0–99.8)
PLATELET COUNT, POC: 446 10*3/uL — AB (ref 142–424)
POC Granulocyte: 7.2 — AB (ref 2–6.9)
POC LYMPH PERCENT: 26.7 %L (ref 10–50)
POC MID %: 3.2 %M (ref 0–12)
RBC: 4.68 M/uL (ref 4.04–5.48)
RDW, POC: 14.8 %
WBC: 10.3 10*3/uL — AB (ref 4.6–10.2)

## 2018-03-23 MED ORDER — CEPHALEXIN 500 MG PO CAPS: 500.0000 mg | ORAL_CAPSULE | Freq: Four times a day (QID) | ORAL | 0 refills | Status: AC

## 2018-03-23 NOTE — Progress Notes (Signed)
Renee Mann  MRN: [id number] DOB: 16-Mar-1956  Subjective:   Renee Mann is a 62 y.o. female who presents with left foot pain. Onset of the symptoms was 2 days ago. Precipitating event: none known. She did increase her walking a few days prior.  Current symptoms include: ability to bear weight, but with some pain, redness and swelling. Aggravating factors: any weight bearing. Symptoms have progressed to a point and plateaued. Patient has had prior foot problems. Evaluation to date: none. Treatment to date: rest. Has PMH of gout and T2DM, notes this is not consistent with her typical gout flare. Last gout flare: a while ago. Last A1C in 09/2017 was 7.4. No PMH of MRSA. Denies smoking.    Review of Systems  Constitutional: Negative for chills, diaphoresis and fever.  Respiratory: Negative for shortness of breath.   Cardiovascular: Negative for chest pain, palpitations and leg swelling.  Gastrointestinal: Negative for abdominal pain.  Neurological: Negative for dizziness and light-headedness.    Patient Active Problem List   Diagnosis Date Noted  . Hand swelling 01/22/2017  . Rash 12/15/2016  . Urticaria 08/04/2016  . Neoplasm of uncertain behavior of skin 04/17/2016  . Gout 03/20/2015  . Well adult exam 03/20/2015  . GERD 07/16/2010  . OSTEOARTHRITIS 03/27/2008  . INSOMNIA, PERSISTENT 07/13/2007  . HYPERTENSION 07/13/2007  . Diabetes mellitus, type II ([place]) 06/20/2007    Current Outpatient Medications on File Prior to Visit  Medication Sig Dispense Refill  . amLODipine (NORVASC) 5 MG tablet Take 1 tablet (5 mg total) by mouth daily. 30 tablet 11  . clotrimazole-betamethasone (LOTRISONE) cream Apply 1 application topically 2 (two) times daily. X 1 wk only (Patient not taking: Reported on 03/23/2018) 30 g 0  . colchicine (COLCRYS) 0.6 MG tablet Take 1 tablet (0.6 mg total) by mouth daily. (Patient not taking: Reported on 03/23/2018) 30 tablet 11  . EPINEPHrine (EPIPEN 2-PAK) 0.3  mg/0.3 mL IJ SOAJ injection Use as directed for severe allergic reaction (Patient not taking: Reported on 03/23/2018) 2 Device 1  . glucose blood test strip Use 1 times a day PRN. DX: E11.9 (Patient not taking: Reported on 03/23/2018) 100 each 3  . hydrOXYzine (ATARAX/VISTARIL) 25 MG tablet Take 1-2 tablets (25-50 mg total) by mouth every 4 (four) hours as needed for itching. (Patient not taking: Reported on 03/23/2018) 60 tablet 1  . loratadine (CLARITIN) 10 MG tablet Take 1 tablet (10 mg total) by mouth daily. (Patient not taking: Reported on 03/23/2018) 100 tablet 3  . metFORMIN (GLUCOPHAGE) 500 MG tablet TAKE 1 TABLET BY MOUTH TWICE DAILY WITH MEALS (Patient not taking: Reported on 03/23/2018) 180 tablet 3  . ONETOUCH DELICA LANCETS FINE MISC Use 1 times a day PRN. DX: E11.9 (Patient not taking: Reported on 03/23/2018) 100 each 3  . Triamcinolone Acetonide (TRIAMCINOLONE 0.1 % CREAM : EUCERIN) CREA Apply 1 application topically 2 (two) times daily. (Patient not taking: Reported on 03/23/2018) 1 each 2   Current Facility-Administered Medications on File Prior to Visit  Medication Dose Route Frequency Provider Last Rate Last Dose  . omalizumab Arvid Right) injection 300 mg  300 mg Subcutaneous Q28 days [name], MD   300 mg at 03/22/18 1829    Allergies  Allergen Reactions  . Codeine Hives  . Atenolol     REACTION: WEIRD FEELINGS  . Hydrochlorothiazide W-Triamterene     REACTION: H/A   Social History   Socioeconomic History  . Marital status: Married  Spouse name: Not on file  . Number of children: 1  . Years of education: Not on file  . Highest education level: Not on file  Occupational History  . 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 Financial resource strain: Not on file  . Food insecurity:    Worry: Not on file    Inability: Not on file  . Transportation needs:    Medical: Not on file    Non-medical: Not on file  Tobacco Use  .  Smoking status: Former Smoker    Last attempt to quit: 08/24/1998    Years since quitting: 19.6  . Smokeless tobacco: Never Used  Substance and Sexual Activity  . Alcohol use: No    Alcohol/week: 0.0 oz  . Drug use: No  . Sexual activity: Yes  Lifestyle  . Physical activity:    Days per week: Not on file    Minutes per session: Not on file  . Stress: Not on file  Relationships  . Social connections:    Talks on phone: Not on file    Gets together: Not on file    Attends religious service: Not on file    Active member of club or organization: Not on file    Attends meetings of clubs or organizations: Not on file    Relationship status: Not on file  . Intimate partner violence:    Fear of current or ex partner: Not on file    Emotionally abused: Not on file    Physically abused: Not on file    Forced sexual activity: Not on file  Other Topics Concern  . Not on file  Social History Narrative   ** Merged History Encounter **          Objective:  BP (!) 161/93 (BP Location: Left Arm, Patient Position: Sitting, Cuff Size: Normal)   Pulse 95   Temp 98.8 F (37.1 C) (Oral)   Resp 20   Ht 4' 10.9" (1.496 m)   Wt 154 lb 6.4 oz (70 kg)   SpO2 98%   BMI 31.29 kg/m   Physical Exam  Constitutional: She is oriented to person, place, and time. She appears well-developed and well-nourished. No distress.  HENT:  Head: Normocephalic and atraumatic.  Eyes: Conjunctivae are normal.  Neck: Normal range of motion.  Cardiovascular:  Pulses:      Dorsalis pedis pulses are 2+ on the right side, and 2+ on the left side.       Posterior tibial pulses are 2+ on the right side, and 2+ on the left side.  Pulmonary/Chest: Effort normal.  Musculoskeletal:       Right ankle: Normal.       Left ankle: Normal. She exhibits normal range of motion, no swelling, no ecchymosis and normal pulse. No tenderness. Achilles tendon normal.       Right lower leg: She exhibits no tenderness, no bony  tenderness and no swelling.       Left lower leg: Normal. She exhibits no tenderness, no bony tenderness and no swelling.       Right foot: Normal.       Left foot: There is tenderness (when moderate pressure is applied over 2nd and 3rd metatarsal, there is overlying warmth and erythema on dorsum of foot, no abrasion noted.   ) and swelling (mild swelling noted). There is normal range of motion, normal capillary refill and no laceration.  Calf measurements  are equal b/l   Neurological: She is alert and oriented to person, place, and time.  Skin: Skin is warm and dry.  Psychiatric: She has a normal mood and affect.  Vitals reviewed.    Dg Foot Complete Left  Result Date: 03/23/2018 CLINICAL DATA:  62 year old female with 2 days of left foot pain. Redness and warmth dorsally. Painful 2nd metatarsal. No known injury. EXAM: LEFT FOOT - COMPLETE 3+ VIEW COMPARISON:  None. FINDINGS: Bone mineralization is within normal limits. Mild degenerative spurring at the plantar calcaneus. Tarsal bone alignment and joint spaces are within normal limits. Small accessory ossicles adjacent to the cuboid and navicular. The metatarsals appear intact and normally aligned. MTP and IP joint spaces are normal for age. No acute osseous abnormality identified. No discrete soft tissue abnormality identified. IMPRESSION: No acute osseous abnormality identified the left foot. Electronically Signed   By: Genevie Ann M.D.   On: 03/23/2018 12:40   Results for orders placed or performed in visit on 03/23/18 (from the past 24 hour(s))  POCT CBC     Status: Abnormal   Collection Time: 03/23/18 12:33 PM  Result Value Ref Range   WBC 10.3 (A) 4.6 - 10.2 K/uL   Lymph, poc 2.8 0.6 - 3.4   POC LYMPH PERCENT 26.7 10 - 50 %L   MID (cbc) 0.3 0 - 0.9   POC MID % 3.2 0 - 12 %M   POC Granulocyte 7.2 (A) 2 - 6.9   Granulocyte percent 70.1 37 - 80 %G   RBC 4.68 4.04 - 5.48 M/uL   Hemoglobin 12.4 12.2 - 16.2 g/dL   HCT, POC 40.6 37.7 - 47.9  %   MCV 86.8 80 - 97 fL   MCH, POC 26.4 (A) 27 - 31.2 pg   MCHC 30.5 (A) 31.8 - 35.4 g/dL   RDW, POC 14.8 %   Platelet Count, POC 446 (A) 142 - 424 K/uL   MPV 7.3 0 - 99.8 fL    Assessment and Plan :  1. Cellulitis of left lower extremity Hx and PE findings most concerning for cellulitis. Gout is in differential but pt has had multiple gout flares and is persistent that this is gout.  No known injury, but pt did increase her walking a few days prior, could be reactive process. She is afebrile. WBC mildly elevated. No concern for septic joint or osteomyelitis at this time. Plain film of foot negative. Pt has good ROM of foot. Will tx with oral abx. Rec ice, rest, elevation, and NSAIDs as prescribed prn. Given strict return/ED precautions.  - cephALEXin (KEFLEX) 500 MG capsule; Take 1 capsule (500 mg total) by mouth 4 (four) times daily for 7 days.  Dispense: 28 capsule; Refill: 0  2. Left foot pain - POCT CBC - Sedimentation Rate - C-reactive protein - Uric Acid - DG Foot Complete Left; Future  3. Elevated blood pressure reading Likely due to pain. In terms of HTN, she is asymptomatic. Instructed to check bp outside of office over the next couple of weeks. Contact PCP if consistently >140/90. Given strict ED precautions.    Tenna Delaine PA-C  [facility] 03/23/2018 12:45 PM

## 2018-03-23 NOTE — Patient Instructions (Addendum)
We are going to treat for this for overlying skin infection. Start oral antibiotic today. Start ibuprofen every 8 hours. Rest, elevate, and ice your foot. Follow up with your PCP in 2 days to ensure things are healing. If any symptoms worsen or you develop new concerning symptoms, please seek care immediately.    In terms of elevated blood pressure, I would like you to check your blood pressure at least a couple times over the next week outside of the office and document these values. It is best if you check the blood pressure at different times in the day. Your goal is <140/90. If your values are consistently above this goal, please return to office for further evaluation. If you start to have chest pain, blurred vision, shortness of breath, severe headache, lower leg swelling, or nausea/vomiting please seek care immediately here or at the ED.     Cellulitis, Adult Cellulitis is a skin infection. The infected area is usually red and sore. This condition occurs most often in the arms and lower legs. It is very important to get treated for this condition. Follow these instructions at home:  Take over-the-counter and prescription medicines only as told by your doctor.  If you were prescribed an antibiotic medicine, take it as told by your doctor. Do not stop taking the antibiotic even if you start to feel better.  Drink enough fluid to keep your pee (urine) clear or pale yellow.  Do not touch or rub the infected area.  Raise (elevate) the infected area above the level of your heart while you are sitting or lying down.  Place warm or cold wet cloths (warm or cold compresses) on the infected area. Do this as told by your doctor.  Keep all follow-up visits as told by your doctor. This is important. These visits let your doctor make sure your infection is not getting worse. Contact a doctor if:  You have a fever.  Your symptoms do not get better after 1-2 days of treatment.  Your bone or  joint under the infected area starts to hurt after the skin has healed.  Your infection comes back. This can happen in the same area or another area.  You have a swollen bump in the infected area.  You have new symptoms.  You feel ill and also have muscle aches and pains. Get help right away if:  Your symptoms get worse.  You feel very sleepy.  You throw up (vomit) or have watery poop (diarrhea) for a long time.  There are red streaks coming from the infected area.  Your red area gets larger.  Your red area turns darker. This information is not intended to replace advice given to you by your health care provider. Make sure you discuss any questions you have with your health care provider. Document Released: 01/27/2008 Document Revised: 01/16/2016 Document Reviewed: 06/19/2015 Elsevier Interactive Patient Education  2018 [employer].     IF you received an x-ray today, you will receive an invoice from [facility]. Please contact [facility] at [phone] with questions or concerns regarding your invoice.   IF you received labwork today, you will receive an invoice from [facility]. Please contact [facility] at [phone] with questions or concerns regarding your invoice.   Our billing staff will not be able to assist you with questions regarding bills from these companies.  You will be contacted with the lab results as soon as they are available. The fastest way to get your results is  to activate your My Chart account. Instructions are located on the last page of this paperwork. If you have not heard from [place] regarding the results in 2 weeks, please contact this office.

## 2018-03-24 LAB — C-REACTIVE PROTEIN: CRP: 11 mg/L — ABNORMAL HIGH (ref 0–10)

## 2018-03-24 LAB — SEDIMENTATION RATE: SED RATE: 37 mm/h (ref 0–40)

## 2018-03-24 LAB — URIC ACID: Uric Acid: 4.3 mg/dL (ref 2.5–7.1)

## 2018-03-28 ENCOUNTER — Encounter: Payer: Self-pay | Admitting: Physician Assistant

## 2018-03-29 ENCOUNTER — Encounter: Payer: Self-pay | Admitting: Allergy and Immunology

## 2018-03-29 ENCOUNTER — Ambulatory Visit: Payer: 59 | Admitting: Allergy and Immunology

## 2018-03-29 DIAGNOSIS — L509 Urticaria, unspecified: Secondary | ICD-10-CM

## 2018-03-29 NOTE — Assessment & Plan Note (Addendum)
Improved and well controlled.  Continue omalizumab injections 300 mg/month and cetirizine if needed.  Follow-up in 6 months or sooner if needed.

## 2018-03-29 NOTE — Progress Notes (Signed)
    Follow-up Note  RE: Renee Mann MRN: [id number] DOB: [birth year] Date of Office Visit: 03/29/2018  Primary care provider: Cassandria Anger, MD Referring provider: Cassandria Anger, MD  History of present illness: Renee Mann is a 62 y.o. female with urticaria and dermatographia.  She was last seen in this clinic by Dr. Neldon Mc on September 28, 2017.  She reports that since starting omalizumab injections her urticaria has improved significantly.  She rarely requires use of antihistamines.  She has no complaints today and is interested in continuing omalizumab injections for the time being.  Assessment and plan: Urticaria Improved and well controlled.  Continue omalizumab injections 300 mg/month and cetirizine if needed.  Follow-up in 6 months or sooner if needed.   Physical examination: Blood pressure 136/70, pulse 88, resp. rate 16, height 4' 10.5" (1.486 m), weight 157 lb 2.1 oz (71.3 kg), SpO2 97 %.  General: Alert, interactive, in no acute distress. Neck: Supple without lymphadenopathy. Lungs: Clear to auscultation without wheezing, rhonchi or rales. CV: Normal S1, S2 without murmurs. Skin: Warm and dry, without lesions or rashes.  The following portions of the patient's history were reviewed and updated as appropriate: allergies, current medications, past family history, past medical history, past social history, past surgical history and problem list.  Allergies as of 03/29/2018      Reactions   Codeine Hives   Atenolol    REACTION: WEIRD FEELINGS   Hydrochlorothiazide W-triamterene    REACTION: H/A      Medication List        Accurate as of 03/29/18  7:58 PM. Always use your most recent med list.          amLODipine 5 MG tablet Commonly known as:  NORVASC Take 1 tablet (5 mg total) by mouth daily.   cephALEXin 500 MG capsule Commonly known as:  KEFLEX Take 1 capsule (500 mg total) by mouth 4 (four) times daily for 7 days.   colchicine 0.6 MG  tablet Commonly known as:  COLCRYS Take 1 tablet (0.6 mg total) by mouth daily.   EPINEPHrine 0.3 mg/0.3 mL Soaj injection Commonly known as:  EPIPEN 2-PAK Use as directed for severe allergic reaction   glucose blood test strip Use 1 times a day PRN. DX: E11.9   hydrOXYzine 25 MG tablet Commonly known as:  ATARAX/VISTARIL Take 1-2 tablets (25-50 mg total) by mouth every 4 (four) hours as needed for itching.   metFORMIN 500 MG tablet Commonly known as:  GLUCOPHAGE TAKE 1 TABLET BY MOUTH TWICE DAILY WITH MEALS   ONETOUCH DELICA LANCETS FINE Misc Use 1 times a day PRN. DX: E11.9   triamcinolone 0.1 % cream : eucerin Crea Apply 1 application topically 2 (two) times daily.       Allergies  Allergen Reactions  . Codeine Hives  . Atenolol     REACTION: WEIRD FEELINGS  . Hydrochlorothiazide W-Triamterene     REACTION: H/A    I appreciate the opportunity to take part in Danyiel's care. Please do not hesitate to contact me with questions.  Sincerely,   R. Edgar Frisk, MD

## 2018-03-29 NOTE — Patient Instructions (Signed)
Urticaria Improved and well controlled.  Continue omalizumab injections 300 mg/month and cetirizine if needed.  Follow-up in 6 months or sooner if needed.   Return in about 6 months (around 09/29/2018), or if symptoms worsen or fail to improve.

## 2018-04-08 DIAGNOSIS — L501 Idiopathic urticaria: Secondary | ICD-10-CM | POA: Diagnosis not present

## 2018-04-11 ENCOUNTER — Ambulatory Visit: Payer: 59 | Admitting: Internal Medicine

## 2018-04-11 ENCOUNTER — Other Ambulatory Visit (INDEPENDENT_AMBULATORY_CARE_PROVIDER_SITE_OTHER): Payer: 59

## 2018-04-11 ENCOUNTER — Encounter: Payer: Self-pay | Admitting: Internal Medicine

## 2018-04-11 VITALS — BP 136/78 | HR 81 | Temp 98.1°F | Ht 58.5 in | Wt 153.0 lb

## 2018-04-11 DIAGNOSIS — E119 Type 2 diabetes mellitus without complications: Secondary | ICD-10-CM | POA: Diagnosis not present

## 2018-04-11 DIAGNOSIS — R252 Cramp and spasm: Secondary | ICD-10-CM | POA: Diagnosis not present

## 2018-04-11 DIAGNOSIS — I10 Essential (primary) hypertension: Secondary | ICD-10-CM | POA: Diagnosis not present

## 2018-04-11 DIAGNOSIS — Z0000 Encounter for general adult medical examination without abnormal findings: Secondary | ICD-10-CM

## 2018-04-11 LAB — BASIC METABOLIC PANEL
BUN: 10 mg/dL (ref 6–23)
CALCIUM: 10 mg/dL (ref 8.4–10.5)
CO2: 27 mEq/L (ref 19–32)
Chloride: 102 mEq/L (ref 96–112)
Creatinine, Ser: 0.81 mg/dL (ref 0.40–1.20)
GFR: 91.65 mL/min (ref >60.00)
GLUCOSE: 115 mg/dL — AB (ref 70–99)
Potassium: 3.5 mEq/L (ref 3.5–5.1)
SODIUM: 141 meq/L (ref 135–145)

## 2018-04-11 LAB — URINALYSIS
Bilirubin Urine: NEGATIVE
Hgb urine dipstick: NEGATIVE
KETONES UR: NEGATIVE
Leukocytes, UA: NEGATIVE
Nitrite: NEGATIVE
PH: 6 (ref 5.0–8.0)
TOTAL PROTEIN, URINE-UPE24: NEGATIVE
URINE GLUCOSE: NEGATIVE
Urobilinogen, UA: 0.2 (ref 0.0–1.0)

## 2018-04-11 LAB — CBC WITH DIFFERENTIAL/PLATELET
Basophils Absolute: 0.1 10*3/uL (ref 0.0–0.1)
Basophils Relative: 0.8 % (ref 0.0–3.0)
EOS PCT: 2 % (ref 0.0–5.0)
Eosinophils Absolute: 0.1 10*3/uL (ref 0.0–0.7)
HCT: 38.9 % (ref 36.0–46.0)
HEMOGLOBIN: 12.7 g/dL (ref 12.0–15.0)
LYMPHS ABS: 2.7 10*3/uL (ref 0.7–4.0)
Lymphocytes Relative: 40.9 % (ref 12.0–46.0)
MCHC: 32.6 g/dL (ref 30.0–36.0)
MCV: 87.9 fl (ref 78.0–100.0)
MONOS PCT: 7.9 % (ref 3.0–12.0)
Monocytes Absolute: 0.5 10*3/uL (ref 0.1–1.0)
NEUTROS PCT: 48.4 % (ref 43.0–77.0)
Neutro Abs: 3.2 10*3/uL (ref 1.4–7.7)
Platelets: 436 10*3/uL — ABNORMAL HIGH (ref 150.0–400.0)
RBC: 4.43 Mil/uL (ref 3.87–5.11)
RDW: 15 % (ref 11.5–15.5)
WBC: 6.5 10*3/uL (ref 4.0–10.5)

## 2018-04-11 LAB — HEPATIC FUNCTION PANEL
ALBUMIN: 4.3 g/dL (ref 3.5–5.2)
ALT: 46 U/L — AB (ref 0–35)
AST: 24 U/L (ref 0–37)
Alkaline Phosphatase: 108 U/L (ref 39–117)
BILIRUBIN TOTAL: 0.4 mg/dL (ref 0.2–1.2)
Bilirubin, Direct: 0.1 mg/dL (ref 0.0–0.3)
Total Protein: 7.8 g/dL (ref 6.0–8.3)

## 2018-04-11 LAB — MAGNESIUM: Magnesium: 1.5 mg/dL (ref 1.5–2.5)

## 2018-04-11 LAB — LIPID PANEL
Cholesterol: 172 mg/dL (ref 0–200)
HDL: 38.8 mg/dL — ABNORMAL LOW (ref >39.00)
LDL Cholesterol: 117 mg/dL — ABNORMAL HIGH (ref 0–99)
NONHDL: 133.1
Total CHOL/HDL Ratio: 4
Triglycerides: 82 mg/dL (ref 0.0–149.0)
VLDL: 16.4 mg/dL (ref 0.0–40.0)

## 2018-04-11 LAB — TSH: TSH: 3.78 u[IU]/mL (ref 0.35–4.50)

## 2018-04-11 LAB — HEMOGLOBIN A1C: Hgb A1c MFr Bld: 7.4 % — ABNORMAL HIGH (ref 4.6–6.5)

## 2018-04-11 MED ORDER — AMLODIPINE BESYLATE 5 MG PO TABS: 5.0000 mg | ORAL_TABLET | Freq: Every day | ORAL | 11 refills | Status: DC

## 2018-04-11 MED ORDER — METFORMIN HCL 500 MG PO TABS: 500.0000 mg | ORAL_TABLET | Freq: Two times a day (BID) | ORAL | 3 refills | Status: DC

## 2018-04-11 MED ORDER — GLUCOSE BLOOD VI STRP: 1.0000 | ORAL_STRIP | Freq: Every day | 12 refills | Status: DC

## 2018-04-11 MED ORDER — TRIAMCINOLONE 0.1 % CREAM:EUCERIN CREAM 1:1: 1.0000 "application " | TOPICAL_CREAM | Freq: Two times a day (BID) | CUTANEOUS | 2 refills | Status: DC

## 2018-04-11 MED ORDER — GLUCOSE BLOOD VI STRP: ORAL_STRIP | 3 refills | Status: DC

## 2018-04-11 MED ORDER — VITAMIN D 1000 UNITS PO TABS: 1000.0000 [IU] | ORAL_TABLET | Freq: Every day | ORAL | 11 refills | Status: AC

## 2018-04-11 MED ORDER — CLOTRIMAZOLE-BETAMETHASONE 1-0.05 % EX CREA: 1.0000 "application " | TOPICAL_CREAM | Freq: Two times a day (BID) | CUTANEOUS | 0 refills | Status: DC

## 2018-04-11 NOTE — Addendum Note (Signed)
Addended by: Earnstine Regal on: 04/11/2018 08:52 AM   Modules accepted: Orders

## 2018-04-11 NOTE — Assessment & Plan Note (Signed)
Metformin 

## 2018-04-11 NOTE — Assessment & Plan Note (Signed)
Tylenol pm Labs

## 2018-04-11 NOTE — Progress Notes (Signed)
Faxed script to [email or web address]

## 2018-04-11 NOTE — Addendum Note (Signed)
Addended by: Earnstine Regal on: 04/11/2018 08:51 AM   Modules accepted: Orders

## 2018-04-11 NOTE — Progress Notes (Signed)
Faxed script for testing strips to [facility].Marland KitchenJohny Chess

## 2018-04-11 NOTE — Patient Instructions (Signed)
Take Vitamin D 1000 iu a day

## 2018-04-11 NOTE — Progress Notes (Signed)
Subjective:  Patient ID: Renee Mann, female    DOB: 27-Feb-1956  Age: 62 y.o. MRN: [id number]  CC: No chief complaint on file.   HPI Renee Mann presents for HTN, DM C/o cramps in hands x weeks   Outpatient Medications Prior to Visit  Medication Sig Dispense Refill  . colchicine (COLCRYS) 0.6 MG tablet Take 1 tablet (0.6 mg total) by mouth daily. 30 tablet 11  . EPINEPHrine (EPIPEN 2-PAK) 0.3 mg/0.3 mL IJ SOAJ injection Use as directed for severe allergic reaction 2 Device 1  . hydrOXYzine (ATARAX/VISTARIL) 25 MG tablet Take 1-2 tablets (25-50 mg total) by mouth every 4 (four) hours as needed for itching. 60 tablet 1  . ONETOUCH DELICA LANCETS FINE MISC Use 1 times a day PRN. DX: E11.9 100 each 3  . Triamcinolone Acetonide (TRIAMCINOLONE 0.1 % CREAM : EUCERIN) CREA Apply 1 application topically 2 (two) times daily. 1 each 2  . glucose blood test strip Use 1 times a day PRN. DX: E11.9 100 each 3  . metFORMIN (GLUCOPHAGE) 500 MG tablet TAKE 1 TABLET BY MOUTH TWICE DAILY WITH MEALS 180 tablet 3  . amLODipine (NORVASC) 5 MG tablet Take 1 tablet (5 mg total) by mouth daily. 30 tablet 11   Facility-Administered Medications Prior to Visit  Medication Dose Route Frequency Provider Last Rate Last Dose  . omalizumab Arvid Right) injection 300 mg  300 mg Subcutaneous Q28 days [name], MD   300 mg at 03/22/18 1829    ROS: Review of Systems  Constitutional: Negative for activity change, appetite change, chills, fatigue and unexpected weight change.  HENT: Negative for congestion, mouth sores and sinus pressure.   Eyes: Negative for visual disturbance.  Respiratory: Negative for cough and chest tightness.   Gastrointestinal: Negative for abdominal pain and nausea.  Genitourinary: Negative for difficulty urinating, frequency and vaginal pain.  Musculoskeletal: Positive for myalgias. Negative for arthralgias, back pain and gait problem.  Skin: Negative for pallor and rash.  Neurological:  Negative for dizziness, tremors, weakness, numbness and headaches.  Psychiatric/Behavioral: Negative for confusion, sleep disturbance and suicidal ideas.    Objective:  BP 136/78 (BP Location: Left Arm, Patient Position: Sitting, Cuff Size: Normal)   Pulse 81   Temp 98.1 F (36.7 C) (Oral)   Ht 4' 10.5" (1.486 m)   Wt 153 lb (69.4 kg)   SpO2 99%   BMI 31.43 kg/m   BP Readings from Last 3 Encounters:  04/11/18 136/78  03/29/18 136/70  03/23/18 (!) 161/93    Wt Readings from Last 3 Encounters:  04/11/18 153 lb (69.4 kg)  03/29/18 157 lb 2.1 oz (71.3 kg)  03/23/18 154 lb 6.4 oz (70 kg)    Physical Exam  Constitutional: She appears well-developed. No distress.  HENT:  Head: Normocephalic.  Right Ear: External ear normal.  Left Ear: External ear normal.  Nose: Nose normal.  Mouth/Throat: Oropharynx is clear and moist.  Eyes: Pupils are equal, round, and reactive to light. Conjunctivae are normal. Right eye exhibits no discharge. Left eye exhibits no discharge.  Neck: Normal range of motion. Neck supple. No JVD present. No tracheal deviation present. No thyromegaly present.  Cardiovascular: Normal rate, regular rhythm and normal heart sounds.  Pulmonary/Chest: No stridor. No respiratory distress. She has no wheezes.  Abdominal: Soft. Bowel sounds are normal. She exhibits no distension and no mass. There is no tenderness. There is no rebound and no guarding.  Musculoskeletal: She exhibits no edema or tenderness.  Lymphadenopathy:    She has no cervical adenopathy.  Neurological: She displays normal reflexes. No cranial nerve deficit. She exhibits normal muscle tone. Coordination normal.  Skin: No rash noted. No erythema.  Psychiatric: She has a normal mood and affect. Her behavior is normal. Judgment and thought content normal.    Lab Results  Component Value Date   WBC 10.3 (A) 03/23/2018   HGB 12.4 03/23/2018   HCT 40.6 03/23/2018   PLT 423.0 (H) 10/11/2017   GLUCOSE  116 (H) 10/11/2017   CHOL 153 10/11/2017   TRIG 83.0 10/11/2017   HDL 33.20 (L) 10/11/2017   LDLCALC 104 (H) 10/11/2017   ALT 13 10/11/2017   AST 12 10/11/2017   NA 141 10/11/2017   K 4.0 10/11/2017   CL 102 10/11/2017   CREATININE 0.74 10/11/2017   BUN 10 10/11/2017   CO2 30 10/11/2017   TSH 3.02 10/11/2017   HGBA1C 7.4 (H) 10/11/2017   MICROALBUR <0.7 04/17/2016    No results found.  Assessment & Plan:   There are no diagnoses linked to this encounter.   Meds ordered this encounter  Medications  . amLODipine (NORVASC) 5 MG tablet    Sig: Take 1 tablet (5 mg total) by mouth daily.    Dispense:  30 tablet    Refill:  11  . glucose blood test strip    Sig: Use 1 times a day PRN. DX: E11.9    Dispense:  100 each    Refill:  3  . metFORMIN (GLUCOPHAGE) 500 MG tablet    Sig: Take 1 tablet (500 mg total) by mouth 2 (two) times daily with a meal.    Dispense:  180 tablet    Refill:  3  . clotrimazole-betamethasone (LOTRISONE) cream    Sig: Apply 1 application topically 2 (two) times daily. X 1 wk only    Dispense:  30 g    Refill:  0     Follow-up: No follow-ups on file.  Walker Kehr, MD

## 2018-04-11 NOTE — Progress Notes (Signed)
Faxed script to [facility]Marland KitchenJohny Mann

## 2018-04-11 NOTE — Assessment & Plan Note (Signed)
Norvasc

## 2018-04-11 NOTE — Addendum Note (Signed)
Addended by: Earnstine Regal on: 04/11/2018 08:45 AM   Modules accepted: Orders

## 2018-04-11 NOTE — Addendum Note (Signed)
Addended by: Earnstine Regal on: 04/11/2018 08:46 AM   Modules accepted: Orders

## 2018-04-19 ENCOUNTER — Ambulatory Visit (INDEPENDENT_AMBULATORY_CARE_PROVIDER_SITE_OTHER): Payer: 59 | Admitting: *Deleted

## 2018-04-19 DIAGNOSIS — L501 Idiopathic urticaria: Secondary | ICD-10-CM | POA: Diagnosis not present

## 2018-05-05 DIAGNOSIS — L501 Idiopathic urticaria: Secondary | ICD-10-CM | POA: Diagnosis not present

## 2018-05-17 ENCOUNTER — Ambulatory Visit (INDEPENDENT_AMBULATORY_CARE_PROVIDER_SITE_OTHER): Payer: 59 | Admitting: *Deleted

## 2018-05-17 DIAGNOSIS — L501 Idiopathic urticaria: Secondary | ICD-10-CM

## 2018-05-27 DIAGNOSIS — Z1231 Encounter for screening mammogram for malignant neoplasm of breast: Secondary | ICD-10-CM | POA: Diagnosis not present

## 2018-06-08 DIAGNOSIS — L501 Idiopathic urticaria: Secondary | ICD-10-CM | POA: Diagnosis not present

## 2018-06-08 IMAGING — DX DG WRIST COMPLETE 3+V*L*
4 series · 4 of 4 positions shown · non-contrast
Comparison: No prior .

CLINICAL DATA: Left wrist pain.

EXAM:
LEFT WRIST - COMPLETE 3+ VIEW

[wrist pa]
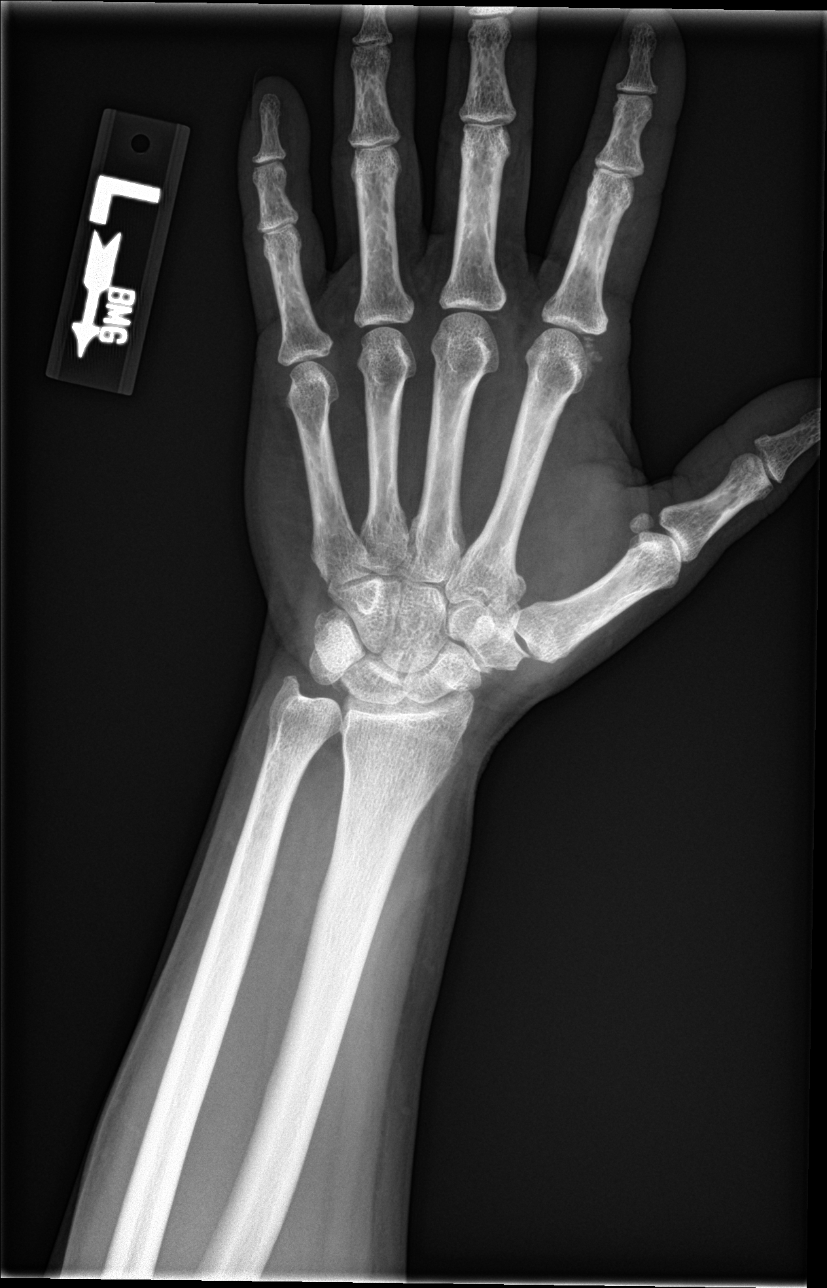

[wrist obl]
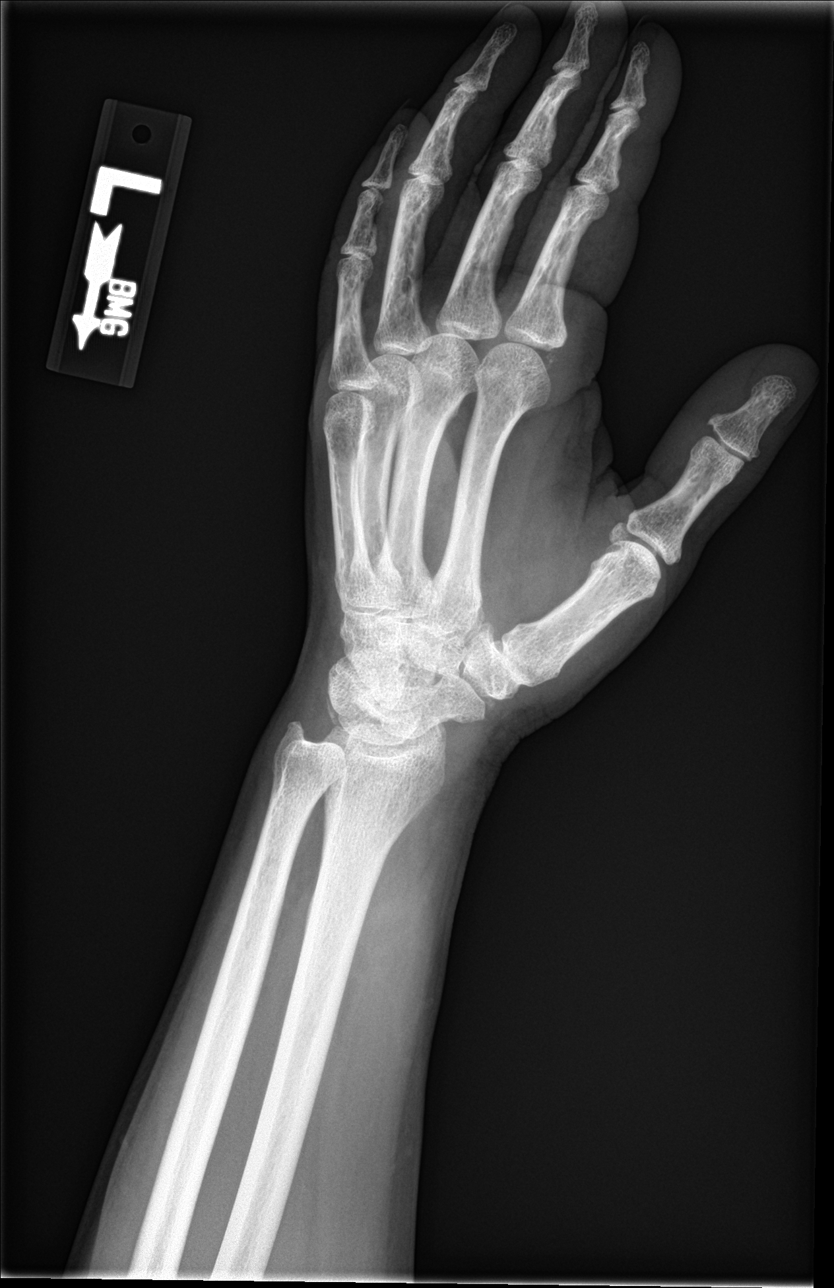

[wrist lat]
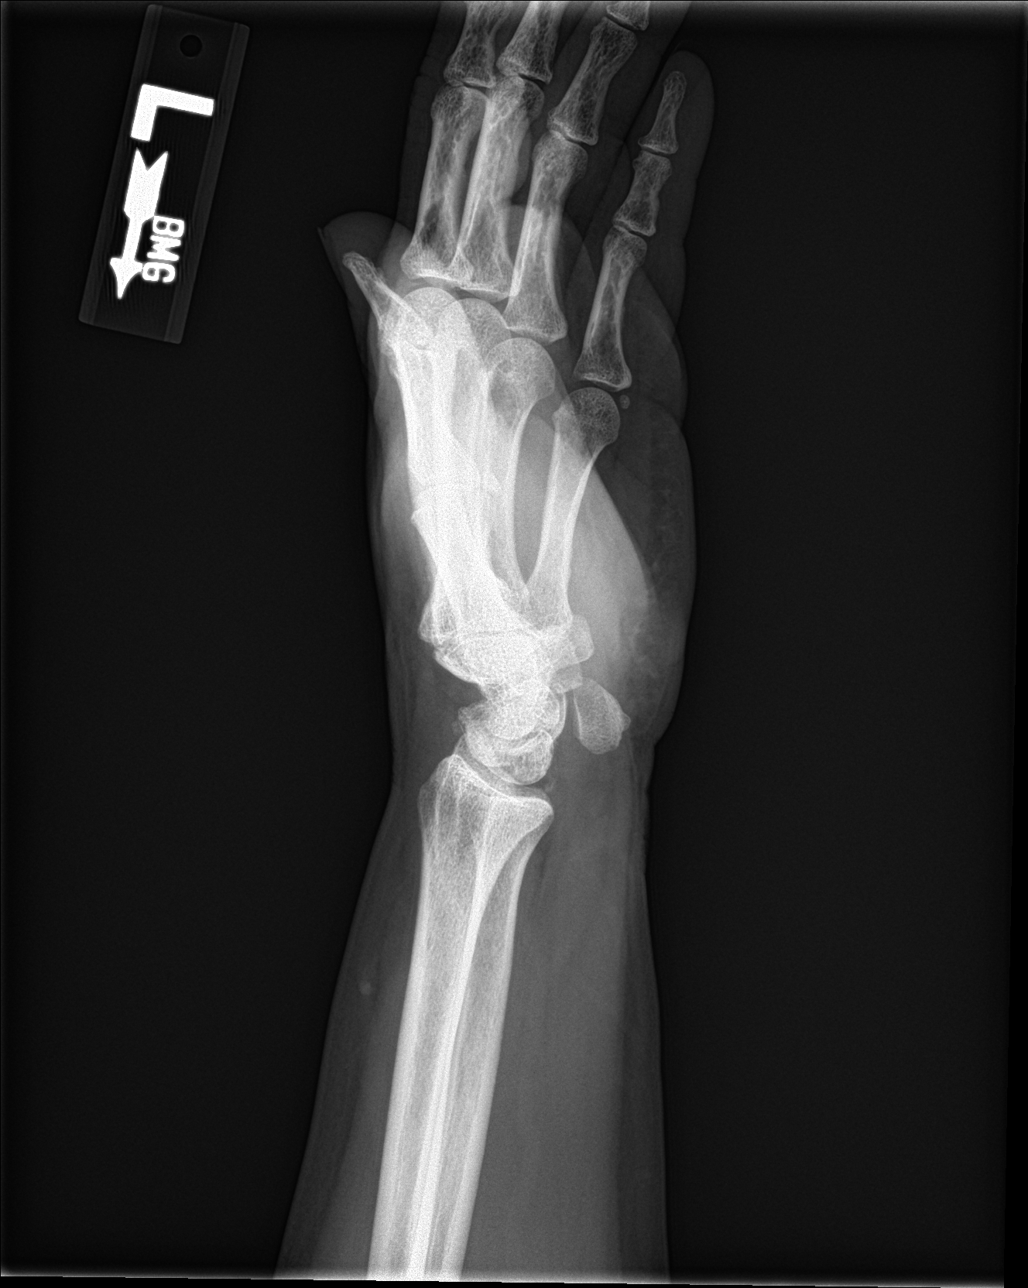

[wrist navicular]
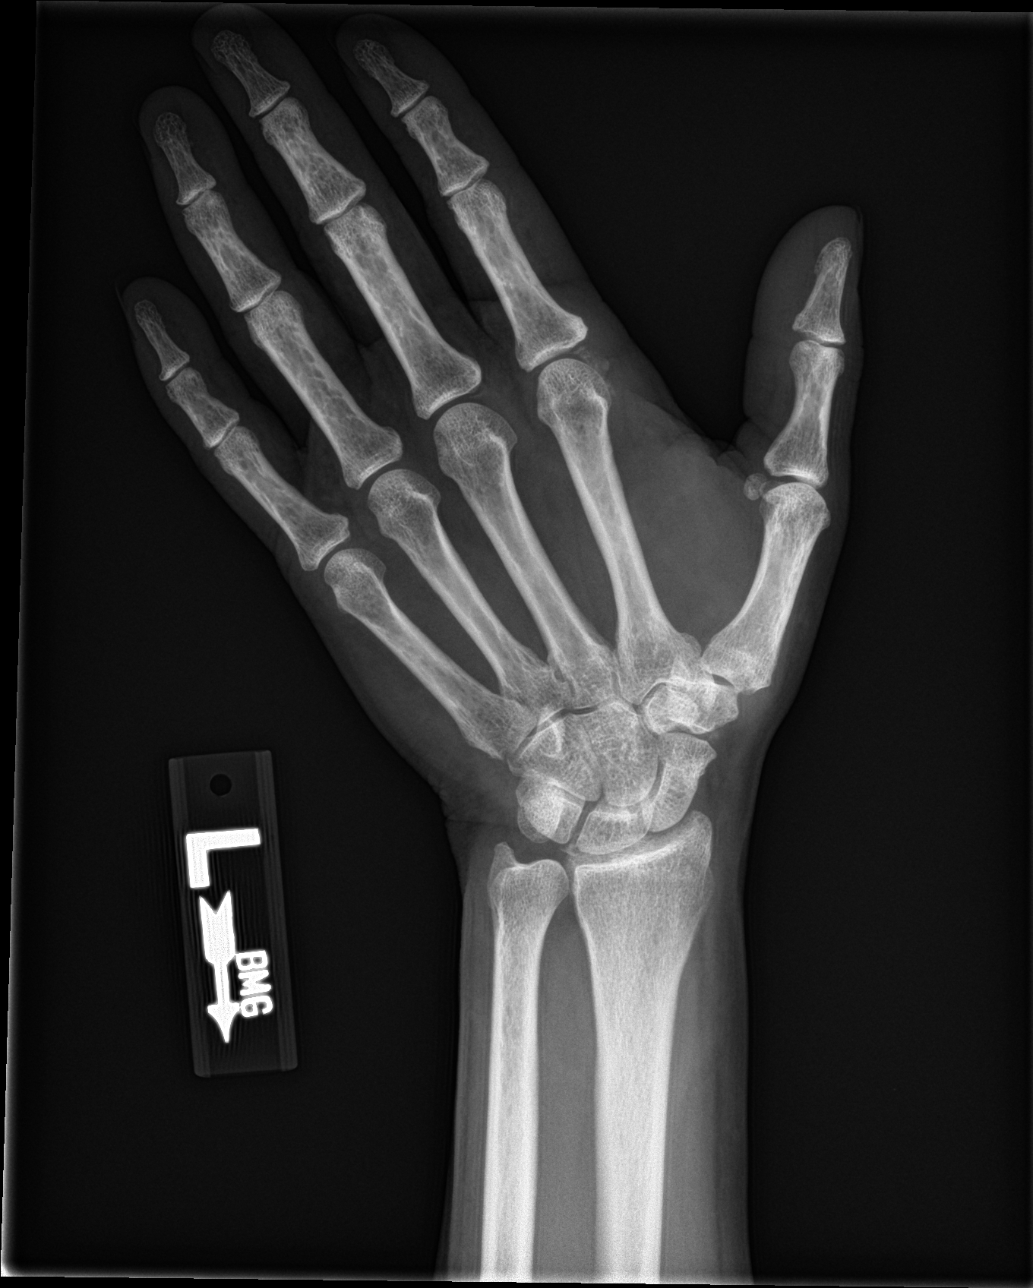

[4 of 4 positions shown; findings below may reference images not displayed]

FINDINGS: No acute bony or joint abnormality identified. No evidence of
fracture or dislocation. Calcification noted adjacent to the left
first metatarsophalangeal joint, most likely dystrophic and related
to degenerative change or prior injury.
IMPRESSION: Diffuse degenerative change. No evidence of fracture or dislocation.
No acute bony abnormality.

## 2018-06-14 ENCOUNTER — Ambulatory Visit: Payer: 59 | Admitting: *Deleted

## 2018-06-14 ENCOUNTER — Ambulatory Visit (INDEPENDENT_AMBULATORY_CARE_PROVIDER_SITE_OTHER): Payer: 59

## 2018-06-14 DIAGNOSIS — L501 Idiopathic urticaria: Secondary | ICD-10-CM

## 2018-06-14 DIAGNOSIS — L509 Urticaria, unspecified: Secondary | ICD-10-CM

## 2018-07-12 ENCOUNTER — Ambulatory Visit (INDEPENDENT_AMBULATORY_CARE_PROVIDER_SITE_OTHER): Payer: 59 | Admitting: *Deleted

## 2018-07-12 DIAGNOSIS — L501 Idiopathic urticaria: Secondary | ICD-10-CM

## 2018-07-13 ENCOUNTER — Other Ambulatory Visit: Payer: Self-pay | Admitting: Internal Medicine

## 2018-08-03 DIAGNOSIS — L501 Idiopathic urticaria: Secondary | ICD-10-CM | POA: Diagnosis not present

## 2018-08-09 ENCOUNTER — Ambulatory Visit (INDEPENDENT_AMBULATORY_CARE_PROVIDER_SITE_OTHER): Payer: 59 | Admitting: *Deleted

## 2018-08-09 DIAGNOSIS — L501 Idiopathic urticaria: Secondary | ICD-10-CM | POA: Diagnosis not present

## 2018-08-29 DIAGNOSIS — L501 Idiopathic urticaria: Secondary | ICD-10-CM | POA: Diagnosis not present

## 2018-09-06 ENCOUNTER — Ambulatory Visit (INDEPENDENT_AMBULATORY_CARE_PROVIDER_SITE_OTHER): Payer: 59

## 2018-09-06 DIAGNOSIS — L509 Urticaria, unspecified: Secondary | ICD-10-CM

## 2018-09-28 DIAGNOSIS — L501 Idiopathic urticaria: Secondary | ICD-10-CM | POA: Diagnosis not present

## 2018-10-04 ENCOUNTER — Ambulatory Visit (INDEPENDENT_AMBULATORY_CARE_PROVIDER_SITE_OTHER): Payer: 59 | Admitting: *Deleted

## 2018-10-04 DIAGNOSIS — L501 Idiopathic urticaria: Secondary | ICD-10-CM

## 2018-10-10 ENCOUNTER — Other Ambulatory Visit: Payer: Self-pay | Admitting: Internal Medicine

## 2018-10-10 ENCOUNTER — Encounter: Payer: Self-pay | Admitting: Internal Medicine

## 2018-10-10 ENCOUNTER — Other Ambulatory Visit: Payer: Self-pay | Admitting: Allergy and Immunology

## 2018-10-10 ENCOUNTER — Other Ambulatory Visit (INDEPENDENT_AMBULATORY_CARE_PROVIDER_SITE_OTHER): Payer: 59

## 2018-10-10 ENCOUNTER — Ambulatory Visit: Payer: 59 | Admitting: Internal Medicine

## 2018-10-10 VITALS — BP 132/80 | HR 92 | Temp 98.0°F | Ht 58.5 in | Wt 155.0 lb

## 2018-10-10 DIAGNOSIS — M25552 Pain in left hip: Secondary | ICD-10-CM | POA: Diagnosis not present

## 2018-10-10 DIAGNOSIS — Z0000 Encounter for general adult medical examination without abnormal findings: Secondary | ICD-10-CM

## 2018-10-10 DIAGNOSIS — I10 Essential (primary) hypertension: Secondary | ICD-10-CM | POA: Diagnosis not present

## 2018-10-10 DIAGNOSIS — R000 Tachycardia, unspecified: Secondary | ICD-10-CM

## 2018-10-10 DIAGNOSIS — E119 Type 2 diabetes mellitus without complications: Secondary | ICD-10-CM | POA: Diagnosis not present

## 2018-10-10 LAB — HEPATIC FUNCTION PANEL
ALT: 24 U/L (ref 0–35)
AST: 17 U/L (ref 0–37)
Albumin: 4.4 g/dL (ref 3.5–5.2)
Alkaline Phosphatase: 121 U/L — ABNORMAL HIGH (ref 39–117)
Bilirubin, Direct: 0.1 mg/dL (ref 0.0–0.3)
Total Bilirubin: 0.4 mg/dL (ref 0.2–1.2)
Total Protein: 7.4 g/dL (ref 6.0–8.3)

## 2018-10-10 LAB — CBC WITH DIFFERENTIAL/PLATELET
BASOS PCT: 0.7 % (ref 0.0–3.0)
Basophils Absolute: 0.1 10*3/uL (ref 0.0–0.1)
Eosinophils Absolute: 0.1 10*3/uL (ref 0.0–0.7)
Eosinophils Relative: 1.3 % (ref 0.0–5.0)
HCT: 41.3 % (ref 36.0–46.0)
Hemoglobin: 13.4 g/dL (ref 12.0–15.0)
Lymphocytes Relative: 29.7 % (ref 12.0–46.0)
Lymphs Abs: 2.4 10*3/uL (ref 0.7–4.0)
MCHC: 32.5 g/dL (ref 30.0–36.0)
MCV: 88.8 fl (ref 78.0–100.0)
Monocytes Absolute: 0.5 10*3/uL (ref 0.1–1.0)
Monocytes Relative: 6.1 % (ref 3.0–12.0)
Neutro Abs: 5 10*3/uL (ref 1.4–7.7)
Neutrophils Relative %: 62.2 % (ref 43.0–77.0)
Platelets: 466 10*3/uL — ABNORMAL HIGH (ref 150.0–400.0)
RBC: 4.64 Mil/uL (ref 3.87–5.11)
RDW: 14.5 % (ref 11.5–15.5)
WBC: 8 10*3/uL (ref 4.0–10.5)

## 2018-10-10 LAB — URINALYSIS
Bilirubin Urine: NEGATIVE
Hgb urine dipstick: NEGATIVE
Ketones, ur: NEGATIVE
Leukocytes,Ua: NEGATIVE
Nitrite: NEGATIVE
Total Protein, Urine: NEGATIVE
Urine Glucose: NEGATIVE
Urobilinogen, UA: 0.2 (ref 0.0–1.0)
pH: 6 (ref 5.0–8.0)

## 2018-10-10 LAB — BASIC METABOLIC PANEL
BUN: 8 mg/dL (ref 6–23)
CO2: 29 mEq/L (ref 19–32)
CREATININE: 0.73 mg/dL (ref 0.40–1.20)
Calcium: 9.9 mg/dL (ref 8.4–10.5)
Chloride: 100 mEq/L (ref 96–112)
GFR: 97.07 mL/min (ref >60.00)
Glucose, Bld: 108 mg/dL — ABNORMAL HIGH (ref 70–99)
Potassium: 4.1 mEq/L (ref 3.5–5.1)
Sodium: 140 mEq/L (ref 135–145)

## 2018-10-10 LAB — HEMOGLOBIN A1C: Hgb A1c MFr Bld: 7.4 % — ABNORMAL HIGH (ref 4.6–6.5)

## 2018-10-10 LAB — MICROALBUMIN / CREATININE URINE RATIO
Creatinine,U: 13.1 mg/dL
Microalb Creat Ratio: 5.3 mg/g (ref 0.0–30.0)
Microalb, Ur: <0.7 mg/dL (ref 0.0–1.9)

## 2018-10-10 LAB — LIPID PANEL
CHOLESTEROL: 165 mg/dL (ref 0–200)
HDL: 36.4 mg/dL — ABNORMAL LOW (ref >39.00)
LDL Cholesterol: 115 mg/dL — ABNORMAL HIGH (ref 0–99)
NonHDL: 129.06
Total CHOL/HDL Ratio: 5
Triglycerides: 71 mg/dL (ref 0.0–149.0)
VLDL: 14.2 mg/dL (ref 0.0–40.0)

## 2018-10-10 LAB — TSH: TSH: 2.2 u[IU]/mL (ref 0.35–4.50)

## 2018-10-10 MED ORDER — METFORMIN HCL ER 750 MG PO TB24: 750.0000 mg | ORAL_TABLET | Freq: Every day | ORAL | 11 refills | Status: DC

## 2018-10-10 MED ORDER — CLOTRIMAZOLE-BETAMETHASONE 1-0.05 % EX CREA
1.0000 | TOPICAL_CREAM | Freq: Two times a day (BID) | CUTANEOUS | 0 refills | Status: DC
Start: 2018-10-10 — End: 2019-04-11

## 2018-10-10 MED ORDER — ACCU-CHEK MULTICLIX LANCETS MISC: 12 refills | Status: DC

## 2018-10-10 MED ORDER — GLUCOSE BLOOD VI STRP: ORAL_STRIP | 12 refills | Status: DC

## 2018-10-10 MED ORDER — ACCU-CHEK AVIVA PLUS W/DEVICE KIT: PACK | 0 refills | Status: DC

## 2018-10-10 NOTE — Assessment & Plan Note (Addendum)
We discussed age appropriate health related issues, including available/recomended screening tests and vaccinations- declined. We discussed a need for adhering to healthy diet and exercise. Labs were ordered to be later reviewed . All questions were answered. A cardiac CT scan for calcium scoring offered 2/20 EKG Colon 2017 mammo q 12 mo

## 2018-10-10 NOTE — Patient Instructions (Signed)

## 2018-10-10 NOTE — Assessment & Plan Note (Signed)
A cardiac CT scan for calcium scoring offered 

## 2018-10-10 NOTE — Assessment & Plan Note (Addendum)
L side -- options discussed. Ref to Dr Wynelle Link

## 2018-10-10 NOTE — Assessment & Plan Note (Signed)
S tachy EKG  Pt declined rx

## 2018-10-10 NOTE — Progress Notes (Signed)
Subjective:  Patient ID: Renee Mann, female    DOB: 1956/02/11  Age: 63 y.o. MRN: [id number]  CC: No chief complaint on file.   HPI Renee Mann presents for a well exam. The pt is requesting EKG C/o L hip pain: can't lay on the L  Outpatient Medications Prior to Visit  Medication Sig Dispense Refill  . amLODipine (NORVASC) 5 MG tablet Take 1 tablet (5 mg total) by mouth daily. 30 tablet 11  . cholecalciferol (VITAMIN D) 1000 units tablet Take 1 tablet (1,000 Units total) by mouth daily. 30 tablet 11  . clotrimazole-betamethasone (LOTRISONE) cream Apply 1 application topically 2 (two) times daily. X 1 wk only 30 g 0  . colchicine (COLCRYS) 0.6 MG tablet Take 1 tablet (0.6 mg total) by mouth daily. 30 tablet 11  . EPINEPHrine (EPIPEN 2-PAK) 0.3 mg/0.3 mL IJ SOAJ injection Use as directed for severe allergic reaction 2 Device 1  . glucose blood (ONE TOUCH ULTRA TEST) test strip 1 each by Other route daily. Use to check blood sugars once a day 100 each 12  . glucose blood test strip Use 1 times a day PRN. DX: E11.9 100 each 3  . hydrOXYzine (ATARAX/VISTARIL) 25 MG tablet Take 1-2 tablets (25-50 mg total) by mouth every 4 (four) hours as needed for itching. 60 tablet 1  . metFORMIN (GLUCOPHAGE) 500 MG tablet Take 1 tablet (500 mg total) by mouth 2 (two) times daily with a meal. 180 tablet 3  . ONETOUCH DELICA LANCETS FINE MISC USE 1  TO CHECK GLUCOSE ONCE DAILY PRN 100 each 3  . Triamcinolone Acetonide (TRIAMCINOLONE 0.1 % CREAM : EUCERIN) CREA Apply 1 application topically 2 (two) times daily. 1 each 2   Facility-Administered Medications Prior to Visit  Medication Dose Route Frequency Provider Last Rate Last Dose  . omalizumab Arvid Right) injection 300 mg  300 mg Subcutaneous Q28 days [name], MD   300 mg at 10/04/18 1717    ROS: Review of Systems  Constitutional: Negative for activity change, appetite change, chills, diaphoresis, fatigue, fever and unexpected weight change.    HENT: Negative for congestion, dental problem, ear pain, hearing loss, mouth sores, postnasal drip, sinus pressure, sneezing, sore throat and voice change.   Eyes: Negative for pain and visual disturbance.  Respiratory: Negative for cough, chest tightness, wheezing and stridor.   Cardiovascular: Negative for chest pain, palpitations and leg swelling.  Gastrointestinal: Negative for abdominal distention, abdominal pain, blood in stool, nausea, rectal pain and vomiting.  Genitourinary: Negative for decreased urine volume, difficulty urinating, dysuria, frequency, hematuria, menstrual problem, vaginal bleeding, vaginal discharge and vaginal pain.  Musculoskeletal: Positive for arthralgias. Negative for back pain, gait problem, joint swelling and neck pain.  Skin: Negative for color change, rash and wound.  Neurological: Negative for dizziness, tremors, syncope, speech difficulty, weakness and light-headedness.  Hematological: Negative for adenopathy.  Psychiatric/Behavioral: Negative for behavioral problems, confusion, decreased concentration, dysphoric mood, hallucinations, sleep disturbance and suicidal ideas. The patient is not nervous/anxious and is not hyperactive.     Objective:  BP 132/80 (BP Location: Left Arm, Patient Position: Sitting, Cuff Size: Normal)   Pulse 92   Temp 98 F (36.7 C) (Oral)   Ht 4' 10.5" (1.486 m)   Wt 155 lb (70.3 kg)   SpO2 98%   BMI 31.84 kg/m   BP Readings from Last 3 Encounters:  10/10/18 132/80  04/11/18 136/78  03/29/18 136/70    Wt Readings from Last 3  Encounters:  10/10/18 155 lb (70.3 kg)  04/11/18 153 lb (69.4 kg)  03/29/18 157 lb 2.1 oz (71.3 kg)    Physical Exam Constitutional:      General: She is not in acute distress.    Appearance: She is well-developed.  HENT:     Head: Normocephalic.     Right Ear: External ear normal.     Left Ear: External ear normal.     Nose: Nose normal.  Eyes:     General:        Right eye: No  discharge.        Left eye: No discharge.     Conjunctiva/sclera: Conjunctivae normal.     Pupils: Pupils are equal, round, and reactive to light.  Neck:     Musculoskeletal: Normal range of motion and neck supple.     Thyroid: No thyromegaly.     Vascular: No JVD.     Trachea: No tracheal deviation.  Cardiovascular:     Rate and Rhythm: Regular rhythm. Tachycardia present.     Heart sounds: Normal heart sounds.  Pulmonary:     Effort: No respiratory distress.     Breath sounds: No stridor. No wheezing.  Abdominal:     General: Bowel sounds are normal. There is no distension.     Palpations: Abdomen is soft. There is no mass.     Tenderness: There is no abdominal tenderness. There is no guarding or rebound.  Musculoskeletal:        General: Tenderness present.  Lymphadenopathy:     Cervical: No cervical adenopathy.  Skin:    Findings: No erythema or rash.  Neurological:     Cranial Nerves: No cranial nerve deficit.     Motor: No abnormal muscle tone.     Coordination: Coordination normal.     Deep Tendon Reflexes: Reflexes normal.  Psychiatric:        Behavior: Behavior normal.        Thought Content: Thought content normal.        Judgment: Judgment normal.    L lat hip - tender Tachycardic Obese  Procedure: EKG Indication: well exam, no pain; tachycardic Impression: S tachycardia. No acute changes.   Lab Results  Component Value Date   WBC 6.5 04/11/2018   HGB 12.7 04/11/2018   HCT 38.9 04/11/2018   PLT 436.0 (H) 04/11/2018   GLUCOSE 115 (H) 04/11/2018   CHOL 172 04/11/2018   TRIG 82.0 04/11/2018   HDL 38.80 (L) 04/11/2018   LDLCALC 117 (H) 04/11/2018   ALT 46 (H) 04/11/2018   AST 24 04/11/2018   NA 141 04/11/2018   K 3.5 04/11/2018   CL 102 04/11/2018   CREATININE 0.81 04/11/2018   BUN 10 04/11/2018   CO2 27 04/11/2018   TSH 3.78 04/11/2018   HGBA1C 7.4 (H) 04/11/2018   MICROALBUR <0.7 04/17/2016    No results found.  Assessment & Plan:    There are no diagnoses linked to this encounter.   No orders of the defined types were placed in this encounter.    Follow-up: No follow-ups on file.  Walker Kehr, MD

## 2018-10-13 ENCOUNTER — Other Ambulatory Visit: Payer: Self-pay | Admitting: Internal Medicine

## 2018-10-13 DIAGNOSIS — E785 Hyperlipidemia, unspecified: Secondary | ICD-10-CM

## 2018-10-27 DIAGNOSIS — L501 Idiopathic urticaria: Secondary | ICD-10-CM | POA: Diagnosis not present

## 2018-11-01 ENCOUNTER — Ambulatory Visit (INDEPENDENT_AMBULATORY_CARE_PROVIDER_SITE_OTHER): Payer: 59 | Admitting: *Deleted

## 2018-11-01 DIAGNOSIS — L501 Idiopathic urticaria: Secondary | ICD-10-CM

## 2018-11-01 DIAGNOSIS — L509 Urticaria, unspecified: Secondary | ICD-10-CM

## 2018-11-08 ENCOUNTER — Other Ambulatory Visit: Payer: Self-pay | Admitting: Internal Medicine

## 2018-11-10 DIAGNOSIS — M25552 Pain in left hip: Secondary | ICD-10-CM | POA: Diagnosis not present

## 2018-11-15 ENCOUNTER — Inpatient Hospital Stay: Admission: RE | Admit: 2018-11-15 | Payer: 59 | Source: Ambulatory Visit

## 2018-11-29 ENCOUNTER — Other Ambulatory Visit: Payer: Self-pay

## 2018-11-29 ENCOUNTER — Ambulatory Visit (INDEPENDENT_AMBULATORY_CARE_PROVIDER_SITE_OTHER): Payer: 59 | Admitting: *Deleted

## 2018-11-29 DIAGNOSIS — L509 Urticaria, unspecified: Secondary | ICD-10-CM

## 2018-11-29 DIAGNOSIS — L501 Idiopathic urticaria: Secondary | ICD-10-CM

## 2018-12-20 DIAGNOSIS — L501 Idiopathic urticaria: Secondary | ICD-10-CM | POA: Diagnosis not present

## 2018-12-27 ENCOUNTER — Other Ambulatory Visit: Payer: Self-pay

## 2018-12-27 ENCOUNTER — Ambulatory Visit (INDEPENDENT_AMBULATORY_CARE_PROVIDER_SITE_OTHER): Payer: 59 | Admitting: *Deleted

## 2018-12-27 DIAGNOSIS — L501 Idiopathic urticaria: Secondary | ICD-10-CM

## 2018-12-27 DIAGNOSIS — L509 Urticaria, unspecified: Secondary | ICD-10-CM

## 2019-01-07 ENCOUNTER — Ambulatory Visit (HOSPITAL_COMMUNITY)
Admission: EM | Admit: 2019-01-07 | Discharge: 2019-01-07 | Disposition: A | Discharge status: Home / Self Care | Payer: 59 | Attending: Family Medicine | Admitting: Family Medicine

## 2019-01-07 ENCOUNTER — Other Ambulatory Visit: Payer: Self-pay

## 2019-01-07 ENCOUNTER — Encounter (HOSPITAL_COMMUNITY): Payer: Self-pay | Admitting: Emergency Medicine

## 2019-01-07 DIAGNOSIS — M654 Radial styloid tenosynovitis [de Quervain]: Secondary | ICD-10-CM | POA: Diagnosis not present

## 2019-01-07 MED ORDER — PREDNISONE 20 MG PO TABS: 40.0000 mg | ORAL_TABLET | Freq: Every day | ORAL | 0 refills | Status: AC

## 2019-01-07 NOTE — Discharge Instructions (Signed)
Ice/cold pack over area for 10-15 min twice daily.  OK to take Tylenol 1000 mg (2 extra strength tabs) or 975 mg (3 regular strength tabs) every 6 hours as needed.  Heat (pad or rice pillow in microwave) over affected area, 10-15 minutes twice daily.   Wrist and Forearm Exercises Do exercises exactly as told by your health care provider and adjust them as directed. It is normal to feel mild stretching, pulling, tightness, or discomfort as you do these exercises, but you should stop right away if you feel sudden pain or your pain gets worse.   RANGE OF MOTION EXERCISES These exercises warm up your muscles and joints and improve the movement and flexibility of your injured wrist and forearm. These exercises also help to relieve pain, numbness, and tingling. These exercises are done using the muscles in your injured wrist and forearm. Exercise A: Wrist Flexion, Active With your fingers relaxed, bend your wrist forward as far as you can. Hold this position for 30 seconds. Repeat 2 times. Complete this exercise 3 times per week. Exercise B: Wrist Extension, Active With your fingers relaxed, bend your wrist backward as far as you can. Hold this position for 30 seconds. Repeat 2 times. Complete this exercise 3 times per week. Exercise C: Supination, Active  Stand or sit with your arms at your sides. Bend your left / right elbow to an "L" shape (90 degrees). Turn your palm upward until you feel a gentle stretch on the inside of your forearm. Hold this position for 30 seconds. Slowly return your palm to the starting position. Repeat 2 times. Complete this exercise 3 times per week. Exercise D: Pronation, Active  Stand or sit with your arms at your sides. Bend your left / right elbow to an "L" shape (90 degrees). Turn your palm downward until you feel a gentle stretch on the top of your forearm. Hold this position for 30 seconds. Slowly return your palm to the starting position. Repeat 2  times. Complete this exercise once a day.  STRETCHING EXERCISES These exercises warm up your muscles and joints and improve the movement and flexibility of your injured wrist and forearm. These exercises also help to relieve pain, numbness, and tingling. These exercises are done using your healthy wrist and forearm to help stretch the muscles in your injured wrist and forearm. Exercise E: Wrist Flexion, Passive  Extend your left / right arm in front of you, relax your wrist, and point your fingers downward. Gently push on the back of your hand. Stop when you feel a gentle stretch on the top of your forearm. Hold this position for 30 seconds. Repeat 2 times. Complete this exercise 3 times per week. Exercise F: Wrist Extension, Passive  Extend your left / right arm in front of you and turn your palm upward. Gently pull your palm and fingertips back so your fingers point downward. You should feel a gentle stretch on the palm-side of your forearm. Hold this position for 30 seconds. Repeat 2 times. Complete this exercise 3 times per week. Exercise G: Forearm Rotation, Supination, Passive Sit with your left / right elbow bent to an "L" shape (90 degrees) with your forearm resting on a table. Keeping your upper body and shoulder still, use your other hand to rotate your forearm palm-up until you feel a gentle to moderate stretch. Hold this position for 30 seconds. Slowly release the stretch and return to the starting position. Repeat 2 times. Complete this exercise 3 times  per week. Exercise H: Forearm Rotation, Pronation, Passive Sit with your left / right elbow bent to an "L" shape (90 degrees) with your forearm resting on a table. Keeping your upper body and shoulder still, use your other hand to rotate your forearm palm-down until you feel a gentle to moderate stretch. Hold this position for 30 seconds. Slowly release the stretch and return to the starting position. Repeat 2 times. Complete  this exercise 3 times per week.  STRENGTHENING EXERCISES These exercises build strength and endurance in your wrist and forearm. Endurance is the ability to use your muscles for a long time, even after they get tired. Exercise I: Wrist Flexors  Sit with your left / right forearm supported on a table and your hand resting palm-up over the edge of the table. Your elbow should be bent to an "L" shape (about 90 degrees) and be below the level of your shoulder. Hold a 3-5 lb weight in your left / right hand. Or, hold a rubber exercise band or tube in both hands, keeping your hands at the same level and hip distance apart. There should be a slight tension in the exercise band or tube. Slowly curl your hand up toward your forearm. Hold this position for 3 seconds. Slowly lower your hand back to the starting position. Repeat 2 times. Complete this exercise 3 times per week. Exercise J: Wrist Extensors  Sit with your left / right forearm supported on a table and your hand resting palm-down over the edge of the table. Your elbow should be bent to an "L" shape (about 90 degrees) and be below the level of your shoulder. Hold a 3-5 lb weight in your left / right hand. Or, hold a rubber exercise band or tube in both hands, keeping your hands at the same level and hip distance apart. There should be a slight tension in the exercise band or tube. Slowly curl your hand up toward your forearm. Hold this position for 3 seconds. Slowly lower your hand back to the starting position. Repeat 2 times. Complete this exercise 3 times per week. Exercise K: Forearm Rotation, Supination  Sit with your left / right forearm supported on a table and your hand resting palm-down. Your elbow should be at your side, bent to an "L" shape (about 90 degrees), and below the level of your shoulder. Keep your wrist stable and in a neutral position throughout the exercise. Gently hold a lightweight hammer with your left / right  hand. Without moving your elbow or wrist, slowly rotate your palm upward to a thumbs-up position. Hold this position for 3 seconds. Slowly return your forearm to the starting position. Repeat 2 times. Complete this exercise 3 times per week. Exercise L: Forearm Rotation, Pronation  Sit with your left / right forearm supported on a table and your hand resting palm-up. Your elbow should be at your side, bent to an "L" shape (about 90 degrees), and below the level of your shoulder. Keep your wrist stable. Do not allow it to move backward or forward during the exercise. Gently hold a lightweight hammer with your left / right hand. Without moving your elbow or wrist, slowly rotate your palm and hand upward to a thumbs-up position. Hold this position for 3 seconds. Slowly return your forearm to the starting position. Repeat 2 times. Complete this exercise 3 times per week. Exercise M: Grip Strengthening  Hold one of these items in your left / right hand: play dough, therapy putty,  a dense sponge, a stress ball, or a large, rolled sock. Squeeze as hard as you can without increasing pain. Hold this position for 5 seconds. Slowly release your grip. Repeat 2 times. Complete this exercise 3 times per week.  This information is not intended to replace advice given to you by your health care provider. Make sure you discuss any questions you have with your health care provider. Document Released: 06/24/2005 Document Revised: 05/04/2016 Document Reviewed: 05/05/2015 Elsevier Interactive Patient Education  [employer].

## 2019-01-07 NOTE — ED Triage Notes (Signed)
Pt here for right hand pain with swelling; pt sts hx of gout but was a long time ago; denies injury

## 2019-01-07 NOTE — ED Provider Notes (Signed)
[place]    CSN: [id number] Arrival date & time: 01/07/19  1012     History    Musculoskeletal Exam  Patient: Renee Mann DOB: [birth year]  DOS: 01/07/2019  SUBJECTIVE:  Chief Complaint:   Chief Complaint  Patient presents with  . Hand Pain    Renee Mann is a 63 y.o.  female for evaluation and treatment of R hand pain.   Onset:  1 week ago. No inj or change in activity.  Location: R wrist at base of thumb Character:  aching  Progression of issue:  is unchanged Associated symptoms: swelling Treatment: to date has been ice, OTC NSAIDS, acetaminophen and heat.   Neurovascular symptoms: no  ROS: Musculoskeletal/Extremities: +R wrist pain  Past Medical History:  Diagnosis Date  . Diabetes mellitus 2008   type II  . Gastroesophageal reflux disease   . Hives   . Hypertension   . Osteoarthritis   . 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 No acute distress. Cardiovascular: Brisk cap refill Thorax & Lungs: No accessory muscle use Musculoskeletal: R wrist.   Normal active range of motion: no.   Normal passive range of motion: no Tenderness to palpation: yes over extensor tendon of thumb Deformity: no Ecchymosis: no Positive Finkelstein's Neurologic: Normal sensory function. No focal deficits noted.  Psychiatric: Normal mood. Age appropriate judgment and insight. Alert & oriented x 3.    Final Clinical Impressions(s) / UC Diagnoses   Final diagnoses:  De Quervain's tenosynovitis, right   Likely above. Low suspicion for infection.    Discharge Instructions     Ice/cold pack over area for 10-15 min twice daily.  OK to take Tylenol 1000 mg (2 extra strength tabs) or 975 mg (3 regular strength tabs) every 6 hours as needed.  Heat (pad or rice pillow in microwave) over  affected area, 10-15 minutes twice daily.   Wrist and Forearm Exercises Do exercises exactly as told by your health care provider and adjust them as directed. It is normal to feel mild stretching, pulling, tightness, or discomfort as you do these exercises, but you should stop right away if you feel sudden pain or your pain gets worse.   RANGE OF MOTION EXERCISES These exercises warm up your muscles and joints and improve the movement and flexibility of your injured wrist and forearm. These exercises also help to relieve pain, numbness, and tingling. These exercises are done using the muscles in your injured wrist and forearm. Exercise A: Wrist Flexion, Active 1. With your fingers relaxed, bend your wrist forward as far as you can. 2. Hold this position for 30 seconds. Repeat 2 times. Complete this exercise 3 times per week. Exercise B: Wrist Extension, Active 1. With your fingers relaxed, bend your wrist backward as far as you can. 2. Hold this position for 30 seconds. Repeat 2 times. Complete this exercise 3 times per week. Exercise C: Supination, Active  1. Stand or sit with your arms at your sides. 2. Bend your left / right elbow to an "L" shape (90 degrees). 3. Turn your palm upward until you feel a gentle stretch on the inside of your forearm. 4. Hold this position for 30 seconds. 5. Slowly return your palm to the starting  position. Repeat 2 times. Complete this exercise 3 times per week. Exercise D: Pronation, Active  1. Stand or sit with your arms at your sides. 2. Bend your left / right elbow to an "L" shape (90 degrees). 3. Turn your palm downward until you feel a gentle stretch on the top of your forearm. 4. Hold this position for 30 seconds. 5. Slowly return your palm to the starting position. Repeat 2 times. Complete this exercise once a day.  STRETCHING EXERCISES These exercises warm up your muscles and joints and improve the movement and flexibility of your injured  wrist and forearm. These exercises also help to relieve pain, numbness, and tingling. These exercises are done using your healthy wrist and forearm to help stretch the muscles in your injured wrist and forearm. Exercise E: Wrist Flexion, Passive  1. Extend your left / right arm in front of you, relax your wrist, and point your fingers downward. 2. Gently push on the back of your hand. Stop when you feel a gentle stretch on the top of your forearm. 3. Hold this position for 30 seconds. Repeat 2 times. Complete this exercise 3 times per week. Exercise F: Wrist Extension, Passive  1. Extend your left / right arm in front of you and turn your palm upward. 2. Gently pull your palm and fingertips back so your fingers point downward. You should feel a gentle stretch on the palm-side of your forearm. 3. Hold this position for 30 seconds. Repeat 2 times. Complete this exercise 3 times per week. Exercise G: Forearm Rotation, Supination, Passive 1. Sit with your left / right elbow bent to an "L" shape (90 degrees) with your forearm resting on a table. 2. Keeping your upper body and shoulder still, use your other hand to rotate your forearm palm-up until you feel a gentle to moderate stretch. 3. Hold this position for 30 seconds. 4. Slowly release the stretch and return to the starting position. Repeat 2 times. Complete this exercise 3 times per week. Exercise H: Forearm Rotation, Pronation, Passive 1. Sit with your left / right elbow bent to an "L" shape (90 degrees) with your forearm resting on a table. 2. Keeping your upper body and shoulder still, use your other hand to rotate your forearm palm-down until you feel a gentle to moderate stretch. 3. Hold this position for 30 seconds. 4. Slowly release the stretch and return to the starting position. Repeat 2 times. Complete this exercise 3 times per week.  STRENGTHENING EXERCISES These exercises build strength and endurance in your wrist and forearm.  Endurance is the ability to use your muscles for a long time, even after they get tired. Exercise I: Wrist Flexors  1. Sit with your left / right forearm supported on a table and your hand resting palm-up over the edge of the table. Your elbow should be bent to an "L" shape (about 90 degrees) and be below the level of your shoulder. 2. Hold a 3-5 lb weight in your left / right hand. Or, hold a rubber exercise band or tube in both hands, keeping your hands at the same level and hip distance apart. There should be a slight tension in the exercise band or tube. 3. Slowly curl your hand up toward your forearm. 4. Hold this position for 3 seconds. 5. Slowly lower your hand back to the starting position. Repeat 2 times. Complete this exercise 3 times per week. Exercise J: Wrist Extensors  1. Sit with your left / right  forearm supported on a table and your hand resting palm-down over the edge of the table. Your elbow should be bent to an "L" shape (about 90 degrees) and be below the level of your shoulder. 2. Hold a 3-5 lb weight in your left / right hand. Or, hold a rubber exercise band or tube in both hands, keeping your hands at the same level and hip distance apart. There should be a slight tension in the exercise band or tube. 3. Slowly curl your hand up toward your forearm. 4. Hold this position for 3 seconds. 5. Slowly lower your hand back to the starting position. Repeat 2 times. Complete this exercise 3 times per week. Exercise K: Forearm Rotation, Supination  1. Sit with your left / right forearm supported on a table and your hand resting palm-down. Your elbow should be at your side, bent to an "L" shape (about 90 degrees), and below the level of your shoulder. Keep your wrist stable and in a neutral position throughout the exercise. 2. Gently hold a lightweight hammer with your left / right hand. 3. Without moving your elbow or wrist, slowly rotate your palm upward to a thumbs-up position.  4. Hold this position for 3 seconds. 5. Slowly return your forearm to the starting position. Repeat 2 times. Complete this exercise 3 times per week. Exercise L: Forearm Rotation, Pronation  1. Sit with your left / right forearm supported on a table and your hand resting palm-up. Your elbow should be at your side, bent to an "L" shape (about 90 degrees), and below the level of your shoulder. Keep your wrist stable. Do not allow it to move backward or forward during the exercise. 2. Gently hold a lightweight hammer with your left / right hand. 3. Without moving your elbow or wrist, slowly rotate your palm and hand upward to a thumbs-up position. 4. Hold this position for 3 seconds. 5. Slowly return your forearm to the starting position. Repeat 2 times. Complete this exercise 3 times per week. Exercise M: Grip Strengthening  1. Hold one of these items in your left / right hand: play dough, therapy putty, a dense sponge, a stress ball, or a large, rolled sock. 2. Squeeze as hard as you can without increasing pain. 3. Hold this position for 5 seconds. 4. Slowly release your grip. Repeat 2 times. Complete this exercise 3 times per week.  This information is not intended to replace advice given to you by your health care provider. Make sure you discuss any questions you have with your health care provider. Document Released: 06/24/2005 Document Revised: 05/04/2016 Document Reviewed: 05/05/2015 Elsevier Interactive Patient Education  2018 [employer].     ED Prescriptions    Medication Sig Dispense Auth. Provider   predniSONE (DELTASONE) 20 MG tablet Take 2 tablets (40 mg total) by mouth daily with breakfast for 5 days. 10 tablet Shelda Pal, DO       Riki Sheer [place], [place] 01/07/19 1102

## 2019-01-17 DIAGNOSIS — L501 Idiopathic urticaria: Secondary | ICD-10-CM | POA: Diagnosis not present

## 2019-01-24 ENCOUNTER — Ambulatory Visit (INDEPENDENT_AMBULATORY_CARE_PROVIDER_SITE_OTHER): Payer: 59 | Admitting: *Deleted

## 2019-01-24 ENCOUNTER — Other Ambulatory Visit: Payer: Self-pay

## 2019-01-24 DIAGNOSIS — L509 Urticaria, unspecified: Secondary | ICD-10-CM

## 2019-01-24 DIAGNOSIS — L501 Idiopathic urticaria: Secondary | ICD-10-CM

## 2019-01-30 ENCOUNTER — Telehealth: Payer: Self-pay | Admitting: *Deleted

## 2019-01-30 NOTE — Telephone Encounter (Signed)

## 2019-01-31 ENCOUNTER — Ambulatory Visit
Admission: RE | Admit: 2019-01-31 | Discharge: 2019-01-31 | Disposition: A | Discharge status: Home / Self Care | Payer: Self-pay | Source: Ambulatory Visit | Attending: Internal Medicine | Admitting: Internal Medicine

## 2019-01-31 ENCOUNTER — Other Ambulatory Visit: Payer: Self-pay

## 2019-01-31 DIAGNOSIS — E785 Hyperlipidemia, unspecified: Secondary | ICD-10-CM

## 2019-02-21 ENCOUNTER — Ambulatory Visit (INDEPENDENT_AMBULATORY_CARE_PROVIDER_SITE_OTHER): Payer: 59 | Admitting: *Deleted

## 2019-02-21 DIAGNOSIS — L501 Idiopathic urticaria: Secondary | ICD-10-CM | POA: Diagnosis not present

## 2019-03-21 ENCOUNTER — Ambulatory Visit: Payer: Self-pay

## 2019-03-21 ENCOUNTER — Ambulatory Visit: Payer: 59

## 2019-03-28 ENCOUNTER — Encounter: Payer: Self-pay | Admitting: Allergy and Immunology

## 2019-03-28 ENCOUNTER — Ambulatory Visit: Payer: 59 | Admitting: Allergy and Immunology

## 2019-03-28 ENCOUNTER — Other Ambulatory Visit: Payer: Self-pay

## 2019-03-28 ENCOUNTER — Ambulatory Visit: Payer: Self-pay

## 2019-03-28 VITALS — BP 160/70 | HR 91 | Temp 97.7°F | Resp 20

## 2019-03-28 DIAGNOSIS — L299 Pruritus, unspecified: Secondary | ICD-10-CM

## 2019-03-28 DIAGNOSIS — L501 Idiopathic urticaria: Secondary | ICD-10-CM | POA: Diagnosis not present

## 2019-03-28 DIAGNOSIS — L509 Urticaria, unspecified: Secondary | ICD-10-CM

## 2019-03-28 MED ORDER — EPINEPHRINE 0.3 MG/0.3ML IJ SOAJ: INTRAMUSCULAR | 1 refills | Status: DC

## 2019-03-28 NOTE — Patient Instructions (Addendum)
  1. Every day use levocetirizine 5 mg twice a day  2. Can add OTC Benadryl and 'cream'  if needed  3. Continue Xolair injections 300 mg every month (and Epi-Pen)  4. Return to clinic in 1 year or earlier if problem  5.  Obtain fall flu vaccine (and COVID vaccine)

## 2019-03-28 NOTE — Progress Notes (Signed)
 [place] [facility] [place] [facility] [place] - [place] [facility] [place]   Follow-up Note  Referring Provider: Plotnikov, Aleksei V, MD Primary Provider: Plotnikov, Aleksei V, MD Date of Office Visit: 03/28/2019  Subjective:   Renee Mann (DOB: 01/29/1956) is a 63 y.o. female who [facility] in re-evaluation of the following:  HPI: Syndey returns to this clinic in reevaluation of her urticaria.  I have not seen her in this clinic since 2018 but she did visit with Dr. Bobbitt on 29 March 2018 in this clinic.  She continues to use omalizumab injections to control her urticaria.  She has not had any urticaria at all while utilizing this form of treatment.  She still is a little bit itchy in the center of her back and on her posterior arms.  She has been using a "cream" prescribed by her primary care doctor at least 1 time per day to these areas.  She continues on an antihistamine although she is not really sure about the name of that antihistamine.    Allergies as of 03/28/2019      Reactions   Codeine Hives   Atenolol    REACTION: WEIRD FEELINGS   Hydrochlorothiazide W-triamterene    REACTION: H/A      Medication List    Accu-Chek Aviva Plus w/Device Kit Use to check blood sugar daily. DX E11.9   amLODipine 5 MG tablet Commonly known as: NORVASC Take 1 tablet (5 mg total) by mouth daily.   cholecalciferol 1000 units tablet Commonly known as: VITAMIN D Take 1 tablet (1,000 Units total) by mouth daily.   clotrimazole-betamethasone cream Commonly known as: Lotrisone Apply 1 application topically 2 (two) times daily. X 1 wk only   EPINEPHrine 0.3 mg/0.3 mL Soaj injection Commonly known as: EpiPen 2-Pak Use as directed for severe allergic reaction   EQ Allergy Relief 10 MG tablet Generic drug: loratadine Take 1 tablet by mouth once daily   glucose blood test strip Use 1 times a day PRN. DX: E11.9   glucose blood test strip Commonly known  as: ONE TOUCH ULTRA TEST 1 each by Other route daily. Use to check blood sugars once a day   glucose blood test strip Commonly known as: Accu-Chek Aviva Plus Use to check blood sugar daily. DX E11.9   metFORMIN 750 MG 24 hr tablet Commonly known as: Glucophage XR Take 1 tablet (750 mg total) by mouth daily with breakfast.   OneTouch Delica Lancets Fine Misc USE 1  TO CHECK GLUCOSE ONCE DAILY PRN   accu-chek multiclix lancets Use to check blood sugar daily. DX E11.9   triamcinolone 0.1 % cream : eucerin Crea Apply 1 application topically 2 (two) times daily.   Xolair 150 MG injection Generic drug: omalizumab INJECT 300MG SUBCUTANEOUSLY EVERY 28 DAYS (GIVEN AT [facility]  OFFICE)       Past Medical History:  Diagnosis Date  . Diabetes mellitus 2008   type II  . Gastroesophageal reflux disease   . Hives   . Hypertension   . Osteoarthritis   . 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 ABDOMINAL HYSTERECTOMY  1987  . burritis surgery Left 1998   hip    Review of systems negative except as noted in HPI / PMHx or noted below:  Review of Systems  Constitutional: Negative.   HENT: Negative.   Eyes: Negative.     Respiratory: Negative.   Cardiovascular: Negative.   Gastrointestinal: Negative.   Genitourinary: Negative.   Musculoskeletal: Negative.   Skin: Negative.   Neurological: Negative.   Endo/Heme/Allergies: Negative.   Psychiatric/Behavioral: Negative.      Objective:   Vitals:   03/28/19 1551  BP: (!) 160/70  Pulse: 91  Resp: 20  Temp: 97.7 F (36.5 C)  SpO2: 99%          Physical Exam Constitutional:      Appearance: She is not diaphoretic.  HENT:     Head: Normocephalic.     Right Ear: Tympanic membrane, ear canal and external ear normal.     Left Ear: Tympanic membrane, ear canal and external ear normal.     Nose: Nose normal. No mucosal edema or rhinorrhea.     Mouth/Throat:     Pharynx:  Uvula midline. No oropharyngeal exudate.  Eyes:     Conjunctiva/sclera: Conjunctivae normal.  Neck:     Thyroid: No thyromegaly.     Trachea: Trachea normal. No tracheal tenderness or tracheal deviation.  Cardiovascular:     Rate and Rhythm: Normal rate and regular rhythm.     Heart sounds: Normal heart sounds, S1 normal and S2 normal. No murmur.  Pulmonary:     Effort: No respiratory distress.     Breath sounds: Normal breath sounds. No stridor. No wheezing or rales.  Lymphadenopathy:     Head:     Right side of head: No tonsillar adenopathy.     Left side of head: No tonsillar adenopathy.     Cervical: No cervical adenopathy.  Skin:    Findings: No erythema or rash.     Nails: There is no clubbing.   Neurological:     Mental Status: She is alert.     Diagnostics:   Assessment and Plan:   1. Urticaria   2. Pruritic disorder     1. Every day use levocetirizine 5 mg twice a day  2. Can add OTC Benadryl and 'cream'  if needed  3. Continue Xolair injections 300 mg every month (and Epi-Pen)  4. Return to clinic in 1 year or earlier if problem  5.  Obtain fall flu vaccine (and COVID vaccine)  [name] has had a pretty good response to the administration of omalizumab.  She still little bit itchy on occasion and I recommended that she try levo cetirizine at 5 or 10 mg daily.  Assuming she does well with this plan I will see her back in this clinic in 1 year or earlier if there is a problem.  [name], MD Allergy / Immunology [place]

## 2019-03-29 ENCOUNTER — Encounter: Payer: Self-pay | Admitting: Allergy and Immunology

## 2019-03-29 ENCOUNTER — Other Ambulatory Visit: Payer: Self-pay | Admitting: *Deleted

## 2019-03-29 MED ORDER — LEVOCETIRIZINE DIHYDROCHLORIDE 5 MG PO TABS: ORAL_TABLET | ORAL | 11 refills | Status: DC

## 2019-03-29 NOTE — Telephone Encounter (Signed)
Patient called in levocetirizine not sent in form yesterday. Sent in rx

## 2019-04-11 ENCOUNTER — Other Ambulatory Visit (INDEPENDENT_AMBULATORY_CARE_PROVIDER_SITE_OTHER): Payer: 59

## 2019-04-11 ENCOUNTER — Ambulatory Visit (INDEPENDENT_AMBULATORY_CARE_PROVIDER_SITE_OTHER)
Admission: RE | Admit: 2019-04-11 | Discharge: 2019-04-11 | Disposition: A | Discharge status: Home / Self Care | Payer: 59 | Source: Ambulatory Visit | Attending: Internal Medicine | Admitting: Internal Medicine

## 2019-04-11 ENCOUNTER — Other Ambulatory Visit: Payer: Self-pay

## 2019-04-11 ENCOUNTER — Encounter: Payer: Self-pay | Admitting: Internal Medicine

## 2019-04-11 ENCOUNTER — Ambulatory Visit (INDEPENDENT_AMBULATORY_CARE_PROVIDER_SITE_OTHER): Payer: 59 | Admitting: Internal Medicine

## 2019-04-11 DIAGNOSIS — M25552 Pain in left hip: Secondary | ICD-10-CM

## 2019-04-11 DIAGNOSIS — M25551 Pain in right hip: Secondary | ICD-10-CM

## 2019-04-11 DIAGNOSIS — E119 Type 2 diabetes mellitus without complications: Secondary | ICD-10-CM

## 2019-04-11 DIAGNOSIS — M5430 Sciatica, unspecified side: Secondary | ICD-10-CM | POA: Insufficient documentation

## 2019-04-11 DIAGNOSIS — I10 Essential (primary) hypertension: Secondary | ICD-10-CM | POA: Diagnosis not present

## 2019-04-11 DIAGNOSIS — L509 Urticaria, unspecified: Secondary | ICD-10-CM

## 2019-04-11 DIAGNOSIS — M5431 Sciatica, right side: Secondary | ICD-10-CM

## 2019-04-11 LAB — BASIC METABOLIC PANEL
BUN: 11 mg/dL (ref 6–23)
CO2: 27 mEq/L (ref 19–32)
Calcium: 9.9 mg/dL (ref 8.4–10.5)
Chloride: 102 mEq/L (ref 96–112)
Creatinine, Ser: 0.74 mg/dL (ref 0.40–1.20)
GFR: 95.41 mL/min (ref >60.00)
Glucose, Bld: 112 mg/dL — ABNORMAL HIGH (ref 70–99)
Potassium: 3.9 mEq/L (ref 3.5–5.1)
Sodium: 139 mEq/L (ref 135–145)

## 2019-04-11 LAB — HEMOGLOBIN A1C: Hgb A1c MFr Bld: 7.6 % — ABNORMAL HIGH (ref 4.6–6.5)

## 2019-04-11 MED ORDER — CLOTRIMAZOLE-BETAMETHASONE 1-0.05 % EX CREA: 1.0000 "application " | TOPICAL_CREAM | Freq: Two times a day (BID) | CUTANEOUS | 0 refills | Status: DC

## 2019-04-11 MED ORDER — TRIAMCINOLONE 0.1 % CREAM:EUCERIN CREAM 1:1: 1.0000 "application " | TOPICAL_CREAM | Freq: Two times a day (BID) | CUTANEOUS | 2 refills | Status: DC

## 2019-04-11 MED ORDER — METHYLPREDNISOLONE 4 MG PO TBPK: ORAL_TABLET | ORAL | 0 refills | Status: DC

## 2019-04-11 MED ORDER — TIZANIDINE HCL 4 MG PO TABS: 4.0000 mg | ORAL_TABLET | Freq: Three times a day (TID) | ORAL | 1 refills | Status: DC | PRN

## 2019-04-11 NOTE — Assessment & Plan Note (Signed)
BP Readings from Last 3 Encounters:  04/11/19 (!) 150/90  03/28/19 (!) 160/70  01/07/19 (!) 172/95

## 2019-04-11 NOTE — Patient Instructions (Signed)
Piriformis Syndrome Rehab Ask your health care provider which exercises are safe for you. Do exercises exactly as told by your health care provider and adjust them as directed. It is normal to feel mild stretching, pulling, tightness, or discomfort as you do these exercises. Stop right away if you feel sudden pain or your pain gets worse. Do not begin these exercises until told by your health care provider. Stretching and range-of-motion exercises These exercises warm up your muscles and joints and improve the movement and flexibility of your hip and pelvis. The exercises also help to relieve pain, numbness, and tingling. Hip rotation This is an exercise in which you lie on your back and stretch the muscles that rotate your hip (hip rotators) to stretch your buttocks. Lie on your back on a firm surface. Pull your left / right knee toward your same shoulder with your left / right hand until your knee is pointing toward the ceiling. Hold your left / right ankle with your other hand. Keeping your knee steady, gently pull your left / right ankle toward your other shoulder until you feel a stretch in your buttocks. Hold this position for __________ seconds. Repeat __________ times. Complete this exercise __________ times a day. Hip extensor This is an exercise in which you lie on your back and pull your knee to your chest. Lie on your back on a firm surface. Both of your legs should be straight. Pull your left / right knee to your chest. Hold your leg in this position by holding onto the back of your thigh or the front of your knee. Hold this position for __________ seconds. Slowly return to the starting position. Repeat __________ times. Complete this exercise __________ times a day. Strengthening exercises These exercises build strength and endurance in your hip and thigh muscles. Endurance is the ability to use your muscles for a long time, even after they get tired. Straight leg raises, side-lying  This exercise strengthens the muscles that rotate the leg at the hip and move it away from your body (hip abductors). Lie on your side with your left / right leg in the top position. Lie so your head, shoulder, knee, and hip line up. Bend your bottom knee to help you balance. Lift your top leg 4-6 inches (10-15 cm) while keeping your toes pointed straight ahead. Hold this position for __________ seconds. Slowly lower your leg to the starting position. Let your muscles relax completely after each repetition. Repeat __________ times. Complete this exercise __________ times a day. Hip abduction and rotation This is sometimes called quadruped (on hands and knees) exercises. Get on your hands and knees on a firm, lightly padded surface. Your hands should be directly below your shoulders, and your knees should be directly below your hips. Lift your left / right knee out to the side. Keep your knee bent. Do not twist your body. Hold this position for __________ seconds. Slowly lower your leg. Repeat __________ times. Complete this exercise __________ times a day. Straight leg raises, face-down This exercise stretches the muscles that move your hips away from the front of the pelvis (hip extensors). Lie on your abdomen on a bed or a firm surface with a pillow under your hips. Squeeze your buttocks muscles and lift your left / right leg about 4-6 inches (10-15 cm) off the bed. Do not let your back arch. Hold this position for __________ seconds. Slowly lower your leg to the starting position. Let your muscles relax completely after each  repetition. Repeat __________ times. Complete this exercise __________ times a day. This information is not intended to replace advice given to you by your health care provider. Make sure you discuss any questions you have with your health care provider. Document Released: 08/10/2005 Document Revised: 12/01/2018 Document Reviewed: 06/02/2018 Elsevier Patient Education   2020 [employer]. Piriformis Syndrome  Piriformis syndrome is a condition that can cause pain and numbness in your buttocks and down the back of your leg. Piriformis syndrome happens when the small muscle that connects the base of your spine to your hip (piriformis muscle) presses on the nerve that runs down the back of your leg (sciatic nerve). The piriformis muscle helps your hip rotate and helps to bring your leg back and out. It also helps shift your weight to keep you stable while you are walking. The sciatic nerve runs under or through the piriformis muscle. Damage to the piriformis muscle can cause spasms that put pressure on the nerve below. This causes pain and discomfort while sitting and moving. The pain may feel as if it begins in the buttock and spreads (radiates) down your hip and thigh. What are the causes? This condition is caused by pressure on the sciatic nerve from the piriformis muscle. The piriformis muscle can get irritated with overuse, especially if other hip muscles are weak and the piriformis muscle has to do extra work. Piriformis syndrome can also occur after an injury, like a fall onto your buttocks. What increases the risk? You are more likely to develop this condition if you:  Are a woman.  Sit for long periods of time.  Are a cyclist.  Have weak buttocks muscles (gluteal muscles). What are the signs or symptoms? Symptoms of this condition include:  Pain, tingling, or numbness that starts in the buttock and runs down the back of your leg (sciatica).  Pain in the groin or thigh area. Your symptoms may get worse:  The longer you sit.  When you walk, run, or climb stairs.  When straining to have a bowel movement. How is this diagnosed? This condition is diagnosed based on your symptoms, medical history, and physical exam.  During the exam, your health care provider may: ? Move your leg into different positions to check for pain. ? Press on the  muscles of your hip and buttock to see if that increases your symptoms.  You may also have tests, including: ? Imaging tests such as X-rays, MRI, or ultrasound. ? Electromyogram (EMG). This test measures electrical signals sent by your nerves into the muscles. ? Nerve conduction study. This test measures how well electrical signals pass through your nerves. How is this treated? This condition may be treated by:  Stopping all activities that cause pain or make your condition worse.  Applying ice or using heat therapy.  Taking medicines to reduce pain and swelling.  Taking a muscle relaxer (muscle relaxant) to stop muscle spasms.  Doing range-of-motion and strengthening exercises (physical therapy) as told by your health care provider.  Massaging the area.  Having acupuncture.  Getting an injection of medicine in the piriformis muscle. Your health care provider will choose the medicine based on your condition. He or she may inject: ? An anti-inflammatory medicine (steroid) to reduce swelling. ? A numbing medicine (local anesthetic) to block the pain. ? Botulinum toxin. The toxin blocks nerve impulses to specific muscles to reduce muscle tension. In rare cases, you may need surgery to cut the muscle and release pressure on the  nerve if other treatments do not work. Follow these instructions at home: Activity  Do not sit for long periods. Get up and walk around every 20 minutes or as often as told by your health care provider. ? When driving long distances, make sure to take frequent stops to get up and stretch.  Use a cushion when you sit on hard surfaces.  Do exercises as told by your health care provider.  Return to your normal activities as told by your health care provider. Ask your health care provider what activities are safe for you. Managing pain, stiffness, and swelling      If directed, apply heat to the affected area as often as told by your health care provider.  Use the heat source that your health care provider recommends, such as a moist heat pack or a heating pad. ? Place a towel between your skin and the heat source. ? Leave the heat on for 20-30 minutes. ? Remove the heat if your skin turns bright red. This is especially important if you are unable to feel pain, heat, or cold. You may have a greater risk of getting burned.  If directed, put ice on the injured area. ? Put ice in a plastic bag. ? Place a towel between your skin and the bag. ? Leave the ice on for 20 minutes, 2-3 times a day. General instructions  Take over-the-counter and prescription medicines only as told by your health care provider.  Ask your health care provider if the medicine prescribed to you requires you to avoid driving or using heavy machinery.  You may need to take actions to prevent or treat constipation, such as: ? Drink enough fluid to keep your urine pale yellow. ? Take over-the-counter or prescription medicines. ? Eat foods that are high in fiber, such as beans, whole grains, and fresh fruits and vegetables. ? Limit foods that are high in fat and processed sugars, such as fried or sweet foods.  Keep all follow-up visits as told by your health care provider. This is important. How is this prevented?  Do not sit for longer than 20 minutes at a time. When you sit, choose padded surfaces.  Warm up and stretch before being active.  Cool down and stretch after being active.  Give your body time to rest between periods of activity.  Make sure to use equipment that fits you.  Maintain physical fitness, including: ? Strength. ? Flexibility. Contact a health care provider if:  Your pain and stiffness continue or get worse.  Your leg or hip becomes weak.  You have changes in your bowel function or bladder function. Summary  Piriformis syndrome is a condition that can cause pain, tingling, and numbness in your buttocks and down the back of your leg.   You may try applying heat or ice to relieve the pain.  Do not sit for long periods. Get up and walk around every 20 minutes or as often as told by your health care provider. This information is not intended to replace advice given to you by your health care provider. Make sure you discuss any questions you have with your health care provider. Document Released: 08/10/2005 Document Revised: 12/01/2018 Document Reviewed: 04/06/2018 Elsevier Patient Education  2020 [employer].

## 2019-04-11 NOTE — Assessment & Plan Note (Signed)
LS x ray Sports med ref Zanaflex Back stretching Medrol pack

## 2019-04-11 NOTE — Assessment & Plan Note (Signed)
Coronary calcium score of 0  --- 2020

## 2019-04-11 NOTE — Assessment & Plan Note (Signed)
R Piriformis syndrome vs lumbar radiculopathy LS x ray Sports med ref Zanaflex Back stretching Medrol pack

## 2019-04-11 NOTE — Assessment & Plan Note (Signed)
better 

## 2019-04-11 NOTE — Progress Notes (Signed)
Subjective:  Patient ID: Renee Mann, female    DOB: 04/09/1956  Age: 63 y.o. MRN: [id number]  CC: No chief complaint on file.   HPI Renee Mann presents for R hip pain, LBP irrad to RLE, knee gieves out x 2 mo. C/o L hip pain at night. No falls. Pain is 9/10 w/walking F/u DM, HTN, hives  Outpatient Medications Prior to Visit  Medication Sig Dispense Refill  . amLODipine (NORVASC) 5 MG tablet Take 1 tablet (5 mg total) by mouth daily. 30 tablet 11  . Blood Glucose Monitoring Suppl (ACCU-CHEK AVIVA PLUS) w/Device KIT Use to check blood sugar daily. DX E11.9 1 kit 0  . cholecalciferol (VITAMIN D) 1000 units tablet Take 1 tablet (1,000 Units total) by mouth daily. 30 tablet 11  . clotrimazole-betamethasone (LOTRISONE) cream Apply 1 application topically 2 (two) times daily. X 1 wk only 30 g 0  . EPINEPHrine (EPIPEN 2-PAK) 0.3 mg/0.3 mL IJ SOAJ injection Use as directed for severe allergic reaction 1 each 1  . EQ ALLERGY RELIEF 10 MG tablet Take 1 tablet by mouth once daily 100 tablet 3  . glucose blood (ACCU-CHEK AVIVA PLUS) test strip Use to check blood sugar daily. DX E11.9 100 each 12  . glucose blood (ONE TOUCH ULTRA TEST) test strip 1 each by Other route daily. Use to check blood sugars once a day 100 each 12  . glucose blood test strip Use 1 times a day PRN. DX: E11.9 100 each 3  . Lancets (ACCU-CHEK MULTICLIX) lancets Use to check blood sugar daily. DX E11.9 100 each 12  . levocetirizine (XYZAL) 5 MG tablet Take 1 tablet twice daily 60 tablet 11  . metFORMIN (GLUCOPHAGE XR) 750 MG 24 hr tablet Take 1 tablet (750 mg total) by mouth daily with breakfast. 60 tablet 11  . ONETOUCH DELICA LANCETS FINE MISC USE 1  TO CHECK GLUCOSE ONCE DAILY PRN 100 each 3  . Triamcinolone Acetonide (TRIAMCINOLONE 0.1 % CREAM : EUCERIN) CREA Apply 1 application topically 2 (two) times daily. 1 each 2  . XOLAIR 150 MG injection INJECT 300MG SUBCUTANEOUSLY EVERY 28 DAYS (GIVEN AT MD  OFFICE) 2 each 11    Facility-Administered Medications Prior to Visit  Medication Dose Route Frequency Provider Last Rate Last Dose  . omalizumab Arvid Right) injection 300 mg  300 mg Subcutaneous Q28 days [name], MD   300 mg at 03/28/19 1629    ROS: Review of Systems  Constitutional: Negative for activity change, appetite change, chills, fatigue and unexpected weight change.  HENT: Negative for congestion, mouth sores and sinus pressure.   Eyes: Negative for visual disturbance.  Respiratory: Negative for cough and chest tightness.   Gastrointestinal: Negative for abdominal pain and nausea.  Genitourinary: Negative for difficulty urinating, frequency and vaginal pain.  Musculoskeletal: Positive for arthralgias, back pain and gait problem.  Skin: Negative for pallor and rash.  Neurological: Negative for dizziness, tremors, weakness, numbness and headaches.  Psychiatric/Behavioral: Negative for confusion, sleep disturbance and suicidal ideas.    Objective:  BP (!) 150/90 (BP Location: Left Arm, Patient Position: Sitting, Cuff Size: Normal)   Pulse 96   Temp 98.2 F (36.8 C) (Oral)   Ht 4' 10.5" (1.486 m)   Wt 153 lb (69.4 kg)   SpO2 97%   BMI 31.43 kg/m   BP Readings from Last 3 Encounters:  04/11/19 (!) 150/90  03/28/19 (!) 160/70  01/07/19 (!) 172/95    Wt Readings from Last  3 Encounters:  04/11/19 153 lb (69.4 kg)  10/10/18 155 lb (70.3 kg)  04/11/18 153 lb (69.4 kg)    Physical Exam Constitutional:      General: She is not in acute distress.    Appearance: She is well-developed.  HENT:     Head: Normocephalic.     Right Ear: External ear normal.     Left Ear: External ear normal.     Nose: Nose normal.  Eyes:     General:        Right eye: No discharge.        Left eye: No discharge.     Conjunctiva/sclera: Conjunctivae normal.     Pupils: Pupils are equal, round, and reactive to light.  Neck:     Musculoskeletal: Normal range of motion and neck supple.     Thyroid: No  thyromegaly.     Vascular: No JVD.     Trachea: No tracheal deviation.  Cardiovascular:     Rate and Rhythm: Normal rate and regular rhythm.     Heart sounds: Normal heart sounds.  Pulmonary:     Effort: No respiratory distress.     Breath sounds: No stridor. No wheezing.  Abdominal:     General: Bowel sounds are normal. There is no distension.     Palpations: Abdomen is soft. There is no mass.     Tenderness: There is no abdominal tenderness. There is no guarding or rebound.  Musculoskeletal:        General: No tenderness.  Lymphadenopathy:     Cervical: No cervical adenopathy.  Skin:    Findings: No erythema or rash.  Neurological:     Mental Status: She is oriented to person, place, and time.     Cranial Nerves: No cranial nerve deficit.     Motor: No abnormal muscle tone.     Coordination: Coordination normal.     Deep Tendon Reflexes: Reflexes normal.  Psychiatric:        Behavior: Behavior normal.        Thought Content: Thought content normal.        Judgment: Judgment normal.   B hips - painful lat LS stiff B buttocks - painful Str leg elev (-) B  Lab Results  Component Value Date   WBC 8.0 10/10/2018   HGB 13.4 10/10/2018   HCT 41.3 10/10/2018   PLT 466.0 (H) 10/10/2018   GLUCOSE 108 (H) 10/10/2018   CHOL 165 10/10/2018   TRIG 71.0 10/10/2018   HDL 36.40 (L) 10/10/2018   LDLCALC 115 (H) 10/10/2018   ALT 24 10/10/2018   AST 17 10/10/2018   NA 140 10/10/2018   K 4.1 10/10/2018   CL 100 10/10/2018   CREATININE 0.73 10/10/2018   BUN 8 10/10/2018   CO2 29 10/10/2018   TSH 2.20 10/10/2018   HGBA1C 7.4 (H) 10/10/2018   MICROALBUR <0.7 10/10/2018    Ct Cardiac Scoring  Addendum Date: 01/31/2019   ADDENDUM REPORT: 01/31/2019 09:43 CLINICAL DATA:  Risk stratification EXAM: Coronary Calcium Score TECHNIQUE: The patient was scanned on a Siemens Somatom 64 slice scanner. Axial non-contrast 3 mm slices were carried out through the heart. The data set was  analyzed on a dedicated work station and scored using the [employer]. FINDINGS: Non-cardiac: See separate report from [facility]. Ascending aorta: Normal diameter 3.1 cm Pericardium: Normal Coronary arteries: No calcium noted IMPRESSION: Coronary calcium score of 0. [place] Electronically Signed   By: Eligha Bridegroom.D.  On: 01/31/2019 09:43   Result Date: 01/31/2019 EXAM: OVER-READ INTERPRETATION  CT CHEST The following report is an over-read performed by radiologist Dr. Rolm Baptise of [facility], [place] on 01/31/2019. This over-read does not include interpretation of cardiac or coronary anatomy or pathology. The coronary calcium score interpretation by the cardiologist is attached. COMPARISON:  None. FINDINGS: Vascular: Heart is normal size.  Visualized aorta normal caliber. Mediastinum/Nodes: No adenopathy in the lower mediastinum or hila. Lungs/Pleura: Visualized lungs clear.  No effusions. Upper Abdomen: Imaging into the upper abdomen shows no acute findings. Musculoskeletal: Chest wall soft tissues are unremarkable. No acute bony abnormality. IMPRESSION: No acute or significant extracardiac abnormality. Electronically Signed: By: Rolm Baptise M.D. On: 01/31/2019 08:49    Assessment & Plan:   There are no diagnoses linked to this encounter.   No orders of the defined types were placed in this encounter.    Follow-up: No follow-ups on file.  Walker Kehr, MD

## 2019-04-12 ENCOUNTER — Other Ambulatory Visit: Payer: Self-pay | Admitting: *Deleted

## 2019-04-12 ENCOUNTER — Telehealth: Payer: Self-pay | Admitting: Internal Medicine

## 2019-04-12 MED ORDER — XOLAIR 150 MG ~~LOC~~ SOLR: SUBCUTANEOUS | 11 refills | Status: DC

## 2019-04-12 NOTE — Telephone Encounter (Signed)
Copied from [place] [id number]. Topic: General - Other >> Apr 12, 2019  5:10 PM Keene Breath wrote: Reason for CRM: Pharmacy called to get clarification on prescription for Triamcinolone Acetonide (TRIAMCINOLONE 0.1 % CREAM : EUCERIN) CREA.  They also need to know the area that is being applied.  Please call to verify at [phone]

## 2019-04-13 NOTE — Telephone Encounter (Signed)
Pharmacy informed of below. They need the ratio of triamcinolone to Eucerin.

## 2019-04-13 NOTE — Telephone Encounter (Signed)
Pruritic areas on trunk, extremities prn Thx

## 2019-04-15 NOTE — Telephone Encounter (Signed)
1:4 added Thx

## 2019-04-17 MED ORDER — TRIAMCINOLONE 0.1 % CREAM:EUCERIN CREAM 1:1: 1.0000 "application " | TOPICAL_CREAM | Freq: Two times a day (BID) | CUTANEOUS | 2 refills | Status: DC

## 2019-04-17 NOTE — Telephone Encounter (Signed)
Notified pharmacy w/MD response spoke w/Drai. She also verified the quanity. She states she will change it to 480 mg to make it even. Updated med list../lmb

## 2019-04-25 ENCOUNTER — Other Ambulatory Visit: Payer: Self-pay

## 2019-04-25 ENCOUNTER — Ambulatory Visit (INDEPENDENT_AMBULATORY_CARE_PROVIDER_SITE_OTHER): Payer: 59 | Admitting: *Deleted

## 2019-04-25 DIAGNOSIS — L501 Idiopathic urticaria: Secondary | ICD-10-CM | POA: Diagnosis not present

## 2019-04-25 DIAGNOSIS — L509 Urticaria, unspecified: Secondary | ICD-10-CM

## 2019-04-28 ENCOUNTER — Other Ambulatory Visit: Payer: Self-pay | Admitting: Internal Medicine

## 2019-05-07 NOTE — Progress Notes (Signed)
[facility] [place],  [address] Phone: [phone] Subjective:   I Renee Mann am serving as a Education administrator for Dr. Hulan Saas.  I'm seeing this patient by the request  of:  Plotnikov, [name], MD  CC:   [id number]  Madicyn A Mann is a 63 y.o. female coming in with complaint of bilateral hip pain. Posterior hip pain that is worse on the right side. Pain radiates down her legs. Pain keeps her up at night. Numbness and tingling in toes.   Onset- Chronic Location - bilateral posterior hips  Character- sharp, dull, achy sore  Aggravating factors- Sleeping, walking Reliving factors-  Therapies tried- ice, heat, topical, oral  Severity- 9/10      Past Medical History:  Diagnosis Date  . Diabetes mellitus 2008   type II  . Gastroesophageal reflux disease   . Hives   . Hypertension   . Osteoarthritis   . 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 ABDOMINAL HYSTERECTOMY  1987  . burritis surgery Left 1998   hip   Social History   Socioeconomic History  . Marital status: Married    Spouse name: Not on file  . Number of children: 1  . Years of education: Not on file  . Highest education level: Not on file  Occupational History  . 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 Financial resource strain: Not on file  . Food insecurity    Worry: Not on file    Inability: Not on file  . Transportation needs    Medical: Not on file    Non-medical: Not on file  Tobacco Use  . Smoking status: Former Smoker    Quit date: 08/24/1998    Years since quitting: 20.7  . Smokeless tobacco: Never Used  Substance and Sexual Activity  . Alcohol use: No    Alcohol/week: 0.0 standard drinks  . Drug use: No  . Sexual activity: Yes  Lifestyle  . Physical activity    Days per week: Not on file    Minutes per session: Not on file  . Stress: Not on file   Relationships  . Social Herbalist on phone: Not on file    Gets together: Not on file    Attends religious service: Not on file    Active member of club or organization: Not on file    Attends meetings of clubs or organizations: Not on file    Relationship status: Not on file  Other Topics Concern  . Not on file  Social History Narrative   ** Merged History Encounter **       Allergies  Allergen Reactions  . Codeine Hives  . Atenolol     REACTION: WEIRD FEELINGS  . Hydrochlorothiazide W-Triamterene     REACTION: H/A   Family History  Problem Relation Age of Onset  . Hypertension Mother   . Cancer Mother   . Stroke Mother   . Hypertension Sister   . Hypertension Other   . Colon cancer Neg Hx   . Esophageal cancer Neg Hx   . Stomach cancer Neg Hx   . Rectal cancer Neg Hx     Current Outpatient Medications (Endocrine & Metabolic):  .  metFORMIN (GLUCOPHAGE XR) 750 MG 24 hr tablet, Take 1 tablet (750 mg total) by  mouth daily with breakfast. .  methylPREDNISolone (MEDROL DOSEPAK) 4 MG TBPK tablet, As directed   Current Outpatient Medications (Cardiovascular):  .  amLODipine (NORVASC) 5 MG tablet, Take 1 tablet by mouth once daily .  EPINEPHrine (EPIPEN 2-PAK) 0.3 mg/0.3 mL IJ SOAJ injection, Use as directed for severe allergic reaction   Current Outpatient Medications (Respiratory):  Noelle Penner ALLERGY RELIEF 10 MG tablet, Take 1 tablet by mouth once daily .  levocetirizine (XYZAL) 5 MG tablet, Take 1 tablet twice daily .  omalizumab (XOLAIR) 150 MG injection, INJECT 300MG SUBCUTANEOUSLY EVERY 28 DAYS (GIVEN AT MD  OFFICE)  Current Facility-Administered Medications (Respiratory):  .  omalizumab Arvid Right) injection 300 mg  Current Outpatient Medications (Analgesics):  .  meloxicam (MOBIC) 7.5 MG tablet, Take 1 tablet (7.5 mg total) by mouth daily.     Current Outpatient Medications (Other):  .  Blood Glucose Monitoring Suppl (ACCU-CHEK AVIVA PLUS) w/Device  KIT, Use to check blood sugar daily. DX E11.9 .  clotrimazole-betamethasone (LOTRISONE) cream, Apply 1 application topically 2 (two) times daily. X 1 wk only .  glucose blood (ACCU-CHEK AVIVA PLUS) test strip, Use to check blood sugar daily. DX E11.9 .  glucose blood (ONE TOUCH ULTRA TEST) test strip, 1 each by Other route daily. Use to check blood sugars once a day .  glucose blood test strip, Use 1 times a day PRN. DX: E11.9 .  Lancets (ACCU-CHEK MULTICLIX) lancets, Use to check blood sugar daily. DX E11.9 .  ONETOUCH DELICA LANCETS FINE MISC, USE 1  TO CHECK GLUCOSE ONCE DAILY PRN .  tiZANidine (ZANAFLEX) 4 MG tablet, Take 1 tablet (4 mg total) by mouth every 8 (eight) hours as needed for muscle spasms. .  Triamcinolone Acetonide (TRIAMCINOLONE 0.1 % CREAM : EUCERIN) CREA, Apply 1 application topically 2 (two) times daily. [name]  gabapentin (NEURONTIN) 100 MG capsule, Take 2 capsules (200 mg total) by mouth at bedtime.     Past medical history, social, surgical and family history all reviewed in electronic medical record.  No pertanent information unless stated regarding to the chief complaint.   Review of Systems:  No headache, visual changes, nausea, vomiting, diarrhea, constipation, dizziness, abdominal pain, skin rash, fevers, chills, night sweats, weight loss, swollen lymph nodes, body aches, joint swelling, muscle aches, chest pain, shortness of breath, mood changes.   Objective  Blood pressure (!) 144/86, pulse 97, height 4' 10"  (1.473 m), weight 154 lb (69.9 kg), SpO2 98 %.    General: No apparent distress alert and oriented x3 mood and affect normal, dressed appropriately.  HEENT: Pupils equal, extraocular movements intact  Respiratory: Patient's speak in full sentences and does not appear short of breath  Cardiovascular: No lower extremity edema, non tender, no erythema  Skin: Warm dry intact with no signs of infection or rash on extremities or on axial skeleton.  Abdomen: Soft  nontender  Neuro: Cranial nerves II through XII are intact, neurovascularly intact in all extremities with 2+ DTRs and 2+ pulses.  Lymph: No lymphadenopathy of posterior or anterior cervical chain or axillae bilaterally.  Gait normal with good balance and coordination.  MSK:   tender with full range of motion and good stability and symmetric strength and tone of shoulders, elbows, wrist, hip, knee and ankles bilaterally.  Back exam does have some loss of core strength.  Patient does have some tenderness to palpation diffusely in the paraspinal musculature right greater than left more around the lumbosacral area.  Positive straight leg  test on the right side at 40 degrees of forward flexion.  Deep tendon reflexes are intact.  The patient does have 5-5 strength.  Tightness with Corky Sox but no side pain.  [id number]; 15 additional minutes spent for Therapeutic exercises as stated in above notes.  This included exercises focusing on stretching, strengthening, with significant focus on eccentric aspects.   Long term goals include an improvement in range of motion, strength, endurance as well as avoiding reinjury. Patient's frequency would include in 1-2 times a day, 3-5 times a week for a duration of 6-12 weeks. Low back exercises that included:  Pelvic tilt/bracing instruction to focus on control of the pelvic girdle and lower abdominal muscles  Glute strengthening exercises, focusing on proper firing of the glutes without engaging the low back muscles Proper stretching techniques for maximum relief for the hamstrings, hip flexors, low back and some rotation where tolerated   Proper technique shown and discussed handout in great detail with ATC.  All questions were discussed and answered.     Impression and Recommendations:     This case required medical decision making of moderate complexity. The above documentation has been reviewed and is accurate and complete Lyndal Pulley, DO       Note: This  dictation was prepared with Dragon dictation along with smaller phrase technology. Any transcriptional errors that result from this process are unintentional.

## 2019-05-08 ENCOUNTER — Encounter: Payer: Self-pay | Admitting: Family Medicine

## 2019-05-08 ENCOUNTER — Ambulatory Visit (INDEPENDENT_AMBULATORY_CARE_PROVIDER_SITE_OTHER): Payer: 59 | Admitting: Family Medicine

## 2019-05-08 DIAGNOSIS — M5416 Radiculopathy, lumbar region: Secondary | ICD-10-CM | POA: Insufficient documentation

## 2019-05-08 MED ORDER — GABAPENTIN 100 MG PO CAPS: 200.0000 mg | ORAL_CAPSULE | Freq: Every day | ORAL | 3 refills | Status: DC

## 2019-05-08 MED ORDER — MELOXICAM 7.5 MG PO TABS: 7.5000 mg | ORAL_TABLET | Freq: Every day | ORAL | 0 refills | Status: DC

## 2019-05-08 NOTE — Assessment & Plan Note (Signed)
Right-sided lumbar radiculopathy.  Patient is concerned with her sugars moment did not want to do any type of steroid.  Meloxicam and gabapentin given today, discussed over-the-counter medicines, work with Product/process development scientist to learn home exercises in greater detail.  Follow-up with me again in 3 to 4 weeks.  Worsening symptoms will consider formal physical therapy.  If any weakness or decrease in deep tendon reflexes advanced imaging would be warranted.

## 2019-05-08 NOTE — Patient Instructions (Addendum)
Good to see you.  Ice 20 minutes 2 times daily. Usually after activity and before bed. Exercises 3 times a week.  Gabapentin 200 mg at night  meloxicam for 10 days Tart cherry extract 1200mg  at night See me again in 3-4 weeks

## 2019-05-23 ENCOUNTER — Other Ambulatory Visit: Payer: Self-pay

## 2019-05-23 ENCOUNTER — Ambulatory Visit (INDEPENDENT_AMBULATORY_CARE_PROVIDER_SITE_OTHER): Payer: 59

## 2019-05-23 DIAGNOSIS — L501 Idiopathic urticaria: Secondary | ICD-10-CM

## 2019-05-23 DIAGNOSIS — L509 Urticaria, unspecified: Secondary | ICD-10-CM

## 2019-06-08 NOTE — Progress Notes (Signed)
[facility] [place], [place] [address] Phone: [phone] Subjective:   I Renee Mann am serving as a Education administrator for Dr. Hulan Saas.  I'm seeing this patient by the request  of:    CC: back and buttocks pain follow-up  [id number]   05/08/2019 Right-sided lumbar radiculopathy.  Patient is concerned with her sugars moment did not want to do any type of steroid.  Meloxicam and gabapentin given today, discussed over-the-counter medicines, work with Product/process development scientist to learn home exercises in greater detail.  Follow-up with me again in 3 to 4 weeks.  Worsening symptoms will consider formal physical therapy.  If any weakness or decrease in deep tendon reflexes advanced imaging would be warranted.  06/09/2019 Renee Mann is a 63 y.o. female coming in with complaint of back pain. States she is a lot better. Not 100%. Pain is between a 6-7. Patient states that she is doing significantly better.  Would state that she is about 80% better.  States that continues to have some discomfort and pain but as long as she does the exercises it is manageable.  Not waking her up at night, feels that the medication, gabapentin is helping.     Past Medical History:  Diagnosis Date  . Diabetes mellitus 2008   type II  . Gastroesophageal reflux disease   . Hives   . Hypertension   . Osteoarthritis   . 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 ABDOMINAL HYSTERECTOMY  1987  . burritis surgery Left 1998   hip   Social History   Socioeconomic History  . Marital status: Married    Spouse name: Not on file  . Number of children: 1  . Years of education: Not on file  . Highest education level: Not on file  Occupational History  . 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 Financial resource strain: Not on file  . Food insecurity    Worry: Not on file    Inability: Not  on file  . Transportation needs    Medical: Not on file    Non-medical: Not on file  Tobacco Use  . Smoking status: Former Smoker    Quit date: 08/24/1998    Years since quitting: 20.8  . Smokeless tobacco: Never Used  Substance and Sexual Activity  . Alcohol use: No    Alcohol/week: 0.0 standard drinks  . Drug use: No  . Sexual activity: Yes  Lifestyle  . Physical activity    Days per week: Not on file    Minutes per session: Not on file  . Stress: Not on file  Relationships  . Social Herbalist on phone: Not on file    Gets together: Not on file    Attends religious service: Not on file    Active member of club or organization: Not on file    Attends meetings of clubs or organizations: Not on file    Relationship status: Not on file  Other Topics Concern  . Not on file  Social History Narrative   ** Merged History Encounter **       Allergies  Allergen Reactions  . Codeine Hives  . Atenolol     REACTION: WEIRD FEELINGS  . Hydrochlorothiazide W-Triamterene     REACTION: H/A   Family History  Problem Relation  Age of Onset  . Hypertension Mother   . Cancer Mother   . Stroke Mother   . Hypertension Sister   . Hypertension Other   . Colon cancer Neg Hx   . Esophageal cancer Neg Hx   . Stomach cancer Neg Hx   . Rectal cancer Neg Hx     Current Outpatient Medications (Endocrine & Metabolic):  .  metFORMIN (GLUCOPHAGE XR) 750 MG 24 hr tablet, Take 1 tablet (750 mg total) by mouth daily with breakfast. .  methylPREDNISolone (MEDROL DOSEPAK) 4 MG TBPK tablet, As directed   Current Outpatient Medications (Cardiovascular):  .  amLODipine (NORVASC) 5 MG tablet, Take 1 tablet by mouth once daily .  EPINEPHrine (EPIPEN 2-PAK) 0.3 mg/0.3 mL IJ SOAJ injection, Use as directed for severe allergic reaction   Current Outpatient Medications (Respiratory):  Renee Mann ALLERGY RELIEF 10 MG tablet, Take 1 tablet by mouth once daily .  levocetirizine (XYZAL) 5 MG tablet,  Take 1 tablet twice daily .  omalizumab (XOLAIR) 150 MG injection, INJECT 300MG SUBCUTANEOUSLY EVERY 28 DAYS (GIVEN AT MD  OFFICE)  Current Facility-Administered Medications (Respiratory):  .  omalizumab Arvid Right) injection 300 mg  Current Outpatient Medications (Analgesics):  .  meloxicam (MOBIC) 7.5 MG tablet, Take 1 tablet (7.5 mg total) by mouth daily.     Current Outpatient Medications (Other):  .  Blood Glucose Monitoring Suppl (ACCU-CHEK AVIVA PLUS) w/Device KIT, Use to check blood sugar daily. DX E11.9 .  clotrimazole-betamethasone (LOTRISONE) cream, Apply 1 application topically 2 (two) times daily. X 1 wk only .  gabapentin (NEURONTIN) 100 MG capsule, Take 2 capsules (200 mg total) by mouth at bedtime. [name]  glucose blood (ACCU-CHEK AVIVA PLUS) test strip, Use to check blood sugar daily. DX E11.9 .  glucose blood (ONE TOUCH ULTRA TEST) test strip, 1 each by Other route daily. Use to check blood sugars once a day .  glucose blood test strip, Use 1 times a day PRN. DX: E11.9 .  Lancets (ACCU-CHEK MULTICLIX) lancets, Use to check blood sugar daily. DX E11.9 .  ONETOUCH DELICA LANCETS FINE MISC, USE 1  TO CHECK GLUCOSE ONCE DAILY PRN .  tiZANidine (ZANAFLEX) 4 MG tablet, Take 1 tablet (4 mg total) by mouth every 8 (eight) hours as needed for muscle spasms. .  Triamcinolone Acetonide (TRIAMCINOLONE 0.1 % CREAM : EUCERIN) CREA, Apply 1 application topically 2 (two) times daily. [name]  gabapentin (NEURONTIN) 300 MG capsule, Take 1 capsule (300 mg total) by mouth 3 (three) times daily.     Past medical history, social, surgical and family history all reviewed in electronic medical record.  No pertanent information unless stated regarding to the chief complaint.   Review of Systems:  No headache, visual changes, nausea, vomiting, diarrhea, constipation, dizziness, abdominal pain, skin rash, fevers, chills, night sweats, weight loss, swollen lymph nodes, body aches, joint swelling,  chest  pain, shortness of breath, mood changes.  Positive muscle aches  Objective  Blood pressure (!) 150/70, pulse (!) 124, height _0  (1.473 m), weight 153 lb (69.4 kg), SpO2 97 %. Systems examined below as of    General: No apparent distress alert and oriented x3 mood and affect normal, dressed appropriately.  HEENT: Pupils equal, extraocular movements intact  Respiratory: Patient's speak in full sentences and does not appear short of breath  Cardiovascular: No lower extremity edema, non tender, no erythema  Skin: Warm dry intact with no signs of infection or rash on extremities or on  axial skeleton.  Abdomen: Soft nontender  Neuro: Cranial nerves II through XII are intact, neurovascularly intact in all extremities with 2+ DTRs and 2+ pulses.  Lymph: No lymphadenopathy of posterior or anterior cervical chain or axillae bilaterally.  Gait normal with good balance and coordination.  MSK:  Non tender with full range of motion and good stability and symmetric strength and tone of shoulders, elbows, wrist, , knee and ankles bilaterally.   Right hip exam shows the patient still tender to palpation in the piriformis and actually more bilateral.  Patient minimally tender to palpation in the paraspinal musculature lumbar spine.  No spinous process tenderness.  Improvement in range of motion of the hips bilaterally.  Neurovascular intact distally with no significant weakness noted today.   Impression and Recommendations:      The above documentation has been reviewed and is accurate and complete Lyndal Pulley, DO       Note: This dictation was prepared with Dragon dictation along with smaller phrase technology. Any transcriptional errors that result from this process are unintentional.

## 2019-06-09 ENCOUNTER — Other Ambulatory Visit: Payer: Self-pay

## 2019-06-09 ENCOUNTER — Encounter: Payer: Self-pay | Admitting: Family Medicine

## 2019-06-09 ENCOUNTER — Ambulatory Visit (INDEPENDENT_AMBULATORY_CARE_PROVIDER_SITE_OTHER): Payer: 59 | Admitting: Family Medicine

## 2019-06-09 DIAGNOSIS — M5416 Radiculopathy, lumbar region: Secondary | ICD-10-CM

## 2019-06-09 MED ORDER — GABAPENTIN 300 MG PO CAPS: 300.0000 mg | ORAL_CAPSULE | Freq: Three times a day (TID) | ORAL | 3 refills | Status: DC

## 2019-06-09 NOTE — Patient Instructions (Signed)
Good to see you Gabapentin 300 mg at night Keep going! See me again in 7-8 weeks

## 2019-06-09 NOTE — Assessment & Plan Note (Signed)
Discussed with patient in great length.  Discussed which activities of doing which wants to avoid.  Patient has made improvement.  Increase gabapentin to 300 mg.  Patient declines anything such as injection or formal physical therapy.  Follow-up again in 2 months to make sure patient is improving.

## 2019-06-20 ENCOUNTER — Other Ambulatory Visit: Payer: Self-pay

## 2019-06-20 ENCOUNTER — Ambulatory Visit (INDEPENDENT_AMBULATORY_CARE_PROVIDER_SITE_OTHER): Payer: 59

## 2019-06-20 DIAGNOSIS — L509 Urticaria, unspecified: Secondary | ICD-10-CM

## 2019-06-20 DIAGNOSIS — L501 Idiopathic urticaria: Secondary | ICD-10-CM | POA: Diagnosis not present

## 2019-07-10 ENCOUNTER — Encounter: Payer: Self-pay | Admitting: Internal Medicine

## 2019-07-12 ENCOUNTER — Other Ambulatory Visit (INDEPENDENT_AMBULATORY_CARE_PROVIDER_SITE_OTHER): Payer: 59

## 2019-07-12 ENCOUNTER — Ambulatory Visit (INDEPENDENT_AMBULATORY_CARE_PROVIDER_SITE_OTHER): Payer: 59 | Admitting: Internal Medicine

## 2019-07-12 ENCOUNTER — Other Ambulatory Visit: Payer: Self-pay | Admitting: Internal Medicine

## 2019-07-12 ENCOUNTER — Other Ambulatory Visit: Payer: Self-pay

## 2019-07-12 ENCOUNTER — Encounter: Payer: Self-pay | Admitting: Internal Medicine

## 2019-07-12 VITALS — BP 152/90 | HR 83 | Temp 98.3°F | Ht <= 58 in | Wt 156.0 lb

## 2019-07-12 DIAGNOSIS — E119 Type 2 diabetes mellitus without complications: Secondary | ICD-10-CM | POA: Diagnosis not present

## 2019-07-12 DIAGNOSIS — I10 Essential (primary) hypertension: Secondary | ICD-10-CM

## 2019-07-12 DIAGNOSIS — Z0000 Encounter for general adult medical examination without abnormal findings: Secondary | ICD-10-CM

## 2019-07-12 DIAGNOSIS — M5416 Radiculopathy, lumbar region: Secondary | ICD-10-CM

## 2019-07-12 DIAGNOSIS — K219 Gastro-esophageal reflux disease without esophagitis: Secondary | ICD-10-CM | POA: Diagnosis not present

## 2019-07-12 DIAGNOSIS — Z23 Encounter for immunization: Secondary | ICD-10-CM

## 2019-07-12 LAB — BASIC METABOLIC PANEL
BUN: 10 mg/dL (ref 6–23)
CO2: 28 mEq/L (ref 19–32)
Calcium: 9.6 mg/dL (ref 8.4–10.5)
Chloride: 103 mEq/L (ref 96–112)
Creatinine, Ser: 0.74 mg/dL (ref 0.40–1.20)
GFR: 95.34 mL/min (ref >60.00)
Glucose, Bld: 118 mg/dL — ABNORMAL HIGH (ref 70–99)
Potassium: 3.8 mEq/L (ref 3.5–5.1)
Sodium: 141 mEq/L (ref 135–145)

## 2019-07-12 LAB — HEMOGLOBIN A1C: Hgb A1c MFr Bld: 7.8 % — ABNORMAL HIGH (ref 4.6–6.5)

## 2019-07-12 MED ORDER — FAMOTIDINE 40 MG PO TABS: 40.0000 mg | ORAL_TABLET | Freq: Every day | ORAL | 3 refills | Status: DC

## 2019-07-12 NOTE — Progress Notes (Signed)
Subjective:  Patient ID: Renee Mann, female    DOB: 12/05/1955  Age: 63 y.o. MRN: [id number]  CC: No chief complaint on file.   HPI Addy A Pratt presents for LBP, HTN, DM f/u LBP is better; can be up to 7/10 sometimes. Gabapentin is helping  Outpatient Medications Prior to Visit  Medication Sig Dispense Refill  . amLODipine (NORVASC) 5 MG tablet Take 1 tablet by mouth once daily 30 tablet 11  . Blood Glucose Monitoring Suppl (ACCU-CHEK AVIVA PLUS) w/Device KIT Use to check blood sugar daily. DX E11.9 1 kit 0  . clotrimazole-betamethasone (LOTRISONE) cream Apply 1 application topically 2 (two) times daily. X 1 wk only 30 g 0  . EPINEPHrine (EPIPEN 2-PAK) 0.3 mg/0.3 mL IJ SOAJ injection Use as directed for severe allergic reaction 1 each 1  . EQ ALLERGY RELIEF 10 MG tablet Take 1 tablet by mouth once daily 100 tablet 3  . gabapentin (NEURONTIN) 100 MG capsule Take 2 capsules (200 mg total) by mouth at bedtime. 180 capsule 3  . gabapentin (NEURONTIN) 300 MG capsule Take 1 capsule (300 mg total) by mouth 3 (three) times daily. 90 capsule 3  . glucose blood (ACCU-CHEK AVIVA PLUS) test strip Use to check blood sugar daily. DX E11.9 100 each 12  . glucose blood (ONE TOUCH ULTRA TEST) test strip 1 each by Other route daily. Use to check blood sugars once a day 100 each 12  . glucose blood test strip Use 1 times a day PRN. DX: E11.9 100 each 3  . Lancets (ACCU-CHEK MULTICLIX) lancets Use to check blood sugar daily. DX E11.9 100 each 12  . levocetirizine (XYZAL) 5 MG tablet Take 1 tablet twice daily 60 tablet 11  . meloxicam (MOBIC) 7.5 MG tablet Take 1 tablet (7.5 mg total) by mouth daily. 30 tablet 0  . metFORMIN (GLUCOPHAGE XR) 750 MG 24 hr tablet Take 1 tablet (750 mg total) by mouth daily with breakfast. 60 tablet 11  . methylPREDNISolone (MEDROL DOSEPAK) 4 MG TBPK tablet As directed 21 tablet 0  . omalizumab (XOLAIR) 150 MG injection INJECT 300MG SUBCUTANEOUSLY EVERY 28 DAYS (GIVEN AT MD   OFFICE) 2 each 11  . ONETOUCH DELICA LANCETS FINE MISC USE 1  TO CHECK GLUCOSE ONCE DAILY PRN 100 each 3  . tiZANidine (ZANAFLEX) 4 MG tablet Take 1 tablet (4 mg total) by mouth every 8 (eight) hours as needed for muscle spasms. 30 tablet 1  . Triamcinolone Acetonide (TRIAMCINOLONE 0.1 % CREAM : EUCERIN) CREA Apply 1 application topically 2 (two) times daily. 1 each 2   Facility-Administered Medications Prior to Visit  Medication Dose Route Frequency Provider Last Rate Last Dose  . omalizumab Arvid Right) injection 300 mg  300 mg Subcutaneous Q28 days [name], MD   300 mg at 06/20/19 1642    ROS: Review of Systems  Constitutional: Positive for unexpected weight change. Negative for activity change, appetite change, chills and fatigue.  HENT: Negative for congestion, mouth sores and sinus pressure.   Eyes: Negative for visual disturbance.  Respiratory: Negative for cough and chest tightness.   Gastrointestinal: Negative for abdominal pain and nausea.  Genitourinary: Negative for difficulty urinating, frequency and vaginal pain.  Musculoskeletal: Positive for back pain and gait problem.  Skin: Negative for pallor and rash.  Neurological: Negative for dizziness, tremors, weakness, numbness and headaches.  Psychiatric/Behavioral: Negative for confusion, sleep disturbance and suicidal ideas.    Objective:  BP (!) 152/90 (BP Location: Left  Arm, Patient Position: Sitting, Cuff Size: Normal)   Pulse 83   Temp 98.3 F (36.8 C) (Oral)   Ht 4' 10"  (1.473 m)   Wt 156 lb (70.8 kg)   SpO2 99%   BMI 32.60 kg/m   BP Readings from Last 3 Encounters:  07/12/19 (!) 152/90  06/09/19 (!) 150/70  05/08/19 (!) 144/86    Wt Readings from Last 3 Encounters:  07/12/19 156 lb (70.8 kg)  06/09/19 153 lb (69.4 kg)  05/08/19 154 lb (69.9 kg)    Physical Exam Constitutional:      General: She is not in acute distress.    Appearance: She is well-developed. She is obese.  HENT:     Head:  Normocephalic.     Right Ear: External ear normal.     Left Ear: External ear normal.     Nose: Nose normal.  Eyes:     General:        Right eye: No discharge.        Left eye: No discharge.     Conjunctiva/sclera: Conjunctivae normal.     Pupils: Pupils are equal, round, and reactive to light.  Neck:     Musculoskeletal: Normal range of motion and neck supple.     Thyroid: No thyromegaly.     Vascular: No JVD.     Trachea: No tracheal deviation.  Cardiovascular:     Rate and Rhythm: Normal rate and regular rhythm.     Heart sounds: Normal heart sounds.  Pulmonary:     Effort: No respiratory distress.     Breath sounds: No stridor. No wheezing.  Abdominal:     General: Bowel sounds are normal. There is no distension.     Palpations: Abdomen is soft. There is no mass.     Tenderness: There is no abdominal tenderness. There is no guarding or rebound.  Musculoskeletal:        General: Tenderness present.  Lymphadenopathy:     Cervical: No cervical adenopathy.  Skin:    Findings: No erythema or rash.  Neurological:     Cranial Nerves: No cranial nerve deficit.     Motor: No abnormal muscle tone.     Coordination: Coordination normal.     Deep Tendon Reflexes: Reflexes normal.  Psychiatric:        Behavior: Behavior normal.        Thought Content: Thought content normal.        Judgment: Judgment normal.   LS spine w/pain on ROM Str leg elev (-) B  Lab Results  Component Value Date   WBC 8.0 10/10/2018   HGB 13.4 10/10/2018   HCT 41.3 10/10/2018   PLT 466.0 (H) 10/10/2018   GLUCOSE 112 (H) 04/11/2019   CHOL 165 10/10/2018   TRIG 71.0 10/10/2018   HDL 36.40 (L) 10/10/2018   LDLCALC 115 (H) 10/10/2018   ALT 24 10/10/2018   AST 17 10/10/2018   NA 139 04/11/2019   K 3.9 04/11/2019   CL 102 04/11/2019   CREATININE 0.74 04/11/2019   BUN 11 04/11/2019   CO2 27 04/11/2019   TSH 2.20 10/10/2018   HGBA1C 7.6 (H) 04/11/2019   MICROALBUR <0.7 10/10/2018    Dg  Lumbar Spine 2-3 Views  Result Date: 04/11/2019 CLINICAL DATA:  Low back pain with right lower extremity radicular some EXAM: LUMBAR SPINE - 2-3 VIEW COMPARISON:  None. FINDINGS: Frontal, lateral, and spot lumbosacral lateral images were obtained. There are 5 non-rib-bearing lumbar type vertebral  bodies. There is no fracture or spondylolisthesis. There is mild disc space narrowing at L3-4, L4-5, and L5-S1. There is facet arthropathy at L4-5 and L5-S1 bilaterally. No erosive changes. IMPRESSION: Lower lumbar arthropathy.  No fracture or spondylolisthesis. Electronically Signed   By: Lowella Grip III M.D.   On: 04/11/2019 08:50    Assessment & Plan:   There are no diagnoses linked to this encounter.   No orders of the defined types were placed in this encounter.    Follow-up: No follow-ups on file.  Walker Kehr, MD

## 2019-07-12 NOTE — Assessment & Plan Note (Signed)
TUMs prn Pepcid qd

## 2019-07-12 NOTE — Assessment & Plan Note (Signed)
Metformin Diet

## 2019-07-12 NOTE — Assessment & Plan Note (Signed)
nl BP at home per pt 

## 2019-07-12 NOTE — Assessment & Plan Note (Signed)
F/u w/Dr Tamala Julian LBP is better; can be up to 7/10 sometimes. Gabapentin is helping

## 2019-07-13 DIAGNOSIS — Z23 Encounter for immunization: Secondary | ICD-10-CM | POA: Diagnosis not present

## 2019-07-13 NOTE — Addendum Note (Signed)
Addended by: Karren Cobble on: 07/13/2019 09:19 AM   Modules accepted: Orders

## 2019-07-18 ENCOUNTER — Ambulatory Visit (INDEPENDENT_AMBULATORY_CARE_PROVIDER_SITE_OTHER): Payer: 59

## 2019-07-18 ENCOUNTER — Other Ambulatory Visit: Payer: Self-pay

## 2019-07-18 DIAGNOSIS — L509 Urticaria, unspecified: Secondary | ICD-10-CM

## 2019-07-18 DIAGNOSIS — L501 Idiopathic urticaria: Secondary | ICD-10-CM | POA: Diagnosis not present

## 2019-07-28 ENCOUNTER — Encounter: Payer: Self-pay | Admitting: Family Medicine

## 2019-07-28 ENCOUNTER — Ambulatory Visit: Payer: 59 | Admitting: Family Medicine

## 2019-07-28 ENCOUNTER — Other Ambulatory Visit: Payer: Self-pay

## 2019-07-28 VITALS — BP 158/94 | HR 104 | Ht <= 58 in | Wt 152.0 lb

## 2019-07-28 DIAGNOSIS — M62838 Other muscle spasm: Secondary | ICD-10-CM

## 2019-07-28 DIAGNOSIS — G8929 Other chronic pain: Secondary | ICD-10-CM | POA: Diagnosis not present

## 2019-07-28 DIAGNOSIS — M25561 Pain in right knee: Secondary | ICD-10-CM

## 2019-07-28 NOTE — Progress Notes (Signed)
[facility] [place], [place] [address] Phone: [phone] Subjective:   Renee Mann, am serving as a scribe for Dr. Hulan Saas. This visit occurred during the SARS-CoV-2 public health emergency.  Safety protocols were in place, including screening questions prior to the visit, additional usage of staff PPE, and extensive cleaning of exam room while observing appropriate contact time as indicated for disinfecting solutions.    CC: Neck pain  [id number]   06/09/2019 Discussed with patient in great length.  Discussed which activities of doing which wants to avoid.  Patient has made improvement.  Increase gabapentin to 300 mg.  Patient declines anything such as injection or formal physical therapy.  Follow-up again in 2 months to make sure patient is improving.  Update 07/28/2019 Renee Mann is a 63 y.o. female coming in with complaint of hip and back pain have resolved. Patient is now having right sided cervical spine pain. Not sleeping due to pain. Pain radiates up into base of skull. Using Tylenol for pain.   Also states that she has been having right knee issues where her knee will give out on her.  Very intermittent.  Nothing that stops her from activity.  Patient is just concerned to make it worse.    Past Medical History:  Diagnosis Date  . Diabetes mellitus 2008   type II  . Gastroesophageal reflux disease   . Hives   . Hypertension   . Osteoarthritis   . 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 ABDOMINAL HYSTERECTOMY  1987  . burritis surgery Left 1998   hip   Social History   Socioeconomic History  . Marital status: Married    Spouse name: Not on file  . Number of children: 1  . Years of education: Not on file  . Highest education level: Not on file  Occupational History  . 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 Financial  resource strain: Not on file  . Food insecurity    Worry: Not on file    Inability: Not on file  . Transportation needs    Medical: Not on file    Non-medical: Not on file  Tobacco Use  . Smoking status: Former Smoker    Quit date: 08/24/1998    Years since quitting: 20.9  . Smokeless tobacco: Never Used  Substance and Sexual Activity  . Alcohol use: Mann    Alcohol/week: 0.0 standard drinks  . Drug use: Mann  . Sexual activity: Yes  Lifestyle  . Physical activity    Days per week: Not on file    Minutes per session: Not on file  . Stress: Not on file  Relationships  . Social Herbalist on phone: Not on file    Gets together: Not on file    Attends religious service: Not on file    Active member of club or organization: Not on file    Attends meetings of clubs or organizations: Not on file    Relationship status: Not on file  Other Topics Concern  . Not on file  Social History Narrative   ** Merged History Encounter **       Allergies  Allergen Reactions  . Codeine Hives  . Atenolol     REACTION: WEIRD FEELINGS  . Hydrochlorothiazide W-Triamterene  REACTION: H/A   Family History  Problem Relation Age of Onset  . Hypertension Mother   . Cancer Mother   . Stroke Mother   . Hypertension Sister   . Hypertension Other   . Colon cancer Neg Hx   . Esophageal cancer Neg Hx   . Stomach cancer Neg Hx   . Rectal cancer Neg Hx     Current Outpatient Medications (Endocrine & Metabolic):  .  metFORMIN (GLUCOPHAGE XR) 750 MG 24 hr tablet, Take 1 tablet (750 mg total) by mouth daily with breakfast. .  methylPREDNISolone (MEDROL DOSEPAK) 4 MG TBPK tablet, As directed   Current Outpatient Medications (Cardiovascular):  .  amLODipine (NORVASC) 5 MG tablet, Take 1 tablet by mouth once daily .  EPINEPHrine (EPIPEN 2-PAK) 0.3 mg/0.3 mL IJ SOAJ injection, Use as directed for severe allergic reaction   Current Outpatient Medications (Respiratory):  Noelle Penner ALLERGY  RELIEF 10 MG tablet, Take 1 tablet by mouth once daily .  levocetirizine (XYZAL) 5 MG tablet, Take 1 tablet twice daily .  omalizumab (XOLAIR) 150 MG injection, INJECT '300MG'$  SUBCUTANEOUSLY EVERY 28 DAYS (GIVEN AT MD  OFFICE)  Current Facility-Administered Medications (Respiratory):  .  omalizumab Arvid Right) injection 300 mg      Current Outpatient Medications (Other):  .  Blood Glucose Monitoring Suppl (ACCU-CHEK AVIVA PLUS) w/Device KIT, Use to check blood sugar daily. DX E11.9 .  clotrimazole-betamethasone (LOTRISONE) cream, Apply 1 application topically 2 (two) times daily. X 1 wk only .  famotidine (PEPCID) 40 MG tablet, Take 1 tablet (40 mg total) by mouth daily. [name]  gabapentin (NEURONTIN) 300 MG capsule, Take 1 capsule (300 mg total) by mouth 3 (three) times daily. [name]  glucose blood (ACCU-CHEK AVIVA PLUS) test strip, Use to check blood sugar daily. DX E11.9 .  glucose blood (ONE TOUCH ULTRA TEST) test strip, 1 each by Other route daily. Use to check blood sugars once a day .  glucose blood test strip, Use 1 times a day PRN. DX: E11.9 .  Lancets (ACCU-CHEK MULTICLIX) lancets, Use to check blood sugar daily. DX E11.9 .  ONETOUCH DELICA LANCETS FINE MISC, USE 1  TO CHECK GLUCOSE ONCE DAILY PRN .  tiZANidine (ZANAFLEX) 4 MG tablet, Take 1 tablet (4 mg total) by mouth every 8 (eight) hours as needed for muscle spasms. .  Triamcinolone Acetonide (TRIAMCINOLONE 0.1 % CREAM : EUCERIN) CREA, Apply 1 application topically 2 (two) times daily.     Past medical history, social, surgical and family history all reviewed in electronic medical record.  Mann pertanent information unless stated regarding to the chief complaint.   Review of Systems:  Mann headache, visual changes, nausea, vomiting, diarrhea, constipation, dizziness, abdominal pain, skin rash, fevers, chills, night sweats, weight loss, swollen lymph nodes, body aches, joint swelling, chest pain, shortness of breath, mood changes.  Positive  muscle aches  Objective  Blood pressure (!) 158/94, pulse (!) 104, height '4\' 10"'$  (1.473 m), weight 152 lb (68.9 kg), SpO2 95 %.    General: Mann apparent distress alert and oriented x3 mood and affect normal, dressed appropriately.  HEENT: Pupils equal, extraocular movements intact  Respiratory: Patient's speak in full sentences and does not appear short of breath  Cardiovascular: Mann lower extremity edema, non tender, Mann erythema  Skin: Warm dry intact with Mann signs of infection or rash on extremities or on axial skeleton.  Abdomen: Soft nontender  Neuro: Cranial nerves II through XII are intact, neurovascularly intact in all  extremities with 2+ DTRs and 2+ pulses.  Lymph: Mann lymphadenopathy of posterior or anterior cervical chain or axillae bilaterally.  Gait normal with good balance and coordination.  MSK:  tender with ltd range of motion and good stability and symmetric strength and tone of shoulders, elbows, wrist, hip, and ankles bilaterally.  Mild to moderate arthritic changes of multiple joints Neck exam does have loss of lordosis.  Negative Spurling's patient does have some mild tightness noted 5-5 strength of the upper extremities.  Patient's tightness seems to be mostly in the sternocleidomastoid.  Worsening pain with right-sided rotation and left-sided sidebending  Left knee exam shows some mild lateral tracking of the patella with a positive patellar grind test.  Mann significant Mann instability of the knee itself.  Very mild crepitus with range of motion.  Contralateral knee unremarkable.  [id number]; 15 additional minutes spent for Therapeutic exercises as stated in above notes.  This included exercises focusing on stretching, strengthening, with significant focus on eccentric aspects.   Long term goals include an improvement in range of motion, strength, endurance as well as avoiding reinjury. Patient's frequency would include in 1-2 times a day, 3-5 times a week for a duration of 6-12  weeks. Exercises that included:  Basic scapular stabilization to include adduction and depression of scapula Scaption, focusing on proper movement and good control Internal and External rotation utilizing a theraband, with elbow tucked at side entire time Rows with theraband   Proper technique shown and discussed handout in great detail with ATC.  All questions were discussed and answered.     Impression and Recommendations:     This case required medical decision making of moderate complexity. The above documentation has been reviewed and is accurate and complete Lyndal Pulley, DO       Note: This dictation was prepared with Dragon dictation along with smaller phrase technology. Any transcriptional errors that result from this process are unintentional.

## 2019-07-28 NOTE — Assessment & Plan Note (Signed)
Significant tightness in the neck, patient declined any type of trigger point secondary to patient having fragile diabetes.  Discussed with patient about over-the-counter medications that I think will be beneficial, encouraged her to restart the gabapentin which she was having good success with with the leg at least at night.  Posture and ergonomics given follow-up again in 4 to 8 weeks worsening pain will consider formal physical therapy as well as neck x-rays no radicular symptoms as discussed

## 2019-07-28 NOTE — Patient Instructions (Addendum)
  Start with heat and end with ice for 20 minutes each Gabapentin at night 200mg  Voltaren gel 2x a day as needed Tylenol twice daily Happy Holidays See me again in 6 weeks     [address], [place], [place] [address] Phone [phone]

## 2019-08-15 ENCOUNTER — Other Ambulatory Visit: Payer: Self-pay

## 2019-08-15 ENCOUNTER — Ambulatory Visit (INDEPENDENT_AMBULATORY_CARE_PROVIDER_SITE_OTHER): Payer: 59

## 2019-08-15 DIAGNOSIS — L509 Urticaria, unspecified: Secondary | ICD-10-CM | POA: Diagnosis not present

## 2019-09-04 ENCOUNTER — Ambulatory Visit: Payer: 59 | Admitting: Family Medicine

## 2019-09-04 ENCOUNTER — Other Ambulatory Visit: Payer: Self-pay

## 2019-09-04 ENCOUNTER — Encounter: Payer: Self-pay | Admitting: Family Medicine

## 2019-09-04 DIAGNOSIS — M7061 Trochanteric bursitis, right hip: Secondary | ICD-10-CM | POA: Diagnosis not present

## 2019-09-04 MED ORDER — DULOXETINE HCL 20 MG PO CPEP: 20.0000 mg | ORAL_CAPSULE | Freq: Every day | ORAL | 0 refills | Status: DC

## 2019-09-04 NOTE — Patient Instructions (Addendum)
Cymbalta 20 daily   Good to see you Exercise 3 times  See me again in 6 weeks

## 2019-09-04 NOTE — Progress Notes (Signed)
[facility] [address] [place] Phone: [phone] Subjective:   I [name] am serving as a Education administrator for Dr. Hulan Saas.  This visit occurred during the SARS-CoV-2 public health emergency.  Safety protocols were in place, including screening questions prior to the visit, additional usage of staff PPE, and extensive cleaning of exam room while observing appropriate contact time as indicated for disinfecting solutions.   I'm seeing this patient by the request  of:    CC: Hip pain and back pain follow-up  [id number]   07/28/2019 Significant tightness in the neck, patient declined any type of trigger point secondary to patient having fragile diabetes.  Discussed with patient about over-the-counter medications that I think will be beneficial, encouraged her to restart the gabapentin which she was having good success with with the leg at least at night.  Posture and ergonomics given follow-up again in 4 to 8 weeks worsening pain will consider formal physical therapy as well as neck x-rays no radicular symptoms as discussed  09/04/2019 Renee Mann is a 64 y.o. female coming in with complaint of right sided hip pain. Patient states the pain in her hip is worse with walking. Pain radiates to her back as well.  Patient has had x-rays of the back, independently visualized by me showing facet arthropathy at L4-L5 and L5-S1.  Patient states that the pain is on the lateral aspect of the hip.  Waking her up at night.  Certain movements seem to make it worse.  Rates the severity of pain is 7 out of 10.  Seems to be chronic at this time and not improving.    Past Medical History:  Diagnosis Date  . Diabetes mellitus 2008   type II  . Gastroesophageal reflux disease   . Hives   . Hypertension   . Osteoarthritis   . 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 ABDOMINAL HYSTERECTOMY  1987  .  burritis surgery Left 1998   hip   Social History   Socioeconomic History  . Marital status: Married    Spouse name: Not on file  . Number of children: 1  . Years of education: Not on file  . Highest education level: Not on file  Occupational History  . 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 Smoking status: Former Smoker    Quit date: 08/24/1998    Years since quitting: 21.0  . Smokeless tobacco: Never Used  Substance and Sexual Activity  . Alcohol use: No    Alcohol/week: 0.0 standard drinks  . Drug use: No  . Sexual activity: Yes  Other Topics Concern  . Not on file  Social History Narrative   ** Merged History Encounter **       Social Determinants of Health   Financial Resource Strain:   . Difficulty of Paying Living Expenses: Not on file  Food Insecurity:   . Worried About Charity fundraiser in the Last Year: Not on file  . Ran Out of Food in the Last Year: Not on file  Transportation Needs:   . Lack of Transportation (Medical): Not on file  . Lack of Transportation (Non-Medical): Not on file  Physical Activity:   . Days of Exercise per Week: Not on file  . Minutes of Exercise per Session: Not on file  Stress:   .  Feeling of Stress : Not on file  Social Connections:   . Frequency of Communication with Friends and Family: Not on file  . Frequency of Social Gatherings with Friends and Family: Not on file  . Attends Religious Services: Not on file  . Active Member of Clubs or Organizations: Not on file  . Attends Archivist Meetings: Not on file  . Marital Status: Not on file   Allergies  Allergen Reactions  . Codeine Hives  . Atenolol     REACTION: WEIRD FEELINGS  . Hydrochlorothiazide W-Triamterene     REACTION: H/A   Family History  Problem Relation Age of Onset  . Hypertension Mother   . Cancer Mother   . Stroke Mother   . Hypertension Sister   . Hypertension Other   . Colon cancer Neg Hx   .  Esophageal cancer Neg Hx   . Stomach cancer Neg Hx   . Rectal cancer Neg Hx     Current Outpatient Medications (Endocrine & Metabolic):  .  metFORMIN (GLUCOPHAGE XR) 750 MG 24 hr tablet, Take 1 tablet (750 mg total) by mouth daily with breakfast. .  methylPREDNISolone (MEDROL DOSEPAK) 4 MG TBPK tablet, As directed   Current Outpatient Medications (Cardiovascular):  .  amLODipine (NORVASC) 5 MG tablet, Take 1 tablet by mouth once daily .  EPINEPHrine (EPIPEN 2-PAK) 0.3 mg/0.3 mL IJ SOAJ injection, Use as directed for severe allergic reaction   Current Outpatient Medications (Respiratory):  Noelle Penner ALLERGY RELIEF 10 MG tablet, Take 1 tablet by mouth once daily .  levocetirizine (XYZAL) 5 MG tablet, Take 1 tablet twice daily .  omalizumab (XOLAIR) 150 MG injection, INJECT '300MG'$  SUBCUTANEOUSLY EVERY 28 DAYS (GIVEN AT MD  OFFICE)  Current Facility-Administered Medications (Respiratory):  .  omalizumab Arvid Right) injection 300 mg      Current Outpatient Medications (Other):  .  Blood Glucose Monitoring Suppl (ACCU-CHEK AVIVA PLUS) w/Device KIT, Use to check blood sugar daily. DX E11.9 .  clotrimazole-betamethasone (LOTRISONE) cream, Apply 1 application topically 2 (two) times daily. X 1 wk only .  famotidine (PEPCID) 40 MG tablet, Take 1 tablet (40 mg total) by mouth daily. [name]  gabapentin (NEURONTIN) 300 MG capsule, Take 1 capsule (300 mg total) by mouth 3 (three) times daily. [name]  glucose blood (ACCU-CHEK AVIVA PLUS) test strip, Use to check blood sugar daily. DX E11.9 .  glucose blood (ONE TOUCH ULTRA TEST) test strip, 1 each by Other route daily. Use to check blood sugars once a day .  glucose blood test strip, Use 1 times a day PRN. DX: E11.9 .  tiZANidine (ZANAFLEX) 4 MG tablet, Take 1 tablet (4 mg total) by mouth every 8 (eight) hours as needed for muscle spasms. .  Triamcinolone Acetonide (TRIAMCINOLONE 0.1 % CREAM : EUCERIN) CREA, Apply 1 application topically 2 (two) times daily. .   DULoxetine (CYMBALTA) 20 MG capsule, Take 1 capsule (20 mg total) by mouth daily.     Past medical history, social, surgical and family history all reviewed in electronic medical record.  No pertanent information unless stated regarding to the chief complaint.   Review of Systems:  No headache, visual changes, nausea, vomiting, diarrhea, constipation, dizziness, abdominal pain, skin rash, fevers, chills, night sweats, weight loss, swollen lymph nodes, body aches, joint swelling,  chest pain, shortness of breath, mood changes.  Positive muscle aches  Objective  Blood pressure (!) 150/90, pulse 89, height '4\' 10"'$  (1.473 m), weight 153 lb (69.4 kg),  SpO2 98 %. Systems examined below as of    General: No apparent distress alert and oriented x3 mood and affect normal, dressed appropriately.  HEENT: Pupils equal, extraocular movements intact  Respiratory: Patient's speak in full sentences and does not appear short of breath  Cardiovascular: No lower extremity edema, non tender, no erythema  Skin: Warm dry intact with no signs of infection or rash on extremities or on axial skeleton.  Abdomen: Soft nontender  Neuro: Cranial nerves II through XII are intact, neurovascularly intact in all extremities with 2+ DTRs and 2+ pulses.  Lymph: No lymphadenopathy of posterior or anterior cervical chain or axillae bilaterally.  Gait normal with good balance and coordination.  MSK:  tender with limited range of motion mild arthritic changes of multiple joints  Right hip does have some tenderness to palpation mostly over the greater trochanteric area with a positive FABER test.  Tightness with straight leg test but no radicular symptoms.  Mild pain in the paraspinal musculature of the lumbar 5-5 strength in lower extremities.  After verbal consent patient was prepped with alcohol swabs and with a 21-gauge 2 inch needle injected with 0.5 cc of 1 cc of Kenalog 40 mg/mL.  No blood loss.  Postinjection instructions  given.    Impression and Recommendations:     This case required medical decision making of moderate complexity. The above documentation has been reviewed and is accurate and complete Lyndal Pulley, DO       Note: This dictation was prepared with Dragon dictation along with smaller phrase technology. Any transcriptional errors that result from this process are unintentional.

## 2019-09-04 NOTE — Assessment & Plan Note (Signed)
Patient given injection today.  Tolerated the procedure well.  Decided to do this as well as start Cymbalta at a low dose.  Discussed icing regimen and home exercise, differential includes a lumbar radiculopathy and encourage patient to continue to take the gabapentin.  Follow-up with me again in 4 to 8 weeks.

## 2019-09-12 ENCOUNTER — Ambulatory Visit (INDEPENDENT_AMBULATORY_CARE_PROVIDER_SITE_OTHER): Payer: 59

## 2019-09-12 ENCOUNTER — Other Ambulatory Visit: Payer: Self-pay

## 2019-09-12 DIAGNOSIS — L501 Idiopathic urticaria: Secondary | ICD-10-CM | POA: Diagnosis not present

## 2019-09-12 DIAGNOSIS — L509 Urticaria, unspecified: Secondary | ICD-10-CM

## 2019-10-10 ENCOUNTER — Ambulatory Visit (INDEPENDENT_AMBULATORY_CARE_PROVIDER_SITE_OTHER): Payer: 59

## 2019-10-10 ENCOUNTER — Other Ambulatory Visit: Payer: Self-pay

## 2019-10-10 DIAGNOSIS — L501 Idiopathic urticaria: Secondary | ICD-10-CM | POA: Diagnosis not present

## 2019-10-10 DIAGNOSIS — L509 Urticaria, unspecified: Secondary | ICD-10-CM

## 2019-10-12 ENCOUNTER — Ambulatory Visit: Payer: 59 | Admitting: Internal Medicine

## 2019-10-15 NOTE — Progress Notes (Signed)
[facility] [address] [place] Phone: [phone] Subjective:   I Renee Mann am serving as a Education administrator for Dr. Hulan Saas.  This visit occurred during the SARS-CoV-2 public health emergency.  Safety protocols were in place, including screening questions prior to the visit, additional usage of staff PPE, and extensive cleaning of exam room while observing appropriate contact time as indicated for disinfecting solutions.   I'm seeing this patient by the request  of:  Plotnikov, [name], MD  CC: Low back pain follow-up  LPF:[id number]   09/04/2019 Patient given injection today.  Tolerated the procedure well.  Decided to do this as well as start Cymbalta at a low dose.  Discussed icing regimen and home exercise, differential includes a lumbar radiculopathy and encourage patient to continue to take the gabapentin.  Follow-up with me again in 4 to 8 weeks.  Update 10/18/2019 Renee Mann is a 64 y.o. female coming in with complaint of right hip pain. Patient states sometimes she feels good and sometimes she can hardly move. Getting up out of a seated position is difficult. Doesn't think she is getting better.  Patient states that weather seems to play a role in some of the discomfort and pain.  Patient states some mild radiation down the left leg as well. Patient did have back x-rays previously.  These were independently visualized by me showing facet arthropathy at L4-L5 and L5-S1 bilaterally     Past Medical History:  Diagnosis Date  . Diabetes mellitus 2008   type II  . Gastroesophageal reflux disease   . Hives   . Hypertension   . Osteoarthritis   . 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 ABDOMINAL HYSTERECTOMY  1987  . burritis surgery Left 1998   hip   Social History   Socioeconomic History  . Marital status: Married    Spouse name: Not on file  . Number of children: 1   . Years of education: Not on file  . Highest education level: Not on file  Occupational History  . 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 Smoking status: Former Smoker    Quit date: 08/24/1998    Years since quitting: 21.1  . Smokeless tobacco: Never Used  Substance and Sexual Activity  . Alcohol use: No    Alcohol/week: 0.0 standard drinks  . Drug use: No  . Sexual activity: Yes  Other Topics Concern  . Not on file  Social History Narrative   ** Merged History Encounter **       Social Determinants of Health   Financial Resource Strain:   . Difficulty of Paying Living Expenses: Not on file  Food Insecurity:   . Worried About Charity fundraiser in the Last Year: Not on file  . Ran Out of Food in the Last Year: Not on file  Transportation Needs:   . Lack of Transportation (Medical): Not on file  . Lack of Transportation (Non-Medical): Not on file  Physical Activity:   . Days of Exercise per Week: Not on file  . Minutes of Exercise per Session: Not on file  Stress:   . Feeling of Stress : Not on file  Social Connections:   . Frequency of Communication with Friends and Family: Not on file  . Frequency of Social Gatherings with Friends and  Family: Not on file  . Attends Religious Services: Not on file  . Active Member of Clubs or Organizations: Not on file  . Attends Archivist Meetings: Not on file  . Marital Status: Not on file   Allergies  Allergen Reactions  . Codeine Hives  . Atenolol     REACTION: WEIRD FEELINGS  . Hydrochlorothiazide W-Triamterene     REACTION: H/A   Family History  Problem Relation Age of Onset  . Hypertension Mother   . Cancer Mother   . Stroke Mother   . Hypertension Sister   . Hypertension Other   . Colon cancer Neg Hx   . Esophageal cancer Neg Hx   . Stomach cancer Neg Hx   . Rectal cancer Neg Hx     Current Outpatient Medications (Endocrine & Metabolic):  .  metFORMIN  (GLUCOPHAGE XR) 750 MG 24 hr tablet, Take 1 tablet (750 mg total) by mouth daily with breakfast. .  methylPREDNISolone (MEDROL DOSEPAK) 4 MG TBPK tablet, As directed   Current Outpatient Medications (Cardiovascular):  .  amLODipine (NORVASC) 5 MG tablet, Take 1 tablet by mouth once daily .  EPINEPHrine (EPIPEN 2-PAK) 0.3 mg/0.3 mL IJ SOAJ injection, Use as directed for severe allergic reaction   Current Outpatient Medications (Respiratory):  Noelle Penner ALLERGY RELIEF 10 MG tablet, Take 1 tablet by mouth once daily .  levocetirizine (XYZAL) 5 MG tablet, Take 1 tablet twice daily .  omalizumab (XOLAIR) 150 MG injection, INJECT 300MG SUBCUTANEOUSLY EVERY 28 DAYS (GIVEN AT MD  OFFICE)  Current Facility-Administered Medications (Respiratory):  .  omalizumab Arvid Right) injection 300 mg      Current Outpatient Medications (Other):  .  Blood Glucose Monitoring Suppl (ACCU-CHEK AVIVA PLUS) w/Device KIT, Use to check blood sugar daily. DX E11.9 .  clotrimazole-betamethasone (LOTRISONE) cream, Apply 1 application topically 2 (two) times daily. X 1 wk only .  famotidine (PEPCID) 40 MG tablet, Take 1 tablet (40 mg total) by mouth daily. [name]  gabapentin (NEURONTIN) 300 MG capsule, Take 1 capsule (300 mg total) by mouth 3 (three) times daily. [name]  glucose blood (ACCU-CHEK AVIVA PLUS) test strip, Use to check blood sugar daily. DX E11.9 .  glucose blood (ONE TOUCH ULTRA TEST) test strip, 1 each by Other route daily. Use to check blood sugars once a day .  glucose blood test strip, Use 1 times a day PRN. DX: E11.9 .  tiZANidine (ZANAFLEX) 4 MG tablet, Take 1 tablet (4 mg total) by mouth every 8 (eight) hours as needed for muscle spasms. .  Triamcinolone Acetonide (TRIAMCINOLONE 0.1 % CREAM : EUCERIN) CREA, Apply 1 application topically 2 (two) times daily. .  DULoxetine (CYMBALTA) 30 MG capsule, Take 1 capsule (30 mg total) by mouth daily.    Reviewed prior external information including notes and imaging  from  primary care provider As well as notes that were available from care everywhere and other healthcare systems.  Past medical history, social, surgical and family history all reviewed in electronic medical record.  No pertanent information unless stated regarding to the chief complaint.   Review of Systems:  No headache, visual changes, nausea, vomiting, diarrhea, constipation, dizziness, abdominal pain, skin rash, fevers, chills, night sweats, weight loss, swollen lymph nodes,  joint swelling, chest pain, shortness of breath, mood changes. POSITIVE muscle aches, body aches  Objective  Blood pressure (!) 144/90, pulse 90, height 4' 10"  (1.473 m), weight 155 lb (70.3 kg), SpO2 98 %.   General:  No apparent distress alert and oriented x3 mood and affect normal, dressed appropriately.  HEENT: Pupils equal, extraocular movements intact  Respiratory: Patient's speak in full sentences and does not appear short of breath  Cardiovascular: No lower extremity edema, non tender, no erythema  Skin: Warm dry intact with no signs of infection or rash on extremities or on axial skeleton.  Abdomen: Soft nontender  Neuro: Cranial nerves II through XII are intact, neurovascularly intact in all extremities with 2+ DTRs and 2+ pulses.  Lymph: No lymphadenopathy of posterior or anterior cervical chain or axillae bilaterally.  Gait normal with good balance and coordination.  MSK:  Non tender with full range of motion and good stability and symmetric strength and tone of shoulders, elbows, wrist, hip, knee and ankles bilaterally.  Back Exam:  Inspection: Significant loss of lordosis Motion: Flexion 40 deg, Extension 25 deg, Side Bending to 35 deg bilaterally,  Rotation to 45 deg bilaterally  SLR laying: Negative significant tightness of the hamstrings bilaterally XSLR laying: Negative  Palpable tenderness: Tender to palpation in the paraspinal musculature lumbar spine right greater than left. FABER:  Tightness bilaterally.  Right greater than left Sensory change: Gross sensation intact to all lumbar and sacral dermatomes.  Reflexes: 2+ at both patellar tendons, 2+ at achilles tendons, Babinski's downgoing.  Strength at foot  Plantar-flexion: 5/5 Dorsi-flexion: 5/5 Eversion: 5/5 Inversion: 5/5  Leg strength  Quad: 5/5 Hamstring: 5/5 Hip flexor: 5/5 Hip abductors: 5/5      Impression and Recommendations:     This case required medical decision making of moderate complexity. The above documentation has been reviewed and is accurate and complete Lyndal Pulley, DO       Note: This dictation was prepared with Dragon dictation along with smaller phrase technology. Any transcriptional errors that result from this process are unintentional.

## 2019-10-16 ENCOUNTER — Ambulatory Visit: Payer: 59 | Admitting: Family Medicine

## 2019-10-18 ENCOUNTER — Ambulatory Visit: Payer: 59 | Admitting: Family Medicine

## 2019-10-18 ENCOUNTER — Encounter: Payer: Self-pay | Admitting: Family Medicine

## 2019-10-18 ENCOUNTER — Other Ambulatory Visit: Payer: Self-pay

## 2019-10-18 VITALS — BP 144/90 | HR 90 | Ht <= 58 in | Wt 155.0 lb

## 2019-10-18 DIAGNOSIS — M25551 Pain in right hip: Secondary | ICD-10-CM

## 2019-10-18 DIAGNOSIS — M5416 Radiculopathy, lumbar region: Secondary | ICD-10-CM | POA: Diagnosis not present

## 2019-10-18 MED ORDER — DULOXETINE HCL 30 MG PO CPEP: 30.0000 mg | ORAL_CAPSULE | Freq: Every day | ORAL | 3 refills | Status: DC

## 2019-10-18 NOTE — Patient Instructions (Addendum)
PT [address] Cymbalta 30 Continue gabapentin See me again in 3-4 weeks we will discuss MRI

## 2019-10-18 NOTE — Assessment & Plan Note (Addendum)
Patient continues to have some difficulty with more of the right-sided lumbar radiculopathy.  Has known underlying arthritic changes.  Chronic problem with mild exacerbation and no significant improvement.  Discussed medical management with change patient Cymbalta to 30 mg.  Continue the gabapentin at the regular dosing at the moment.  Start formal physical therapy.  If no significant improvement in 4 to 5 weeks we will need to consider the possibility of MRI.  Social determinants of health including financial constraints, transportation issues, as well as patient having many comorbidities during pandemic.

## 2019-10-26 ENCOUNTER — Encounter: Payer: Self-pay | Admitting: Internal Medicine

## 2019-10-26 ENCOUNTER — Other Ambulatory Visit: Payer: Self-pay | Admitting: *Deleted

## 2019-10-26 ENCOUNTER — Ambulatory Visit: Payer: 59 | Admitting: Internal Medicine

## 2019-10-26 ENCOUNTER — Other Ambulatory Visit: Payer: Self-pay

## 2019-10-26 DIAGNOSIS — M5416 Radiculopathy, lumbar region: Secondary | ICD-10-CM

## 2019-10-26 DIAGNOSIS — E119 Type 2 diabetes mellitus without complications: Secondary | ICD-10-CM

## 2019-10-26 DIAGNOSIS — I10 Essential (primary) hypertension: Secondary | ICD-10-CM

## 2019-10-26 LAB — BASIC METABOLIC PANEL
BUN: 11 mg/dL (ref 6–23)
CO2: 30 mEq/L (ref 19–32)
Calcium: 10 mg/dL (ref 8.4–10.5)
Chloride: 101 mEq/L (ref 96–112)
Creatinine, Ser: 0.79 mg/dL (ref 0.40–1.20)
GFR: 88.33 mL/min (ref >60.00)
Glucose, Bld: 134 mg/dL — ABNORMAL HIGH (ref 70–99)
Potassium: 4.1 mEq/L (ref 3.5–5.1)
Sodium: 139 mEq/L (ref 135–145)

## 2019-10-26 LAB — HEMOGLOBIN A1C: Hgb A1c MFr Bld: 7.7 % — ABNORMAL HIGH (ref 4.6–6.5)

## 2019-10-26 NOTE — Progress Notes (Signed)
Subjective:  Patient ID: Renee Mann, female    DOB: 08/13/1956  Age: 64 y.o. MRN: [id number]  CC: No chief complaint on file.   HPI Renee Mann presents for hip and back pain (not better) - starting PT F/u HTN, DM  Outpatient Medications Prior to Visit  Medication Sig Dispense Refill  . amLODipine (NORVASC) 5 MG tablet Take 1 tablet by mouth once daily 30 tablet 11  . Blood Glucose Monitoring Suppl (ACCU-CHEK AVIVA PLUS) w/Device KIT Use to check blood sugar daily. DX E11.9 1 kit 0  . clotrimazole-betamethasone (LOTRISONE) cream Apply 1 application topically 2 (two) times daily. X 1 wk only 30 g 0  . DULoxetine (CYMBALTA) 30 MG capsule Take 1 capsule (30 mg total) by mouth daily. 30 capsule 3  . EPINEPHrine (EPIPEN 2-PAK) 0.3 mg/0.3 mL IJ SOAJ injection Use as directed for severe allergic reaction 1 each 1  . EQ ALLERGY RELIEF 10 MG tablet Take 1 tablet by mouth once daily 100 tablet 3  . famotidine (PEPCID) 40 MG tablet Take 1 tablet (40 mg total) by mouth daily. 90 tablet 3  . gabapentin (NEURONTIN) 300 MG capsule Take 1 capsule (300 mg total) by mouth 3 (three) times daily. 90 capsule 3  . glucose blood (ACCU-CHEK AVIVA PLUS) test strip Use to check blood sugar daily. DX E11.9 100 each 12  . glucose blood (ONE TOUCH ULTRA TEST) test strip 1 each by Other route daily. Use to check blood sugars once a day 100 each 12  . glucose blood test strip Use 1 times a day PRN. DX: E11.9 100 each 3  . levocetirizine (XYZAL) 5 MG tablet Take 1 tablet twice daily 60 tablet 11  . metFORMIN (GLUCOPHAGE XR) 750 MG 24 hr tablet Take 1 tablet (750 mg total) by mouth daily with breakfast. 60 tablet 11  . omalizumab (XOLAIR) 150 MG injection INJECT 300MG SUBCUTANEOUSLY EVERY 28 DAYS (GIVEN AT MD  OFFICE) 2 each 11  . tiZANidine (ZANAFLEX) 4 MG tablet Take 1 tablet (4 mg total) by mouth every 8 (eight) hours as needed for muscle spasms. 30 tablet 1  . Triamcinolone Acetonide (TRIAMCINOLONE 0.1 % CREAM :  EUCERIN) CREA Apply 1 application topically 2 (two) times daily. 1 each 2  . methylPREDNISolone (MEDROL DOSEPAK) 4 MG TBPK tablet As directed 21 tablet 0   Facility-Administered Medications Prior to Visit  Medication Dose Route Frequency Provider Last Rate Last Admin  . omalizumab Arvid Right) injection 300 mg  300 mg Subcutaneous Q28 days [name], MD   300 mg at 10/10/19 1702    ROS: Review of Systems  Constitutional: Negative for activity change, appetite change, chills, fatigue and unexpected weight change.  HENT: Negative for congestion, mouth sores and sinus pressure.   Eyes: Negative for visual disturbance.  Respiratory: Negative for cough and chest tightness.   Gastrointestinal: Negative for abdominal pain and nausea.  Genitourinary: Negative for difficulty urinating, frequency and vaginal pain.  Musculoskeletal: Positive for arthralgias, back pain and gait problem.  Skin: Negative for pallor and rash.  Neurological: Negative for dizziness, tremors, weakness, numbness and headaches.  Psychiatric/Behavioral: Negative for confusion, sleep disturbance and suicidal ideas.    Objective:  BP (!) 144/82 (BP Location: Left Arm, Patient Position: Sitting, Cuff Size: Normal)   Pulse 94   Temp 98.4 F (36.9 C) (Oral)   Ht 4' 10"  (1.473 m)   Wt 154 lb (69.9 kg)   SpO2 97%   BMI 32.19 kg/m  BP Readings from Last 3 Encounters:  10/26/19 (!) 144/82  10/18/19 (!) 144/90  09/04/19 (!) 150/90    Wt Readings from Last 3 Encounters:  10/26/19 154 lb (69.9 kg)  10/18/19 155 lb (70.3 kg)  09/04/19 153 lb (69.4 kg)    Physical Exam Constitutional:      General: She is not in acute distress.    Appearance: She is well-developed.  HENT:     Head: Normocephalic.     Right Ear: External ear normal.     Left Ear: External ear normal.     Nose: Nose normal.  Eyes:     General:        Right eye: No discharge.        Left eye: No discharge.     Conjunctiva/sclera: Conjunctivae  normal.     Pupils: Pupils are equal, round, and reactive to light.  Neck:     Thyroid: No thyromegaly.     Vascular: No JVD.     Trachea: No tracheal deviation.  Cardiovascular:     Rate and Rhythm: Normal rate and regular rhythm.     Heart sounds: Normal heart sounds.  Pulmonary:     Effort: No respiratory distress.     Breath sounds: No stridor. No wheezing.  Abdominal:     General: Bowel sounds are normal. There is no distension.     Palpations: Abdomen is soft. There is no mass.     Tenderness: There is no abdominal tenderness. There is no guarding or rebound.  Musculoskeletal:        General: Tenderness present.     Cervical back: Normal range of motion and neck supple.  Lymphadenopathy:     Cervical: No cervical adenopathy.  Skin:    Findings: No erythema or rash.  Neurological:     Mental Status: She is oriented to person, place, and time.     Cranial Nerves: No cranial nerve deficit.     Motor: No abnormal muscle tone.     Coordination: Coordination normal.     Deep Tendon Reflexes: Reflexes normal.  Psychiatric:        Behavior: Behavior normal.        Thought Content: Thought content normal.        Judgment: Judgment normal.   LS, hips - tender  Lab Results  Component Value Date   WBC 8.0 10/10/2018   HGB 13.4 10/10/2018   HCT 41.3 10/10/2018   PLT 466.0 (H) 10/10/2018   GLUCOSE 118 (H) 07/12/2019   CHOL 165 10/10/2018   TRIG 71.0 10/10/2018   HDL 36.40 (L) 10/10/2018   LDLCALC 115 (H) 10/10/2018   ALT 24 10/10/2018   AST 17 10/10/2018   NA 141 07/12/2019   K 3.8 07/12/2019   CL 103 07/12/2019   CREATININE 0.74 07/12/2019   BUN 10 07/12/2019   CO2 28 07/12/2019   TSH 2.20 10/10/2018   HGBA1C 7.8 (H) 07/12/2019   MICROALBUR <0.7 10/10/2018    DG Lumbar Spine 2-3 Views  Result Date: 04/11/2019 CLINICAL DATA:  Low back pain with right lower extremity radicular some EXAM: LUMBAR SPINE - 2-3 VIEW COMPARISON:  None. FINDINGS: Frontal, lateral, and  spot lumbosacral lateral images were obtained. There are 5 non-rib-bearing lumbar type vertebral bodies. There is no fracture or spondylolisthesis. There is mild disc space narrowing at L3-4, L4-5, and L5-S1. There is facet arthropathy at L4-5 and L5-S1 bilaterally. No erosive changes. IMPRESSION: Lower lumbar arthropathy.  No fracture or  spondylolisthesis. Electronically Signed   By: Lowella Grip III M.D.   On: 04/11/2019 08:50    Assessment & Plan:   There are no diagnoses linked to this encounter.   No orders of the defined types were placed in this encounter.    Follow-up: No follow-ups on file.  Walker Kehr, MD

## 2019-10-26 NOTE — Addendum Note (Signed)
Addended by: Cresenciano Lick on: 10/26/2019 11:49 AM   Modules accepted: Orders

## 2019-10-26 NOTE — Assessment & Plan Note (Signed)
Starting PT 

## 2019-10-26 NOTE — Assessment & Plan Note (Signed)
nl BP at home per pt

## 2019-10-26 NOTE — Assessment & Plan Note (Signed)
Metformin po Labs

## 2019-10-27 ENCOUNTER — Ambulatory Visit: Payer: 59 | Attending: Family Medicine

## 2019-10-27 DIAGNOSIS — M25551 Pain in right hip: Secondary | ICD-10-CM | POA: Insufficient documentation

## 2019-10-27 DIAGNOSIS — M6281 Muscle weakness (generalized): Secondary | ICD-10-CM | POA: Insufficient documentation

## 2019-10-27 DIAGNOSIS — M25651 Stiffness of right hip, not elsewhere classified: Secondary | ICD-10-CM

## 2019-10-27 DIAGNOSIS — R262 Difficulty in walking, not elsewhere classified: Secondary | ICD-10-CM | POA: Diagnosis present

## 2019-10-27 NOTE — Therapy (Signed)
[place], [place], [address] Phone: [phone]   Fax:  [phone]  Physical Therapy Evaluation  Patient Details  Name: Renee Mann MRN: [id number] Date of Birth: 07-26-1956 Referring Provider (PT): Hulan Saas, MD   Encounter Date: 10/27/2019  PT End of Session - 10/27/19 1623    Visit Number  1    Number of Visits  17    Date for PT Re-Evaluation  12/22/19    Authorization Type  [employer]    PT Start Time  0800    PT Stop Time  0845    PT Time Calculation (min)  45 min    Activity Tolerance  Patient tolerated treatment well    Behavior During Therapy  [facility] for tasks assessed/performed       Past Medical History:  Diagnosis Date  . Diabetes mellitus 2008   type II  . Gastroesophageal reflux disease   . Hives   . Hypertension   . Osteoarthritis   . 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 ABDOMINAL HYSTERECTOMY  1987  . burritis surgery Left 1998   hip    There were no vitals filed for this visit.   Subjective Assessment - 10/27/19 0809    Subjective  Pt reports intermittent R hip pain for 5+ months    Pertinent History  Pt denies a precipating event    Limitations  Sitting;Lifting;Standing    How long can you sit comfortably?  1-2 hours    How long can you stand comfortably?  30-60 mins    How long can you walk comfortably?  15-30 mins    Patient Stated Goals  80-90% decrease in pain    Currently in Pain?  Yes    Pain Score  7     Pain Location  Hip    Pain Orientation  Right    Pain Descriptors / Indicators  Aching;Sharp    Pain Type  Chronic pain    Pain Radiating Towards  NA    Pain Onset  Other (comment)   5+ mmonths   Pain Frequency  Intermittent    Aggravating Factors   prolonged sitting, standing and walking, sit to standing    Pain Relieving Factors  Rest, sitting, heat    Effect of Pain on Daily Activities  limits  tolerance to daily activities    Multiple Pain Sites  No         [facility] PT Assessment - 10/27/19 0001      Assessment   Medical Diagnosis  R hip pain    Referring Provider (PT)  Hulan Saas, MD    Onset Date/Surgical Date  05/25/19      Precautions   Precautions  None      Restrictions   Weight Bearing Restrictions  No      Balance Screen   Has the patient fallen in the past 6 months  No      [place] residence    Living Arrangements  Spouse/significant other    Type of [place] to enter   3 steps   Entrance Stairs-Rails  Right    [place]  One level      Prior Function   Level of [place]  Retired  Cognition   Overall Cognitive Status  Within Functional Limits for tasks assessed      Coordination   Gross Motor Movements are Fluid and Coordinated  Yes      Sit to Stand   Comments  5x STS 46.1sec      Posture/Postural Control   Posture/Postural Control  No significant limitations      AROM   Overall AROM   Due to pain    Overall AROM Comments  Decreased ROM for R hip flexion c add and ER reproducing R greater trochanter    AROM Assessment Site  --      Strength   Overall Strength  Deficits    Overall Strength Comments  Both hips 4/5      Palpation   SI assessment   ASIS and medial malleoli are equal    Palpation comment  Pt's pain is reproduced c palpation along the post/lat aspect of the greater trochanter      Ambulation/Gait   Gait Comments  2 min walking test 410ft                Objective measurements completed on examination: See above findings.      [facility] Adult PT Treatment/Exercise - 10/27/19 0001      Knee/Hip Exercises: Stretches   ITB Stretch  Right;5 reps;10 seconds    Other Knee/Hip Stretches  R KTC 10x             PT Education - 10/27/19 1620    Education Details  HEP; Walking tolerated distances for exercise;  Avoiding sitting with knees higher than hips in a closed pack position    Person(s) Educated  Patient    Methods  Explanation;Demonstration;Tactile cues;Verbal cues    Comprehension  Verbalized understanding;Returned demonstration;Verbal cues required;Tactile cues required       PT Short Term Goals - 10/27/19 1641      PT SHORT TERM GOAL #1   Title  Pt will be Ind in a HEP to address ROM, flexibility, strength to her LEs for improved R hip pain.    Baseline  Initial HEP started    Time  3    Period  Weeks    Status  New    Target Date  11/17/19        PT Long Term Goals - 10/27/19 1643      PT LONG TERM GOAL #1   Title  Pt will reports an 80% improvement in her R hip pain level per her stated goal.    Baseline  Intermittent r hip pain 7/10    Time  8    Period  Weeks    Status  New    Target Date  12/22/19      PT LONG TERM GOAL #2   Title  Pt will reports an improved tolerance to functional activities c a R hip pain range of 0-4/10    Baseline  7/10    Time  8    Period  Weeks    Status  New    Target Date  12/22/19      PT LONG TERM GOAL #3   Title  %x STS will improve by the [place] of 4.2 sec    Baseline  46.1 sec    Time  8    Period  Weeks    Status  New    Target Date  12/22/19      PT LONG TERM GOAL #4   Title  2 min walking test will improve the MDC of 70 ft    Baseline  456ft    Time  8    Period  Weeks    Status  New    Target Date  12/22/19      PT LONG TERM GOAL #5   Title  FOTO limitation score will improve to 47%    Baseline  limitation score of eval 64%    Time  8    Period  Weeks    Status  New    Target Date  12/22/19             Plan - 10/27/19 1625    Clinical Impression Statement  Pt presents with intermittent R hip pain with signs and symptoms consistent with greater trochanter bursitis. Palpation and ITB/tensor fasciae latae stretch reproduce pt's symptoms. Addditionally, pt has decreased strength of B hips and knees and  functional tests of 5xSTS and 2 min walking test were found below norms.    Personal Factors and Comorbidities  Comorbidity 2    Comorbidities  OA, diabetes    Examination-Activity Limitations  Squat;Lift;Stairs;Locomotion Level;Stand;Sit;Sleep    Examination-Participation Restrictions  Cleaning;Laundry;Yard Work    [employer]  Evolving/Moderate complexity    Clinical Decision Making  Moderate    Rehab Potential  Good    PT Frequency  2x / week    PT Duration  8 weeks    PT Treatment/Interventions  Cryotherapy;Moist Heat;Iontophoresis 4mg /ml Dexamethasone;Electrical Stimulation;Gait training;Stair training;Functional mobility training;Balance training;Therapeutic exercise;Therapeutic activities;Patient/family education;Manual techniques;Dry needling;Passive range of motion;Joint Manipulations    PT Next Visit Plan  Assess response to HEP; Progress ther ex and use modalities as indicated.    PT Home Exercise Plan  [id number]    Consulted and Agree with Plan of Care  Patient       Patient will benefit from skilled therapeutic intervention in order to improve the following deficits and impairments:  Pain, Decreased mobility, Decreased activity tolerance, Decreased endurance, Decreased range of motion, Decreased strength, Difficulty walking, Impaired flexibility, Decreased balance  Visit Diagnosis: Pain in right hip - Plan: PT plan of care cert/re-cert  Stiffness of right hip, not elsewhere classified - Plan: PT plan of care cert/re-cert  Difficulty in walking, not elsewhere classified - Plan: PT plan of care cert/re-cert  Muscle weakness (generalized) - Plan: PT plan of care cert/re-cert     Problem List Patient Active Problem List   Diagnosis Date Noted  . Neck muscle spasm 07/28/2019  . Lumbar radiculopathy, right 05/08/2019  . Sciatica 04/11/2019  . Tachycardia 10/10/2018  . Hand cramps 04/11/2018  . Hand swelling 01/22/2017  . Rash 12/15/2016  .  Urticaria 08/04/2016  . Neoplasm of uncertain behavior of skin 04/17/2016  . Gout 03/20/2015  . Well adult exam 03/20/2015  . Greater trochanteric bursitis of right hip 05/17/2013  . Hip pain 01/06/2013  . GERD 07/16/2010  . OSTEOARTHRITIS 03/27/2008  . INSOMNIA, PERSISTENT 07/13/2007  . HYPERTENSION 07/13/2007  . Diabetes mellitus, type II [facility]) 06/20/2007    [place], [place], [address] Phone: [phone]   Fax:  [phone]  Name: Renee Mann MRN: [id number] Date of Birth: 19-Apr-1956   Gar Ponto MS, PT 10/27/19 5:17 PM

## 2019-11-05 ENCOUNTER — Other Ambulatory Visit: Payer: Self-pay | Admitting: Internal Medicine

## 2019-11-07 ENCOUNTER — Ambulatory Visit (INDEPENDENT_AMBULATORY_CARE_PROVIDER_SITE_OTHER): Payer: 59 | Admitting: *Deleted

## 2019-11-07 ENCOUNTER — Other Ambulatory Visit: Payer: Self-pay

## 2019-11-07 DIAGNOSIS — L509 Urticaria, unspecified: Secondary | ICD-10-CM

## 2019-11-07 DIAGNOSIS — L501 Idiopathic urticaria: Secondary | ICD-10-CM | POA: Diagnosis not present

## 2019-11-08 ENCOUNTER — Ambulatory Visit: Payer: 59

## 2019-11-08 DIAGNOSIS — M25651 Stiffness of right hip, not elsewhere classified: Secondary | ICD-10-CM

## 2019-11-08 DIAGNOSIS — M25551 Pain in right hip: Secondary | ICD-10-CM | POA: Diagnosis not present

## 2019-11-08 DIAGNOSIS — R262 Difficulty in walking, not elsewhere classified: Secondary | ICD-10-CM

## 2019-11-08 DIAGNOSIS — M6281 Muscle weakness (generalized): Secondary | ICD-10-CM

## 2019-11-08 NOTE — Therapy (Addendum)
[place], [place], [address] Phone: [phone]   Fax:  [phone]  Physical Therapy Treatment  Patient Details  Name: Renee Mann MRN: [id number] Date of Birth: 01-30-1956 Referring Provider (PT): Hulan Saas, MD   Encounter Date: 11/08/2019  [id number]  PT End of Session - 11/08/19 2214    Visit Number  2    Number of Visits  17    Date for PT Re-Evaluation  12/22/19    Authorization Type  UHC    PT Start Time  0749    PT Stop Time  0828    PT Time Calculation (min)  39 min    Activity Tolerance  Patient tolerated treatment well    Behavior During Therapy  [facility] for tasks assessed/performed       Past Medical History:  Diagnosis Date  . Diabetes mellitus 2008   type II  . Gastroesophageal reflux disease   . Hives   . Hypertension   . Osteoarthritis   . 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 ABDOMINAL HYSTERECTOMY  1987  . burritis surgery Left 1998   hip    There were no vitals filed for this visit.  Subjective Assessment - 11/08/19 0801    Subjective  Pt reports her R hip pain is improving. Pt rates a 6/10 this AM. Overall, pt reports her R hip pain is better, and that she is managing the pain with ex. and avoiding painful positions.                               PT Education - 11/08/19 2158    Education Details  HEP; Review of not sitting with knees higher than hips.    Person(s) Educated  Patient    Methods  Explanation;Demonstration    Comprehension  Verbalized understanding       PT Short Term Goals - 10/27/19 1641      PT SHORT TERM GOAL #1   Title  Pt will be Ind in a HEP to address ROM, flexibility, strength to her LEs for improved R hip pain.    Baseline  Initial HEP started    Time  3    Period  Weeks    Status  New    Target Date  11/17/19        PT Long Term Goals - 10/27/19  1643      PT LONG TERM GOAL #1   Title  Pt will reports an 80% improvement in her R hip pain level per her stated goal.    Baseline  Intermittent r hip pain 7/10    Time  8    Period  Weeks    Status  New    Target Date  12/22/19      PT LONG TERM GOAL #2   Title  Pt will reports an improved tolerance to functional activities c a R hip pain range of 0-4/10    Baseline  7/10    Time  8    Period  Weeks    Status  New    Target Date  12/22/19      PT LONG TERM GOAL #3   Title  %x STS will improve by the MDC of 4.2 sec    Baseline  46.1 sec    Time  8    Period  Weeks    Status  New    Target Date  12/22/19      PT LONG TERM GOAL #4   Title  2 min walking test will improve the MDC of 70 ft    Baseline  479ft    Time  8    Period  Weeks    Status  New    Target Date  12/22/19      PT LONG TERM GOAL #5   Title  FOTO limitation score will improve to 47%    Baseline  limitation score of eval 64%    Time  8    Period  Weeks    Status  New    Target Date  12/22/19            Plan - 11/08/19 2204    Clinical Impression Statement  Pt's subjective response indicates the pt is having overall less pain. Pt reports she is completing her HEP. Additional LE flexibity exs were added today.    PT Treatment/Interventions  Cryotherapy;Moist Heat;Iontophoresis 4mg /ml Dexamethasone;Electrical Stimulation;Gait training;Stair training;Functional mobility training;Balance training;Therapeutic exercise;Therapeutic activities;Patient/family education;Manual techniques;Dry needling;Passive range of motion;Joint Manipulations    PT Next Visit Plan  Assess response to HEP. Assess SI jt. Modalities as needed.    PT Home Exercise Plan  [id number]. Seated hamstring and supine figure 4 hip ER stretches were added.       Patient will benefit from skilled therapeutic intervention in order to improve the following deficits and impairments:  Pain, Decreased mobility, Decreased activity tolerance,  Decreased endurance, Decreased range of motion, Decreased strength, Difficulty walking, Impaired flexibility, Decreased balance  Visit Diagnosis: Pain in right hip  Stiffness of right hip, not elsewhere classified  Muscle weakness (generalized)  Difficulty in walking, not elsewhere classified     Problem List Patient Active Problem List   Diagnosis Date Noted  . Neck muscle spasm 07/28/2019  . Lumbar radiculopathy, right 05/08/2019  . Sciatica 04/11/2019  . Tachycardia 10/10/2018  . Hand cramps 04/11/2018  . Hand swelling 01/22/2017  . Rash 12/15/2016  . Urticaria 08/04/2016  . Neoplasm of uncertain behavior of skin 04/17/2016  . Gout 03/20/2015  . Well adult exam 03/20/2015  . Greater trochanteric bursitis of right hip 05/17/2013  . Hip pain 01/06/2013  . GERD 07/16/2010  . OSTEOARTHRITIS 03/27/2008  . INSOMNIA, PERSISTENT 07/13/2007  . HYPERTENSION 07/13/2007  . Diabetes mellitus, type II ([place]) 06/20/2007    Gar Ponto MS, PT 11/09/19 1:13 PM  [place], [place], [address] Phone: [phone]   Fax:  [phone]  Name: Renee Mann MRN: [id number] Date of Birth: [birth year]

## 2019-11-10 ENCOUNTER — Other Ambulatory Visit: Payer: Self-pay

## 2019-11-10 ENCOUNTER — Ambulatory Visit: Payer: 59

## 2019-11-10 DIAGNOSIS — R262 Difficulty in walking, not elsewhere classified: Secondary | ICD-10-CM

## 2019-11-10 DIAGNOSIS — M25651 Stiffness of right hip, not elsewhere classified: Secondary | ICD-10-CM

## 2019-11-10 DIAGNOSIS — M25551 Pain in right hip: Secondary | ICD-10-CM

## 2019-11-10 DIAGNOSIS — M6281 Muscle weakness (generalized): Secondary | ICD-10-CM

## 2019-11-10 NOTE — Therapy (Signed)
[place], [place], [address] Phone: [phone]   Fax:  [phone]  Physical Therapy Treatment  Patient Details  Name: Renee Mann MRN: [id number] Date of Birth: September 01, 1955 Referring Provider (PT): Hulan Saas, MD   Encounter Date: 11/10/2019  PT End of Session - 11/10/19 1041    Visit Number  3    Number of Visits  17    Date for PT Re-Evaluation  12/22/19    Authorization Type  UHC    PT Start Time  0748    PT Stop Time  0832    PT Time Calculation (min)  44 min    Activity Tolerance  Patient tolerated treatment well    Behavior During Therapy  [facility] for tasks assessed/performed       Past Medical History:  Diagnosis Date  . Diabetes mellitus 2008   type II  . Gastroesophageal reflux disease   . Hives   . Hypertension   . Osteoarthritis   . Palsy (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 ABDOMINAL HYSTERECTOMY  1987  . burritis surgery Left 1998   hip    There were no vitals filed for this visit.  Subjective Assessment - 11/10/19 0752    Subjective  Pt reports she is not hurting today and her pain has been lower at a 5/10 level and intermittent.                       [place] Adult PT Treatment/Exercise - 11/10/19 0001      Exercises   Exercises  Lumbar;Knee/Hip      Lumbar Exercises: Standing   Scapular Retraction  Strengthening;10 reps   3 sets   Shoulder Extension  Strengthening;10 reps   3 sets     Lumbar Exercises: Supine   Pelvic Tilt  10 reps   3 sec; 2sets   Clam  --   2 sets   Bridge  10 reps;2 seconds   2 sets     Lumbar Exercises: Sidelying   Clam  10 reps;Right;2 seconds      Knee/Hip Exercises: Stretches   Passive Hamstring Stretch  3 reps;Right;10 seconds    ITB Stretch  Right;3 reps    Piriformis Stretch  Right;3 reps    Other Knee/Hip Stretches  R KTC 10x    Other Knee/Hip Stretches   Figure 4, ER stretch 3 reps; 20 sec hold      Knee/Hip Exercises: Aerobic   Nustep  5 mins; level 1      Manual Therapy   Manual Therapy  Soft tissue mobilization    Soft tissue mobilization  DASTM: foam roller 10 mins.             PT Education - 11/10/19 0840    Education Details  Education re: initiated core strengthening exs as outlined in instructions.    Person(s) Educated  Patient    Methods  Handout;Verbal cues;Tactile cues;Demonstration;Explanation    Comprehension  Verbalized understanding;Returned demonstration;Verbal cues required;Tactile cues required;Need further instruction       PT Short Term Goals - 10/27/19 1641      PT SHORT TERM GOAL #1   Title  Pt will be Ind in a HEP to address ROM, flexibility, strength to her LEs for improved R hip pain.    Baseline  Initial HEP started    Time  3    Period  Weeks    Status  New    Target Date  11/17/19        PT Long Term Goals - 10/27/19 1643      PT LONG TERM GOAL #1   Title  Pt will reports an 80% improvement in her R hip pain level per her stated goal.    Baseline  Intermittent r hip pain 7/10    Time  8    Period  Weeks    Status  New    Target Date  12/22/19      PT LONG TERM GOAL #2   Title  Pt will reports an improved tolerance to functional activities c a R hip pain range of 0-4/10    Baseline  7/10    Time  8    Period  Weeks    Status  New    Target Date  12/22/19      PT LONG TERM GOAL #3   Title  %x STS will improve by the [place] of 4.2 sec    Baseline  46.1 sec    Time  8    Period  Weeks    Status  New    Target Date  12/22/19      PT LONG TERM GOAL #4   Title  2 min walking test will improve the MDC of 70 ft    Baseline  49ft    Time  8    Period  Weeks    Status  New    Target Date  12/22/19      PT LONG TERM GOAL #5   Title  FOTO limitation score will improve to 47%    Baseline  limitation score of eval 64%    Time  8    Period  Weeks    Status  New    Target Date   12/22/19              Patient will benefit from skilled therapeutic intervention in order to improve the following deficits and impairments:     Visit Diagnosis: Pain in right hip  Stiffness of right hip, not elsewhere classified  Muscle weakness (generalized)  Difficulty in walking, not elsewhere classified     Problem List Patient Active Problem List   Diagnosis Date Noted  . Neck muscle spasm 07/28/2019  . Lumbar radiculopathy, right 05/08/2019  . Sciatica 04/11/2019  . Tachycardia 10/10/2018  . Hand cramps 04/11/2018  . Hand swelling 01/22/2017  . Rash 12/15/2016  . Urticaria 08/04/2016  . Neoplasm of uncertain behavior of skin 04/17/2016  . Gout 03/20/2015  . Well adult exam 03/20/2015  . Greater trochanteric bursitis of right hip 05/17/2013  . Hip pain 01/06/2013  . GERD 07/16/2010  . OSTEOARTHRITIS 03/27/2008  . INSOMNIA, PERSISTENT 07/13/2007  . HYPERTENSION 07/13/2007  . Diabetes mellitus, type II ([place]) 06/20/2007    Gar Ponto MS, PT 11/10/19 1:28 PM  [place] [facility] [address] [place], [place], [address] Phone: [phone]   Fax:  [phone]  Name: SANVI OGUINN MRN: [id number] Date of Birth: Feb 04, 1956

## 2019-11-15 ENCOUNTER — Other Ambulatory Visit: Payer: Self-pay

## 2019-11-15 ENCOUNTER — Ambulatory Visit: Payer: 59

## 2019-11-15 DIAGNOSIS — M25651 Stiffness of right hip, not elsewhere classified: Secondary | ICD-10-CM

## 2019-11-15 DIAGNOSIS — R262 Difficulty in walking, not elsewhere classified: Secondary | ICD-10-CM

## 2019-11-15 DIAGNOSIS — M25551 Pain in right hip: Secondary | ICD-10-CM | POA: Diagnosis not present

## 2019-11-15 DIAGNOSIS — M6281 Muscle weakness (generalized): Secondary | ICD-10-CM

## 2019-11-15 NOTE — Patient Instructions (Signed)
Pt to add alt knee lifts and SLRs c abd contractions, and quadraped alt arm lifts and leg lifts to HEP.

## 2019-11-15 NOTE — Therapy (Signed)
[place], [place], [address] Phone: [phone]   Fax:  [phone]  Physical Therapy Treatment  Patient Details  Name: Renee Mann MRN: [id number] Date of Birth: [birth year] Referring Provider (PT): Hulan Saas, MD   Encounter Date: 11/15/2019  PT End of Session - 11/15/19 0840    Visit Number  4    Number of Visits  17    Date for PT Re-Evaluation  12/22/19    Authorization Type  UHC    PT Start Time  0750    PT Stop Time  0830    PT Time Calculation (min)  40 min    Activity Tolerance  Patient tolerated treatment well    Behavior During Therapy  [facility] for tasks assessed/performed       Past Medical History:  Diagnosis Date  . Diabetes mellitus 2008   type II  . Gastroesophageal reflux disease   . Hives   . Hypertension   . Osteoarthritis   . 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 ABDOMINAL HYSTERECTOMY  1987  . burritis surgery Left 1998   hip    There were no vitals filed for this visit.  Subjective Assessment - 11/15/19 0755    Subjective  Pt states she is doing well this AM with a low level of pain. Pt rates her r hip pain a 2/10.                       [place] Adult PT Treatment/Exercise - 11/15/19 0001      Exercises   Exercises  Lumbar;Knee/Hip      Lumbar Exercises: Standing   Scapular Retraction  --    Shoulder Extension  --      Lumbar Exercises: Supine   Pelvic Tilt  10 reps   3 sec; 2sets   Clam  --    Bent Knee Raise  10 reps   2 sets c abd contraction; B LEs   Bridge  10 reps;2 seconds   2 sets   Straight Leg Raise  10 reps;2 seconds;Limitations    Straight Leg Raises Limitations  B LEs c abd contractions      Lumbar Exercises: Sidelying   Clam  10 reps;2 seconds;Both      Lumbar Exercises: Quadruped   Single Arm Raise  Right;Left;2 seconds;10 reps    Single Arm Raises Limitations   alternating    Straight Leg Raise  20 reps;10 reps;2 seconds    Straight Leg Raises Limitations  Both alternating      Knee/Hip Exercises: Stretches   Passive Hamstring Stretch  3 reps;Right;10 seconds    ITB Stretch  Both;2 reps;20 seconds    Piriformis Stretch  Both;1 rep;20 seconds    Other Knee/Hip Stretches  B KTC 2x; 20sec    Other Knee/Hip Stretches  B;Figure 4, ER stretch 2 reps; 20 sec hold      Knee/Hip Exercises: Aerobic   Nustep  5 mins; level 1      Manual Therapy   Manual Therapy  Soft tissue mobilization    Soft tissue mobilization  DASTM: foam roller 10 mins.               PT Short Term Goals - 10/27/19 1641      PT SHORT TERM GOAL #1   Title  Pt will be Ind in  a HEP to address ROM, flexibility, strength to her LEs for improved R hip pain.    Baseline  Initial HEP started    Time  3    Period  Weeks    Status  New    Target Date  11/17/19        PT Long Term Goals - 10/27/19 1643      PT LONG TERM GOAL #1   Title  Pt will reports an 80% improvement in her R hip pain level per her stated goal.    Baseline  Intermittent r hip pain 7/10    Time  8    Period  Weeks    Status  New    Target Date  12/22/19      PT LONG TERM GOAL #2   Title  Pt will reports an improved tolerance to functional activities c a R hip pain range of 0-4/10    Baseline  7/10    Time  8    Period  Weeks    Status  New    Target Date  12/22/19      PT LONG TERM GOAL #3   Title  %x STS will improve by the [place] of 4.2 sec    Baseline  46.1 sec    Time  8    Period  Weeks    Status  New    Target Date  12/22/19      PT LONG TERM GOAL #4   Title  2 min walking test will improve the MDC of 70 ft    Baseline  479ft    Time  8    Period  Weeks    Status  New    Target Date  12/22/19      PT LONG TERM GOAL #5   Title  FOTO limitation score will improve to 47%    Baseline  limitation score of eval 64%    Time  8    Period  Weeks    Status  New    Target Date   12/22/19            Plan - 11/15/19 1204    Clinical Impression Statement  R hip pain continues to be improved with daily activities. Core stability was decreased with quadraped exs.    PT Treatment/Interventions  Cryotherapy;Moist Heat;Iontophoresis 4mg /ml Dexamethasone;Electrical Stimulation;Gait training;Stair training;Functional mobility training;Balance training;Therapeutic exercise;Therapeutic activities;Patient/family education;Manual techniques;Dry needling;Passive range of motion;Joint Manipulations    PT Next Visit Plan  Assess response to added ther ex progressing abd and back stability and strengthening exs.    PT Home Exercise Plan  Added alt knee lifts and SLRs c abd contractions, and quadraped alt arm lifts and leg lifts.       Patient will benefit from skilled therapeutic intervention in order to improve the following deficits and impairments:  Pain, Decreased mobility, Decreased activity tolerance, Decreased endurance, Decreased range of motion, Decreased strength, Difficulty walking, Impaired flexibility, Decreased balance  Visit Diagnosis: Pain in right hip  Stiffness of right hip, not elsewhere classified  Muscle weakness (generalized)  Difficulty in walking, not elsewhere classified     Problem List Patient Active Problem List   Diagnosis Date Noted  . Neck muscle spasm 07/28/2019  . Lumbar radiculopathy, right 05/08/2019  . Sciatica 04/11/2019  . Tachycardia 10/10/2018  . Hand cramps 04/11/2018  . Hand swelling 01/22/2017  . Rash 12/15/2016  . Urticaria 08/04/2016  . Neoplasm of uncertain behavior of skin 04/17/2016  .  Gout 03/20/2015  . Well adult exam 03/20/2015  . Greater trochanteric bursitis of right hip 05/17/2013  . Hip pain 01/06/2013  . GERD 07/16/2010  . OSTEOARTHRITIS 03/27/2008  . INSOMNIA, PERSISTENT 07/13/2007  . HYPERTENSION 07/13/2007  . Diabetes mellitus, type II ([place]) 06/20/2007    Gar Ponto 11/15/2019, 12:16 PM  [facility] [address] [place], [place], [address] Phone: [phone]   Fax:  [phone]  Name: Renee Mann MRN: [id number] Date of Birth: 07-13-1956

## 2019-11-17 ENCOUNTER — Other Ambulatory Visit: Payer: Self-pay

## 2019-11-17 ENCOUNTER — Ambulatory Visit: Payer: 59

## 2019-11-17 DIAGNOSIS — M6281 Muscle weakness (generalized): Secondary | ICD-10-CM

## 2019-11-17 DIAGNOSIS — M25551 Pain in right hip: Secondary | ICD-10-CM

## 2019-11-17 DIAGNOSIS — M25651 Stiffness of right hip, not elsewhere classified: Secondary | ICD-10-CM

## 2019-11-17 DIAGNOSIS — R262 Difficulty in walking, not elsewhere classified: Secondary | ICD-10-CM

## 2019-11-17 NOTE — Therapy (Signed)
[place], [place], [address] Phone: [phone]   Fax:  [phone]  Physical Therapy Treatment  Patient Details  Name: Renee Mann MRN: [id number] Date of Birth: [birth year] Referring Provider (PT): Hulan Saas, MD   Encounter Date: 11/17/2019  PT End of Session - 11/17/19 0848    Visit Number  5    Number of Visits  17    Date for PT Re-Evaluation  12/22/19    Authorization Type  UHC    PT Start Time  0748    PT Stop Time  0838    PT Time Calculation (min)  50 min    Activity Tolerance  Patient tolerated treatment well    Behavior During Therapy  [facility] for tasks assessed/performed       Past Medical History:  Diagnosis Date  . Diabetes mellitus 2008   type II  . Gastroesophageal reflux disease   . Hives   . Hypertension   . Osteoarthritis   . 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 ABDOMINAL HYSTERECTOMY  1987  . burritis surgery Left 1998   hip    There were no vitals filed for this visit.  Subjective Assessment - 11/17/19 0752    Subjective  Pt reports Wed evening her R low back, hip and lateral LE to knee strated bothering her significantly. Today, she rates it a 8/10. Pt states she experiences this pain occasionally. She did experience some N/T of her R foot yesterday. Pt rates her L hip  a 3/10.                       [facility] Adult PT Treatment/Exercise - 11/17/19 0001      Exercises   Exercises  Lumbar;Knee/Hip      Lumbar Exercises: Supine   Other Supine Lumbar Exercises  Assessment of directional preference 15 mins.       Knee/Hip Exercises: Stretches   Piriformis Stretch  Left;3 reps;10 seconds    Other Knee/Hip Stretches  L/R KTC 10x; 20sec    Other Knee/Hip Stretches  L/R, Neural glide 10x             PT Education - 11/17/19 0844    Education Details  Re: positions of comfort c sitting and  sleeping; understanding pain behavior-centralization vs. peripheralization; adjusting HEP    Person(s) Educated  Patient    Methods  Explanation;Demonstration;Tactile cues;Verbal cues    Comprehension  Verbalized understanding;Returned demonstration;Verbal cues required;Tactile cues required;Need further instruction       PT Short Term Goals - 10/27/19 1641      PT SHORT TERM GOAL #1   Title  Pt will be Ind in a HEP to address ROM, flexibility, strength to her LEs for improved R hip pain.    Baseline  Initial HEP started    Time  3    Period  Weeks    Status  New    Target Date  11/17/19        PT Long Term Goals - 10/27/19 1643      PT LONG TERM GOAL #1   Title  Pt will reports an 80% improvement in her R hip pain level per her stated goal.    Baseline  Intermittent r hip pain 7/10    Time  8    Period  Weeks    Status  New    Target Date  12/22/19      PT LONG TERM GOAL #2   Title  Pt will reports an improved tolerance to functional activities c a R hip pain range of 0-4/10    Baseline  7/10    Time  8    Period  Weeks    Status  New    Target Date  12/22/19      PT LONG TERM GOAL #3   Title  %x STS will improve by the MDC of 4.2 sec    Baseline  46.1 sec    Time  8    Period  Weeks    Status  New    Target Date  12/22/19      PT LONG TERM GOAL #4   Title  2 min walking test will improve the MDC of 70 ft    Baseline  464ft    Time  8    Period  Weeks    Status  New    Target Date  12/22/19      PT LONG TERM GOAL #5   Title  FOTO limitation score will improve to 47%    Baseline  limitation score of eval 64%    Time  8    Period  Weeks    Status  New    Target Date  12/22/19            Plan - 11/17/19 0805    Clinical Impression Statement  Pt presented with development of R LBP, hip, and LE pain whcih is something she has experienced in the past occasionally. It started Wed evening. Assess directional preference for pain reduction. Prone and ext  increased the pt's pain, while flexion activities centralized R leg pain and reduce her R LBP and hip pain. Provided a HEP for her new development of pain. Following today's session. Pt reported her R LE pain was resolved and her LBP/hip pain had decreased to 5/10.    PT Treatment/Interventions  Cryotherapy;Moist Heat;Iontophoresis 4mg /ml Dexamethasone;Electrical Stimulation;Gait training;Stair training;Functional mobility training;Balance training;Therapeutic exercise;Therapeutic activities;Patient/family education;Manual techniques;Dry needling;Passive range of motion;Joint Manipulations    PT Next Visit Plan  Assess pt's new development of R LBP/hip pain/LE pain and pt's response to today's PT session and new HEP provided.    PT Home Exercise Plan  Pt is to limit her HEP to B neural glides, R/L KTC, and L piriformis stretch.       Patient will benefit from skilled therapeutic intervention in order to improve the following deficits and impairments:  Pain, Decreased mobility, Decreased activity tolerance, Decreased endurance, Decreased range of motion, Decreased strength, Difficulty walking, Impaired flexibility, Decreased balance  Visit Diagnosis: Pain in right hip  Stiffness of right hip, not elsewhere classified  Muscle weakness (generalized)  Difficulty in walking, not elsewhere classified     Problem List Patient Active Problem List   Diagnosis Date Noted  . Neck muscle spasm 07/28/2019  . Lumbar radiculopathy, right 05/08/2019  . Sciatica 04/11/2019  . Tachycardia 10/10/2018  . Hand cramps 04/11/2018  . Hand swelling 01/22/2017  . Rash 12/15/2016  . Urticaria 08/04/2016  . Neoplasm of uncertain behavior of skin 04/17/2016  . Gout 03/20/2015  . Well adult exam 03/20/2015  . Greater trochanteric bursitis of right hip 05/17/2013  . Hip pain 01/06/2013  . GERD 07/16/2010  . OSTEOARTHRITIS 03/27/2008  . INSOMNIA, PERSISTENT 07/13/2007  . HYPERTENSION 07/13/2007  . Diabetes  mellitus, type II ([place]) 06/20/2007  Gar Ponto MS, PT 11/17/19 9:11 AM  [facility] [address] [place], [place], [address] Phone: [phone]   Fax:  [phone]  Name: Renee Mann MRN: [id number] Date of Birth: September 09, 1955

## 2019-11-17 NOTE — Patient Instructions (Addendum)
Neural glides: siiting c knee ext and DF. Pt is to limit her HEP to B neural glides, R/L KTC, and L piriformis stretch.

## 2019-11-20 ENCOUNTER — Ambulatory Visit: Payer: 59

## 2019-11-20 ENCOUNTER — Other Ambulatory Visit: Payer: Self-pay

## 2019-11-20 DIAGNOSIS — M6281 Muscle weakness (generalized): Secondary | ICD-10-CM

## 2019-11-20 DIAGNOSIS — M25551 Pain in right hip: Secondary | ICD-10-CM

## 2019-11-20 DIAGNOSIS — R262 Difficulty in walking, not elsewhere classified: Secondary | ICD-10-CM

## 2019-11-20 DIAGNOSIS — M25651 Stiffness of right hip, not elsewhere classified: Secondary | ICD-10-CM

## 2019-11-20 NOTE — Therapy (Addendum)
[place], [place], [address] Phone: [phone]   Fax:  [phone]  Physical Therapy Treatment/DIscharge Summary  Patient Details  Name: Renee Mann MRN: [id number] Date of Birth: 05-06-1956 Referring Provider (PT): Hulan Saas, MD   Encounter Date: 11/20/2019  PT End of Session - 11/20/19 0906    Visit Number  6    Number of Visits  17    Date for PT Re-Evaluation  12/22/19    PT Start Time  8588    PT Stop Time  0832    PT Time Calculation (min)  45 min    Activity Tolerance  Patient tolerated treatment well    Behavior During Therapy  [facility] for tasks assessed/performed       Past Medical History:  Diagnosis Date  . Diabetes mellitus 2008   type II  . Gastroesophageal reflux disease   . Hives   . Hypertension   . Osteoarthritis   . 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 ABDOMINAL HYSTERECTOMY  1987  . burritis surgery Left 1998   hip    There were no vitals filed for this visit.  Subjective Assessment - 11/20/19 0752    Subjective  Pt reports her back and R hip pain are better. Over the weekend she had occasional R LE pain. Today, rates pain a 5/10 s  L LE pain.                       [facility] Adult PT Treatment/Exercise - 11/20/19 0001      Exercises   Exercises  Knee/Hip;Lumbar      Lumbar Exercises: Stretches   Lower Trunk Rotation Limitations  10 reps, L less than R      Lumbar Exercises: Standing   Scapular Retraction  Strengthening;10 reps   3 sets   Shoulder Extension  Strengthening;10 reps   3 sets     Lumbar Exercises: Supine   Pelvic Tilt  10 reps   3 sec; 2sets   Bent Knee Raise  10 reps   2 sets c abd contraction; B LEs   Bridge  10 reps;2 seconds   2 sets   Straight Leg Raise  10 reps;2 seconds;Limitations    Straight Leg Raises Limitations  B LEs c abs contractions      Lumbar Exercises:  Sidelying   Clam  10 reps;2 seconds;Both      Knee/Hip Exercises: Stretches   ITB Stretch  Both;2 reps;20 seconds    Piriformis Stretch  Both;3 reps;10 seconds    Other Knee/Hip Stretches  L/R KTC 10x; 20sec    Other Knee/Hip Stretches  ; knee ext, DF, neck flex      Knee/Hip Exercises: Aerobic   Nustep  5 mins; level 3      Manual Therapy   Manual Therapy  Soft tissue mobilization    Soft tissue mobilization  DASTM: foam roller 10 mins.             PT Education - 11/20/19 0905    Education Details  Pt was returned to full ther ex program for ROM and strengthening with the exception of quadraped exs.    Person(s) Educated  Patient    Methods  Explanation;Demonstration    Comprehension  Verbalized understanding;Returned demonstration       PT Short Term Goals - 11/20/19 [phone]  PT SHORT TERM GOAL #1   Title  Met-Pt is Ind c a HEP to address ROM, flexibility, and strength of her LEs and core.        PT Long Term Goals - 10/27/19 1643      PT LONG TERM GOAL #1   Title  Pt will reports an 80% improvement in her R hip pain level per her stated goal.    Baseline  Intermittent r hip pain 7/10    Time  8    Period  Weeks    Status  New    Target Date  12/22/19      PT LONG TERM GOAL #2   Title  Pt will reports an improved tolerance to functional activities c a R hip pain range of 0-4/10    Baseline  7/10    Time  8    Period  Weeks    Status  New    Target Date  12/22/19      PT LONG TERM GOAL #3   Title  %x STS will improve by the [place] of 4.2 sec    Baseline  46.1 sec    Time  8    Period  Weeks    Status  New    Target Date  12/22/19      PT LONG TERM GOAL #4   Title  2 min walking test will improve the MDC of 70 ft    Baseline  42f    Time  8    Period  Weeks    Status  New    Target Date  12/22/19      PT LONG TERM GOAL #5   Title  FOTO limitation score will improve to 47%    Baseline  limitation score of eval 64%    Time  8    Period   Weeks    Status  New    Target Date  12/22/19            Plan - 11/20/19 0910    Clinical Impression Statement  Following an exacerbation of low back and R hip/LE pain, pt reports good improvement following last Friday's treatment session. Pt particiapted well with today's session to address LE and core flexibility and core strength. Pt rates apina 4/10 after PT session.    PT Treatment/Interventions  Cryotherapy;Moist Heat;Iontophoresis 450mml Dexamethasone;Electrical Stimulation;Gait training;Stair training;Functional mobility training;Balance training;Therapeutic exercise;Therapeutic activities;Patient/family education;Manual techniques;Dry needling;Passive range of motion;Joint Manipulations    PT Next Visit Plan  Possible iontophoresis for the R hip the next PT session.    PT Home Exercise Plan  Pt is to continue with established HEP       Patient will benefit from skilled therapeutic intervention in order to improve the following deficits and impairments:  Pain, Decreased mobility, Decreased activity tolerance, Decreased endurance, Decreased range of motion, Decreased strength, Difficulty walking, Impaired flexibility, Decreased balance  Visit Diagnosis: Pain in right hip  Stiffness of right hip, not elsewhere classified  Muscle weakness (generalized)  Difficulty in walking, not elsewhere classified     Problem List Patient Active Problem List   Diagnosis Date Noted  . Neck muscle spasm 07/28/2019  . Lumbar radiculopathy, right 05/08/2019  . Sciatica 04/11/2019  . Tachycardia 10/10/2018  . Hand cramps 04/11/2018  . Hand swelling 01/22/2017  . Rash 12/15/2016  . Urticaria 08/04/2016  . Neoplasm of uncertain behavior of skin 04/17/2016  . Gout 03/20/2015  . Well adult exam 03/20/2015  .  Greater trochanteric bursitis of right hip 05/17/2013  . Hip pain 01/06/2013  . GERD 07/16/2010  . OSTEOARTHRITIS 03/27/2008  . INSOMNIA, PERSISTENT 07/13/2007  . HYPERTENSION  07/13/2007  . Diabetes mellitus, type II ([place]) 06/20/2007   Gar Ponto MS, PT 09/16/20 4:19 PM  PHYSICAL THERAPY DISCHARGE SUMMARY  Visits from Start of Care: 6  Current functional level related to goals / functional outcomes: Pt DCed attending PT   Remaining deficits: Pt DCed attending PT   Education / Equipment: HEP was provided Plan: Patient agrees to discharge.  Patient goals were not met. Patient is being discharged due to not returning since the last visit.  ?????      [place], [place], [address] Phone: [phone]   Fax:  [phone]  Name: Renee Mann MRN: [id number] Date of Birth: 15-Jan-1956

## 2019-11-21 ENCOUNTER — Other Ambulatory Visit: Payer: Self-pay | Admitting: Internal Medicine

## 2019-11-22 ENCOUNTER — Ambulatory Visit: Payer: 59

## 2019-11-23 ENCOUNTER — Ambulatory Visit: Payer: 59 | Admitting: Family Medicine

## 2019-11-23 ENCOUNTER — Other Ambulatory Visit: Payer: Self-pay

## 2019-11-23 ENCOUNTER — Encounter: Payer: Self-pay | Admitting: Family Medicine

## 2019-11-23 VITALS — BP 130/90 | HR 88 | Ht <= 58 in | Wt 157.0 lb

## 2019-11-23 DIAGNOSIS — M25559 Pain in unspecified hip: Secondary | ICD-10-CM

## 2019-11-23 DIAGNOSIS — M549 Dorsalgia, unspecified: Secondary | ICD-10-CM | POA: Diagnosis not present

## 2019-11-23 DIAGNOSIS — G8929 Other chronic pain: Secondary | ICD-10-CM | POA: Diagnosis not present

## 2019-11-23 NOTE — Progress Notes (Signed)
[facility] [address] [place] Phone: [phone] Subjective:   I [name] am serving as a Education administrator for Dr. Hulan Saas.  This visit occurred during the SARS-CoV-2 public health emergency.  Safety protocols were in place, including screening questions prior to the visit, additional usage of staff PPE, and extensive cleaning of exam room while observing appropriate contact time as indicated for disinfecting solutions.   I'm seeing this patient by the request  of:  Plotnikov, [name], MD  CC: Low back and hip pain follow-up  ERX:[id number]   10/18/2019 Patient continues to have some difficulty with more of the right-sided lumbar radiculopathy.  Has known underlying arthritic changes.  Chronic problem with mild exacerbation and no significant improvement.  Discussed medical management with change patient Cymbalta to 30 mg.  Continue the gabapentin at the regular dosing at the moment.  Start formal physical therapy.  If no significant improvement in 4 to 5 weeks we will need to consider the possibility of MRI.  Social determinants of health including financial constraints, transportation issues, as well as patient having many comorbidities during pandemic.  11/23/2019 Mishka A Stelzner is a 64 y.o. female coming in with complaint of right hip pain. Patient states she is feeling ok. Pain is intermittent.  Patient still has pain that seems to stop her from activity.  Has done physical therapy but pain increases so much that she is unable to do it fully.  Continues the gabapentin.  Cymbalta helped initially and then worsened.   Started gabapentin in November 2020.  Has been in physical therapy for the last 3 months intermittently for right hip pain and back pain  Past Medical History:  Diagnosis Date  . Diabetes mellitus 2008   type II  . Gastroesophageal reflux disease   . Hives   . Hypertension   . Osteoarthritis   . Palsy ([place])    6th CN  palsy OS h/o   Past Surgical History:  Procedure Laterality Date  . ABDOMINAL HYSTERECTOMY  1987  . burritis surgery Left 1998   hip   Social History   Socioeconomic History  . Marital status: Married    Spouse name: Not on file  . Number of children: 1  . Years of education: Not on file  . Highest education level: Not on file  Occupational History  . 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 Smoking status: Former Smoker    Quit date: 08/24/1998    Years since quitting: 21.2  . Smokeless tobacco: Never Used  Substance and Sexual Activity  . Alcohol use: No    Alcohol/week: 0.0 standard drinks  . Drug use: No  . Sexual activity: Yes  Other Topics Concern  . Not on file  Social History Narrative   ** Merged History Encounter **       Social Determinants of Health   Financial Resource Strain:   . Difficulty of Paying Living Expenses:   Food Insecurity:   . Worried About Charity fundraiser in the Last Year:   . Arboriculturist in the Last Year:   Transportation Needs:   . Film/video editor (Medical):   [name] Lack of Transportation (Non-Medical):   Physical Activity:   . Days of Exercise per Week:   . Minutes of Exercise per Session:   Stress:   . Feeling of Stress :   Social Connections:   .  Frequency of Communication with Friends and Family:   . Frequency of Social Gatherings with Friends and Family:   . Attends Religious Services:   . Active Member of Clubs or Organizations:   . Attends Archivist Meetings:   [name] Marital Status:    Allergies  Allergen Reactions  . Codeine Hives  . Atenolol     REACTION: WEIRD FEELINGS  . Hydrochlorothiazide W-Triamterene     REACTION: H/A   Family History  Problem Relation Age of Onset  . Hypertension Mother   . Cancer Mother   . Stroke Mother   . Hypertension Sister   . Hypertension Other   . Colon cancer Neg Hx   . Esophageal cancer Neg Hx   . Stomach cancer Neg Hx     . Rectal cancer Neg Hx     Current Outpatient Medications (Endocrine & Metabolic):  .  metFORMIN (GLUCOPHAGE-XR) 750 MG 24 hr tablet, Take 1 tablet by mouth once daily with breakfast   Current Outpatient Medications (Cardiovascular):  .  amLODipine (NORVASC) 5 MG tablet, Take 1 tablet by mouth once daily .  EPINEPHrine (EPIPEN 2-PAK) 0.3 mg/0.3 mL IJ SOAJ injection, Use as directed for severe allergic reaction   Current Outpatient Medications (Respiratory):  Noelle Penner ALLERGY RELIEF 10 MG tablet, Take 1 tablet by mouth once daily .  levocetirizine (XYZAL) 5 MG tablet, Take 1 tablet twice daily .  omalizumab (XOLAIR) 150 MG injection, INJECT 300MG SUBCUTANEOUSLY EVERY 28 DAYS (GIVEN AT MD  OFFICE)  Current Facility-Administered Medications (Respiratory):  .  omalizumab Arvid Right) injection 300 mg      Current Outpatient Medications (Other):  .  Blood Glucose Monitoring Suppl (ACCU-CHEK AVIVA PLUS) w/Device KIT, Use to check blood sugar daily. DX E11.9 .  clotrimazole-betamethasone (LOTRISONE) cream, Apply 1 application topically 2 (two) times daily. X 1 wk only .  diclofenac (VOLTAREN) 0.1 % ophthalmic solution, 4 (four) times daily. .  DULoxetine (CYMBALTA) 30 MG capsule, Take 1 capsule (30 mg total) by mouth daily. .  famotidine (PEPCID) 40 MG tablet, Take 1 tablet (40 mg total) by mouth daily. [name]  gabapentin (NEURONTIN) 300 MG capsule, Take 1 capsule (300 mg total) by mouth 3 (three) times daily. [name]  glucose blood (ONE TOUCH ULTRA TEST) test strip, 1 each by Other route daily. Use to check blood sugars once a day .  glucose blood test strip, Use 1 times a day PRN. DX: E11.9 .  glucose blood test strip, USE TO CHECK BLOOD SUGAR DAILY .  tiZANidine (ZANAFLEX) 4 MG tablet, Take 1 tablet (4 mg total) by mouth every 8 (eight) hours as needed for muscle spasms. .  Triamcinolone Acetonide (TRIAMCINOLONE 0.1 % CREAM : EUCERIN) CREA, Apply 1 application topically 2 (two) times  daily.    Reviewed prior external information including notes and imaging from  primary care provider As well as notes that were available from care everywhere and other healthcare systems.  Past medical history, social, surgical and family history all reviewed in electronic medical record.  No pertanent information unless stated regarding to the chief complaint.   Review of Systems:  No headache, visual changes, nausea, vomiting, diarrhea, constipation, dizziness, abdominal pain, skin rash, fevers, chills, night sweats, weight loss, swollen lymph nodes, body aches, joint swelling, chest pain, shortness of breath, mood changes. POSITIVE muscle aches  Objective  Blood pressure 130/90, pulse 88, height 4' 10"  (1.473 m), weight 157 lb (71.2 kg), SpO2 98 %.   General: No  apparent distress alert and oriented x3 mood and affect normal, dressed appropriately.  HEENT: Pupils equal, extraocular movements intact  Respiratory: Patient's speak in full sentences and does not appear short of breath  Cardiovascular: No lower extremity edema, non tender, no erythema  Neuro: Cranial nerves II through XII are intact, neurovascularly intact in all extremities with 2+ DTRs and 2+ pulses.  Gait normal with good balance and coordination.  MSK:  Non tender with full range of motion and good stability and symmetric strength and tone of shoulders, elbows, wrist, hip, knee and ankles bilaterally.  Back Exam:  Inspection: Loss of lordosis Motion: Flexion 45 deg, Extension 45 deg, Side Bending to 45 deg bilaterally,  Rotation to 45 deg bilaterally  SLR laying: Positive at 30 degrees of forward flexion XSLR laying: Negative  Palpable tenderness: Tender to palpation of the paraspinal musculature on the right side.[name] FABER: Continued tightness on the right side. Sensory change: Gross sensation intact to all lumbar and sacral dermatomes.  Reflexes: 2+ at both patellar tendons, 2+ at achilles tendons, Babinski's  downgoing.  Strength at foot  Mild weakness of plantarflexion and dorsiflexion compared to contralateral side but deep tendon reflexes intact.   Impression and Recommendations:     This case required medical decision making of moderate complexity. The above documentation has been reviewed and is accurate and complete Lyndal Pulley, DO       Note: This dictation was prepared with Dragon dictation along with smaller phrase technology. Any transcriptional errors that result from this process are unintentional.

## 2019-11-23 NOTE — Patient Instructions (Signed)
If you haven't heard about scheduling MRI call [phone] [place] [facility] Continue Cymbalta I will talk to you about MRI results when I get them

## 2019-11-23 NOTE — Assessment & Plan Note (Signed)
Patient is continuing to have pain and potentially even worsening.  At this time patient has failed injection in the piriformis and greater trochanteric area, failed formal physical therapy, gabapentin and duloxetine.  I feel that this is been going on greater than 6 months.  Patient will need MRI and we will discuss the possibility of epidural injections.  Patient is agreement with the plan follow-up with me again 4 to 8 weeks

## 2019-11-29 ENCOUNTER — Ambulatory Visit: Payer: 59

## 2019-12-01 ENCOUNTER — Ambulatory Visit: Payer: 59

## 2019-12-05 ENCOUNTER — Other Ambulatory Visit: Payer: Self-pay

## 2019-12-05 ENCOUNTER — Ambulatory Visit (INDEPENDENT_AMBULATORY_CARE_PROVIDER_SITE_OTHER): Payer: 59

## 2019-12-05 DIAGNOSIS — L501 Idiopathic urticaria: Secondary | ICD-10-CM | POA: Diagnosis not present

## 2019-12-05 DIAGNOSIS — L509 Urticaria, unspecified: Secondary | ICD-10-CM

## 2019-12-22 ENCOUNTER — Other Ambulatory Visit: Payer: 59

## 2019-12-22 ENCOUNTER — Other Ambulatory Visit: Payer: Self-pay

## 2019-12-22 ENCOUNTER — Ambulatory Visit
Admission: RE | Admit: 2019-12-22 | Discharge: 2019-12-22 | Disposition: A | Discharge status: Home / Self Care | Payer: 59 | Source: Ambulatory Visit | Attending: Family Medicine | Admitting: Family Medicine

## 2019-12-22 DIAGNOSIS — G8929 Other chronic pain: Secondary | ICD-10-CM

## 2019-12-22 DIAGNOSIS — M549 Dorsalgia, unspecified: Secondary | ICD-10-CM

## 2019-12-25 ENCOUNTER — Other Ambulatory Visit: Payer: Self-pay

## 2019-12-25 ENCOUNTER — Telehealth: Payer: Self-pay | Admitting: Family Medicine

## 2019-12-25 DIAGNOSIS — M5416 Radiculopathy, lumbar region: Secondary | ICD-10-CM

## 2019-12-25 NOTE — Telephone Encounter (Signed)
Spoke with patient regarding epidural. Order send into [facility].

## 2019-12-25 NOTE — Telephone Encounter (Signed)
Patient called requesting a return call to discuss the MRI she had done and the message from Dr Tamala Julian.

## 2019-12-28 ENCOUNTER — Other Ambulatory Visit: Payer: Self-pay

## 2019-12-28 ENCOUNTER — Ambulatory Visit
Admission: RE | Admit: 2019-12-28 | Discharge: 2019-12-28 | Disposition: A | Discharge status: Home / Self Care | Payer: 59 | Source: Ambulatory Visit | Attending: Family Medicine | Admitting: Family Medicine

## 2019-12-28 DIAGNOSIS — M5416 Radiculopathy, lumbar region: Secondary | ICD-10-CM

## 2019-12-28 MED ORDER — METHYLPREDNISOLONE ACETATE 40 MG/ML INJ SUSP (RADIOLOG
120.0000 mg | Freq: Once | INTRAMUSCULAR | Status: AC
  Administered 2019-12-28: 120 mg via EPIDURAL

## 2019-12-28 MED ORDER — IOPAMIDOL (ISOVUE-M 200) INJECTION 41%
1.0000 mL | Freq: Once | INTRAMUSCULAR | Status: AC
  Administered 2019-12-28: 1 mL via EPIDURAL

## 2019-12-28 NOTE — Discharge Instructions (Signed)

## 2020-01-02 ENCOUNTER — Other Ambulatory Visit: Payer: Self-pay

## 2020-01-02 ENCOUNTER — Ambulatory Visit (INDEPENDENT_AMBULATORY_CARE_PROVIDER_SITE_OTHER): Payer: 59

## 2020-01-02 DIAGNOSIS — L501 Idiopathic urticaria: Secondary | ICD-10-CM | POA: Diagnosis not present

## 2020-01-02 DIAGNOSIS — L509 Urticaria, unspecified: Secondary | ICD-10-CM

## 2020-01-29 ENCOUNTER — Other Ambulatory Visit: Payer: Self-pay

## 2020-01-29 ENCOUNTER — Encounter: Payer: Self-pay | Admitting: Internal Medicine

## 2020-01-29 ENCOUNTER — Ambulatory Visit: Payer: 59 | Admitting: Internal Medicine

## 2020-01-29 ENCOUNTER — Ambulatory Visit (INDEPENDENT_AMBULATORY_CARE_PROVIDER_SITE_OTHER): Payer: 59

## 2020-01-29 VITALS — BP 150/98 | HR 93 | Temp 98.5°F | Ht <= 58 in | Wt 151.0 lb

## 2020-01-29 DIAGNOSIS — R202 Paresthesia of skin: Secondary | ICD-10-CM

## 2020-01-29 DIAGNOSIS — J069 Acute upper respiratory infection, unspecified: Secondary | ICD-10-CM

## 2020-01-29 DIAGNOSIS — E119 Type 2 diabetes mellitus without complications: Secondary | ICD-10-CM

## 2020-01-29 DIAGNOSIS — I10 Essential (primary) hypertension: Secondary | ICD-10-CM

## 2020-01-29 DIAGNOSIS — R5383 Other fatigue: Secondary | ICD-10-CM | POA: Insufficient documentation

## 2020-01-29 LAB — CBC WITH DIFFERENTIAL/PLATELET
Basophils Absolute: 0 10*3/uL (ref 0.0–0.1)
Basophils Relative: 0.7 % (ref 0.0–3.0)
Eosinophils Absolute: 0.1 10*3/uL (ref 0.0–0.7)
Eosinophils Relative: 1 % (ref 0.0–5.0)
HCT: 39.3 % (ref 36.0–46.0)
Hemoglobin: 13 g/dL (ref 12.0–15.0)
Lymphocytes Relative: 38 % (ref 12.0–46.0)
Lymphs Abs: 2.3 10*3/uL (ref 0.7–4.0)
MCHC: 33.1 g/dL (ref 30.0–36.0)
MCV: 89.4 fl (ref 78.0–100.0)
Monocytes Absolute: 0.6 10*3/uL (ref 0.1–1.0)
Monocytes Relative: 9.7 % (ref 3.0–12.0)
Neutro Abs: 3.1 10*3/uL (ref 1.4–7.7)
Neutrophils Relative %: 50.6 % (ref 43.0–77.0)
Platelets: 394 10*3/uL (ref 150.0–400.0)
RBC: 4.39 Mil/uL (ref 3.87–5.11)
RDW: 14.9 % (ref 11.5–15.5)
WBC: 6.2 10*3/uL (ref 4.0–10.5)

## 2020-01-29 LAB — URINALYSIS
Bilirubin Urine: NEGATIVE
Hgb urine dipstick: NEGATIVE
Ketones, ur: NEGATIVE
Leukocytes,Ua: NEGATIVE
Nitrite: NEGATIVE
Specific Gravity, Urine: 1.015 (ref 1.000–1.030)
Total Protein, Urine: NEGATIVE
Urine Glucose: NEGATIVE
Urobilinogen, UA: 0.2 (ref 0.0–1.0)
pH: 5.5 (ref 5.0–8.0)

## 2020-01-29 LAB — BASIC METABOLIC PANEL
BUN: 7 mg/dL (ref 6–23)
CO2: 29 mEq/L (ref 19–32)
Calcium: 9.4 mg/dL (ref 8.4–10.5)
Chloride: 101 mEq/L (ref 96–112)
Creatinine, Ser: 0.72 mg/dL (ref 0.40–1.20)
GFR: 98.23 mL/min (ref >60.00)
Glucose, Bld: 115 mg/dL — ABNORMAL HIGH (ref 70–99)
Potassium: 3.8 mEq/L (ref 3.5–5.1)
Sodium: 139 mEq/L (ref 135–145)

## 2020-01-29 LAB — TSH: TSH: 1.9 u[IU]/mL (ref 0.35–4.50)

## 2020-01-29 LAB — VITAMIN B12: Vitamin B-12: 289 pg/mL (ref 211–911)

## 2020-01-29 LAB — HEPATIC FUNCTION PANEL
ALT: 59 U/L — ABNORMAL HIGH (ref 0–35)
AST: 47 U/L — ABNORMAL HIGH (ref 0–37)
Albumin: 4.3 g/dL (ref 3.5–5.2)
Alkaline Phosphatase: 108 U/L (ref 39–117)
Bilirubin, Direct: 0.1 mg/dL (ref 0.0–0.3)
Total Bilirubin: 0.3 mg/dL (ref 0.2–1.2)
Total Protein: 7.6 g/dL (ref 6.0–8.3)

## 2020-01-29 LAB — CORTISOL: Cortisol, Plasma: 9.1 ug/dL

## 2020-01-29 LAB — HEMOGLOBIN A1C: Hgb A1c MFr Bld: 8 % — ABNORMAL HIGH (ref 4.6–6.5)

## 2020-01-29 MED ORDER — BENZONATATE 200 MG PO CAPS
200.0000 mg | ORAL_CAPSULE | Freq: Two times a day (BID) | ORAL | 1 refills | Status: DC | PRN
Start: 2020-01-29 — End: 2021-02-04

## 2020-01-29 NOTE — Assessment & Plan Note (Signed)
Labs Metformin 

## 2020-01-29 NOTE — Assessment & Plan Note (Signed)
New Labs, CXR RTC 2 wks

## 2020-01-29 NOTE — Progress Notes (Signed)
Subjective:  Patient ID: Renee Mann, female    DOB: Aug 13, 1956  Age: 64 y.o. MRN: [id number]  CC: No chief complaint on file.   HPI [name] presents for fatigue, dizziness, unsteady on her feet, "weak knees". [name] had a "cold". C/o cough F/u HTN   Outpatient Medications Prior to Visit  Medication Sig Dispense Refill  . amLODipine (NORVASC) 5 MG tablet Take 1 tablet by mouth once daily 30 tablet 11  . Blood Glucose Monitoring Suppl (ACCU-CHEK AVIVA PLUS) w/Device KIT Use to check blood sugar daily. DX E11.9 1 kit 0  . clotrimazole-betamethasone (LOTRISONE) cream Apply 1 application topically 2 (two) times daily. X 1 wk only 30 g 0  . diclofenac (VOLTAREN) 0.1 % ophthalmic solution 4 (four) times daily.    . DULoxetine (CYMBALTA) 30 MG capsule Take 1 capsule (30 mg total) by mouth daily. 30 capsule 3  . EPINEPHrine (EPIPEN 2-PAK) 0.3 mg/0.3 mL IJ SOAJ injection Use as directed for severe allergic reaction 1 each 1  . EQ ALLERGY RELIEF 10 MG tablet Take 1 tablet by mouth once daily 100 tablet 3  . famotidine (PEPCID) 40 MG tablet Take 1 tablet (40 mg total) by mouth daily. 90 tablet 3  . gabapentin (NEURONTIN) 300 MG capsule Take 1 capsule (300 mg total) by mouth 3 (three) times daily. 90 capsule 3  . glucose blood (ONE TOUCH ULTRA TEST) test strip 1 each by Other route daily. Use to check blood sugars once a day 100 each 12  . glucose blood test strip Use 1 times a day PRN. DX: E11.9 100 each 3  . glucose blood test strip USE TO CHECK BLOOD SUGAR DAILY 50 each 5  . levocetirizine (XYZAL) 5 MG tablet Take 1 tablet twice daily 60 tablet 11  . metFORMIN (GLUCOPHAGE-XR) 750 MG 24 hr tablet Take 1 tablet by mouth once daily with breakfast 90 tablet 3  . omalizumab (XOLAIR) 150 MG injection INJECT 300MG SUBCUTANEOUSLY EVERY 28 DAYS (GIVEN AT MD  OFFICE) 2 each 11  . tiZANidine (ZANAFLEX) 4 MG tablet Take 1 tablet (4 mg total) by mouth every 8 (eight) hours as needed for muscle spasms.  30 tablet 1  . Triamcinolone Acetonide (TRIAMCINOLONE 0.1 % CREAM : EUCERIN) CREA Apply 1 application topically 2 (two) times daily. 1 each 2   Facility-Administered Medications Prior to Visit  Medication Dose Route Frequency Provider Last Rate Last Admin  . omalizumab Arvid Right) injection 300 mg  300 mg Subcutaneous Q28 days [name], MD   300 mg at 01/02/20 1659    ROS: Review of Systems  Constitutional: Positive for fatigue. Negative for activity change, appetite change, chills and unexpected weight change.  HENT: Negative for congestion, mouth sores and sinus pressure.   Eyes: Negative for visual disturbance.  Respiratory: Negative for cough and chest tightness.   Gastrointestinal: Negative for abdominal distention, abdominal pain and nausea.  Genitourinary: Negative for difficulty urinating, flank pain, frequency, urgency and vaginal pain.  Musculoskeletal: Negative for back pain and gait problem.  Skin: Negative for pallor and rash.  Neurological: Positive for dizziness, weakness and light-headedness. Negative for tremors, numbness and headaches.  Psychiatric/Behavioral: Negative for confusion and sleep disturbance.    Objective:  BP (!) 150/98 (BP Location: Left Arm, Patient Position: Sitting, Cuff Size: Normal)   Pulse 93   Temp 98.5 F (36.9 C) (Oral)   Ht 4' 10"  (1.473 m)   Wt 151 lb (68.5 kg)   SpO2 98%  BMI 31.56 kg/m   BP Readings from Last 3 Encounters:  01/29/20 (!) 150/98  12/28/19 (!) 175/89  11/23/19 130/90    Wt Readings from Last 3 Encounters:  01/29/20 151 lb (68.5 kg)  11/23/19 157 lb (71.2 kg)  10/26/19 154 lb (69.9 kg)    Physical Exam Constitutional:      General: She is not in acute distress.    Appearance: She is well-developed.  HENT:     Head: Normocephalic.     Right Ear: External ear normal.     Left Ear: External ear normal.     Nose: Nose normal.  Eyes:     General:        Right eye: No discharge.        Left eye: No  discharge.     Conjunctiva/sclera: Conjunctivae normal.     Pupils: Pupils are equal, round, and reactive to light.  Neck:     Thyroid: No thyromegaly.     Vascular: No JVD.     Trachea: No tracheal deviation.  Cardiovascular:     Rate and Rhythm: Normal rate and regular rhythm.     Heart sounds: Normal heart sounds.  Pulmonary:     Effort: No respiratory distress.     Breath sounds: No stridor. No wheezing.  Abdominal:     General: Bowel sounds are normal. There is no distension.     Palpations: Abdomen is soft. There is no mass.     Tenderness: There is no abdominal tenderness. There is no guarding or rebound.  Musculoskeletal:        General: No tenderness.     Cervical back: Normal range of motion and neck supple.  Lymphadenopathy:     Cervical: No cervical adenopathy.  Skin:    Findings: No erythema or rash.  Neurological:     Cranial Nerves: No cranial nerve deficit.     Motor: No abnormal muscle tone.     Coordination: Coordination normal.     Deep Tendon Reflexes: Reflexes normal.  Psychiatric:        Behavior: Behavior normal.        Thought Content: Thought content normal.        Judgment: Judgment normal.   slight ataxia  Lab Results  Component Value Date   WBC 8.0 10/10/2018   HGB 13.4 10/10/2018   HCT 41.3 10/10/2018   PLT 466.0 (H) 10/10/2018   GLUCOSE 134 (H) 10/26/2019   CHOL 165 10/10/2018   TRIG 71.0 10/10/2018   HDL 36.40 (L) 10/10/2018   LDLCALC 115 (H) 10/10/2018   ALT 24 10/10/2018   AST 17 10/10/2018   NA 139 10/26/2019   K 4.1 10/26/2019   CL 101 10/26/2019   CREATININE 0.79 10/26/2019   BUN 11 10/26/2019   CO2 30 10/26/2019   TSH 2.20 10/10/2018   HGBA1C 7.7 (H) 10/26/2019   MICROALBUR <0.7 10/10/2018    DG INJECT DIAG/THERA/INC NEEDLE/CATH/PLC EPI/LUMB/SAC W/IMG  Result Date: 12/28/2019 CLINICAL DATA:  Lumbosacral spondylosis without myelopathy. Right greater than left low back pain. FLUOROSCOPY TIME:  Radiation Exposure Index (as  provided by the fluoroscopic device): 3.6 mGy Fluoroscopy Time:  20 seconds Number of Acquired Images:  0 PROCEDURE: The procedure, risks, benefits, and alternatives were explained to the patient. Questions regarding the procedure were encouraged and answered. The patient understands and consents to the procedure. LUMBAR EPIDURAL INJECTION: An interlaminar approach was performed on the right at L3-L4. The overlying skin was cleansed and anesthetized.  A 3.5 inch 20 gauge epidural needle was advanced using loss-of-resistance technique. DIAGNOSTIC EPIDURAL INJECTION: Injection of Isovue-M 200 shows a good epidural pattern with spread above and below the level of needle placement, primarily on the right. No vascular opacification is seen. THERAPEUTIC EPIDURAL INJECTION: 120 mg of Depo-Medrol mixed with 2 mL of 1% lidocaine were instilled. The procedure was well-tolerated, and the patient was discharged thirty minutes following the injection in good condition. COMPLICATIONS: None immediate. IMPRESSION: Technically successful interlaminar epidural injection on the right at L3-L4. Electronically Signed   By: Titus Dubin M.D.   On: 12/28/2019 09:36    Assessment & Plan:   There are no diagnoses linked to this encounter.   No orders of the defined types were placed in this encounter.    Follow-up: No follow-ups on file.  Walker Kehr, MD

## 2020-01-29 NOTE — Addendum Note (Signed)
Addended by: Boris Lown B on: 01/29/2020 09:00 AM   Modules accepted: Orders

## 2020-01-29 NOTE — Assessment & Plan Note (Signed)
[place]  CXR

## 2020-01-29 NOTE — Assessment & Plan Note (Signed)
Labs

## 2020-01-30 ENCOUNTER — Ambulatory Visit: Payer: Self-pay

## 2020-01-31 ENCOUNTER — Ambulatory Visit: Payer: 59 | Admitting: Family Medicine

## 2020-01-31 ENCOUNTER — Other Ambulatory Visit: Payer: Self-pay

## 2020-01-31 ENCOUNTER — Encounter: Payer: Self-pay | Admitting: Family Medicine

## 2020-01-31 VITALS — BP 126/70 | HR 99 | Ht <= 58 in | Wt 154.0 lb

## 2020-01-31 DIAGNOSIS — G8929 Other chronic pain: Secondary | ICD-10-CM | POA: Diagnosis not present

## 2020-01-31 DIAGNOSIS — M545 Low back pain, unspecified: Secondary | ICD-10-CM

## 2020-01-31 DIAGNOSIS — M5416 Radiculopathy, lumbar region: Secondary | ICD-10-CM | POA: Diagnosis not present

## 2020-01-31 NOTE — Progress Notes (Signed)
[place] [address] [place] Phone: [phone] Subjective:   I Renee Mann am serving as a Education administrator for Dr. Hulan Saas.  This visit occurred during the SARS-CoV-2 public health emergency.  Safety protocols were in place, including screening questions prior to the visit, additional usage of staff PPE, and extensive cleaning of exam room while observing appropriate contact time as indicated for disinfecting solutions.   I'm seeing this patient by the request  of:  Plotnikov, [name], MD  CC: Low back pain follow-up  [id number]  Renee Mann is a 64 y.o. female coming in with complaint of lumbar radiculopathy. Epidural 12/28/2019. Patient states she is not doing better. Epidural helped for a few weeks.  Patient states that if this was feeling approximately 85% better.  Having some mild leg pain at this time.  More starting on the hip again.  Patient states making it difficult to do daily activities as well as walking greater than 200 feet.  Patient did have an MRI that was independently visualized by me showing L4 nerve root impingement bilaterally secondary to a posterior bulging disc.      Past Medical History:  Diagnosis Date  . Diabetes mellitus 2008   type II  . Gastroesophageal reflux disease   . Hives   . Hypertension   . Osteoarthritis   . 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 ABDOMINAL HYSTERECTOMY  1987  . burritis surgery Left 1998   hip   Social History   Socioeconomic History  . Marital status: Married    Spouse name: Not on file  . Number of children: 1  . Years of education: Not on file  . Highest education level: Not on file  Occupational History  . 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 Smoking status: Former Smoker    Quit date: 08/24/1998    Years since quitting: 21.4  . Smokeless tobacco: Never Used    Substance and Sexual Activity  . Alcohol use: No    Alcohol/week: 0.0 standard drinks  . Drug use: No  . Sexual activity: Yes  Other Topics Concern  . Not on file  Social History Narrative   ** Merged History Encounter **       Social Determinants of Health   Financial Resource Strain:   . Difficulty of Paying Living Expenses:   Food Insecurity:   . Worried About Charity fundraiser in the Last Year:   . Arboriculturist in the Last Year:   Transportation Needs:   . Film/video editor (Medical):   [name] Lack of Transportation (Non-Medical):   Physical Activity:   . Days of Exercise per Week:   . Minutes of Exercise per Session:   Stress:   . Feeling of Stress :   Social Connections:   . Frequency of Communication with Friends and Family:   . Frequency of Social Gatherings with Friends and Family:   . Attends Religious Services:   . Active Member of Clubs or Organizations:   . Attends Archivist Meetings:   [name] Marital Status:    Allergies  Allergen Reactions  . Codeine Hives  . Atenolol Other (See Comments)    Weird feelings  . Hydrochlorothiazide W-Triamterene Other (See Comments)    headache   Family History  Problem Relation  Age of Onset  . Hypertension Mother   . Cancer Mother   . Stroke Mother   . Hypertension Sister   . Hypertension Other   . Colon cancer Neg Hx   . Esophageal cancer Neg Hx   . Stomach cancer Neg Hx   . Rectal cancer Neg Hx     Current Outpatient Medications (Endocrine & Metabolic):  .  metFORMIN (GLUCOPHAGE-XR) 750 MG 24 hr tablet, Take 1 tablet by mouth once daily with breakfast   Current Outpatient Medications (Cardiovascular):  .  amLODipine (NORVASC) 5 MG tablet, Take 1 tablet by mouth once daily .  EPINEPHrine (EPIPEN 2-PAK) 0.3 mg/0.3 mL IJ SOAJ injection, Use as directed for severe allergic reaction   Current Outpatient Medications (Respiratory):  .  benzonatate (TESSALON) 200 MG capsule, Take 1 capsule (200 mg  total) by mouth 2 (two) times daily as needed for cough. Noelle Penner ALLERGY RELIEF 10 MG tablet, Take 1 tablet by mouth once daily .  levocetirizine (XYZAL) 5 MG tablet, Take 1 tablet twice daily .  omalizumab (XOLAIR) 150 MG injection, INJECT 300MG SUBCUTANEOUSLY EVERY 28 DAYS (GIVEN AT MD  OFFICE)  Current Facility-Administered Medications (Respiratory):  .  omalizumab Arvid Right) injection 300 mg      Current Outpatient Medications (Other):  .  Blood Glucose Monitoring Suppl (ACCU-CHEK AVIVA PLUS) w/Device KIT, Use to check blood sugar daily. DX E11.9 .  clotrimazole-betamethasone (LOTRISONE) cream, Apply 1 application topically 2 (two) times daily. X 1 wk only .  diclofenac (VOLTAREN) 0.1 % ophthalmic solution, 4 (four) times daily. .  DULoxetine (CYMBALTA) 30 MG capsule, Take 1 capsule (30 mg total) by mouth daily. .  famotidine (PEPCID) 40 MG tablet, Take 1 tablet (40 mg total) by mouth daily. [name]  gabapentin (NEURONTIN) 300 MG capsule, Take 1 capsule (300 mg total) by mouth 3 (three) times daily. [name]  glucose blood (ONE TOUCH ULTRA TEST) test strip, 1 each by Other route daily. Use to check blood sugars once a day .  glucose blood test strip, Use 1 times a day PRN. DX: E11.9 .  glucose blood test strip, USE TO CHECK BLOOD SUGAR DAILY .  tiZANidine (ZANAFLEX) 4 MG tablet, Take 1 tablet (4 mg total) by mouth every 8 (eight) hours as needed for muscle spasms. .  Triamcinolone Acetonide (TRIAMCINOLONE 0.1 % CREAM : EUCERIN) CREA, Apply 1 application topically 2 (two) times daily.    Reviewed prior external information including notes and imaging from  primary care provider As well as notes that were available from care everywhere and other healthcare systems.  Past medical history, social, surgical and family history all reviewed in electronic medical record.  No pertanent information unless stated regarding to the chief complaint.   Review of Systems:  No headache, visual changes, nausea,  vomiting, diarrhea, constipation, dizziness, abdominal pain, skin rash, fevers, chills, night sweats, weight loss, swollen lymph nodes, body aches, joint swelling, chest pain, shortness of breath, mood changes. POSITIVE muscle aches  Objective  Blood pressure 126/70, pulse 99, height _0  (1.473 m), weight 154 lb (69.9 kg), SpO2 98 %.   General: No apparent distress alert and oriented x3 mood and affect normal, dressed appropriately.  HEENT: Pupils equal, extraocular movements intact  Respiratory: Patient's speak in full sentences and does not appear short of breath  Cardiovascular: No lower extremity edema, non tender, no erythema  Neuro: Cranial nerves II through XII are intact, neurovascularly intact in all extremities with 2+ DTRs and 2+  pulses.  Gait normal with good balance and coordination.  MSK: Mild arthritic changes of multiple joints. Back exam shows loss of lordosis tender to palpation diffusely in the paraspinal musculature lumbar spine.  Tightness with FABER test positive straight leg test on the right side a 25 degrees of forward flexion but strength and deep tendon reflexes intact   Impression and Recommendations:     The above documentation has been reviewed and is accurate and complete Lyndal Pulley, DO       Note: This dictation was prepared with Dragon dictation along with smaller phrase technology. Any transcriptional errors that result from this process are unintentional.

## 2020-01-31 NOTE — Patient Instructions (Addendum)
Good to see you Call [facility] to schedule epidural [phone] Continue exercises Continue medications See me again in 4 weeks after epidural

## 2020-01-31 NOTE — Assessment & Plan Note (Signed)
History of a lumbar radiculopathy.  MRI did show that patient had bilateral L4 nerve root impingement that is consistent with some of patient pain is additionally the hip pain.  Discussed with patient about icing regimen, home exercises would like to repeat epidural with patient responding previously.  Continue the Cymbalta increase exercises.  Patient still declines therapy.  Follow-up with me again in 4 to 8 weeks after the epidural

## 2020-02-02 ENCOUNTER — Other Ambulatory Visit: Payer: Self-pay | Admitting: Internal Medicine

## 2020-02-02 MED ORDER — REPAGLINIDE 1 MG PO TABS: 1.0000 mg | ORAL_TABLET | Freq: Three times a day (TID) | ORAL | 11 refills | Status: DC

## 2020-02-08 ENCOUNTER — Ambulatory Visit
Admission: RE | Admit: 2020-02-08 | Discharge: 2020-02-08 | Disposition: A | Discharge status: Home / Self Care | Payer: 59 | Source: Ambulatory Visit | Attending: Family Medicine | Admitting: Family Medicine

## 2020-02-08 ENCOUNTER — Other Ambulatory Visit: Payer: Self-pay

## 2020-02-08 DIAGNOSIS — M545 Low back pain, unspecified: Secondary | ICD-10-CM

## 2020-02-08 MED ORDER — IOPAMIDOL (ISOVUE-M 200) INJECTION 41%
1.0000 mL | Freq: Once | INTRAMUSCULAR | Status: AC
  Administered 2020-02-08: 1 mL via EPIDURAL

## 2020-02-08 MED ORDER — METHYLPREDNISOLONE ACETATE 40 MG/ML INJ SUSP (RADIOLOG
120.0000 mg | Freq: Once | INTRAMUSCULAR | Status: AC
  Administered 2020-02-08: 120 mg via EPIDURAL

## 2020-02-08 NOTE — Discharge Instructions (Signed)

## 2020-02-14 ENCOUNTER — Other Ambulatory Visit: Payer: Self-pay

## 2020-02-14 ENCOUNTER — Encounter: Payer: Self-pay | Admitting: Internal Medicine

## 2020-02-14 ENCOUNTER — Ambulatory Visit: Payer: 59 | Admitting: Internal Medicine

## 2020-02-14 ENCOUNTER — Ambulatory Visit: Payer: Self-pay

## 2020-02-14 DIAGNOSIS — I10 Essential (primary) hypertension: Secondary | ICD-10-CM | POA: Diagnosis not present

## 2020-02-14 DIAGNOSIS — R5383 Other fatigue: Secondary | ICD-10-CM

## 2020-02-14 DIAGNOSIS — M5416 Radiculopathy, lumbar region: Secondary | ICD-10-CM

## 2020-02-14 DIAGNOSIS — E119 Type 2 diabetes mellitus without complications: Secondary | ICD-10-CM | POA: Diagnosis not present

## 2020-02-14 NOTE — Assessment & Plan Note (Signed)
Check BP at home Norvasc

## 2020-02-14 NOTE — Progress Notes (Signed)
Subjective:  Patient ID: Renee Mann, female    DOB: Sep 01, 1955  Age: 64 y.o. MRN: [id number]  CC: No chief complaint on file.   HPI Miyoshi A Peifer presents for fatigue - better, DM and HTN  Outpatient Medications Prior to Visit  Medication Sig Dispense Refill  . amLODipine (NORVASC) 5 MG tablet Take 1 tablet by mouth once daily 30 tablet 11  . benzonatate (TESSALON) 200 MG capsule Take 1 capsule (200 mg total) by mouth 2 (two) times daily as needed for cough. 30 capsule 1  . Blood Glucose Monitoring Suppl (ACCU-CHEK AVIVA PLUS) w/Device KIT Use to check blood sugar daily. DX E11.9 1 kit 0  . clotrimazole-betamethasone (LOTRISONE) cream Apply 1 application topically 2 (two) times daily. X 1 wk only 30 g 0  . diclofenac (VOLTAREN) 0.1 % ophthalmic solution 4 (four) times daily.    . DULoxetine (CYMBALTA) 30 MG capsule Take 1 capsule (30 mg total) by mouth daily. 30 capsule 3  . EPINEPHrine (EPIPEN 2-PAK) 0.3 mg/0.3 mL IJ SOAJ injection Use as directed for severe allergic reaction 1 each 1  . EQ ALLERGY RELIEF 10 MG tablet Take 1 tablet by mouth once daily 100 tablet 3  . famotidine (PEPCID) 40 MG tablet Take 1 tablet (40 mg total) by mouth daily. 90 tablet 3  . gabapentin (NEURONTIN) 300 MG capsule Take 1 capsule (300 mg total) by mouth 3 (three) times daily. 90 capsule 3  . glucose blood (ONE TOUCH ULTRA TEST) test strip 1 each by Other route daily. Use to check blood sugars once a day 100 each 12  . glucose blood test strip Use 1 times a day PRN. DX: E11.9 100 each 3  . glucose blood test strip USE TO CHECK BLOOD SUGAR DAILY 50 each 5  . levocetirizine (XYZAL) 5 MG tablet Take 1 tablet twice daily 60 tablet 11  . metFORMIN (GLUCOPHAGE-XR) 750 MG 24 hr tablet Take 1 tablet by mouth once daily with breakfast 90 tablet 3  . omalizumab (XOLAIR) 150 MG injection INJECT 300MG SUBCUTANEOUSLY EVERY 28 DAYS (GIVEN AT MD  OFFICE) 2 each 11  . repaglinide (PRANDIN) 1 MG tablet Take 1 tablet (1 mg  total) by mouth 3 (three) times daily before meals. 90 tablet 11  . tiZANidine (ZANAFLEX) 4 MG tablet Take 1 tablet (4 mg total) by mouth every 8 (eight) hours as needed for muscle spasms. 30 tablet 1  . Triamcinolone Acetonide (TRIAMCINOLONE 0.1 % CREAM : EUCERIN) CREA Apply 1 application topically 2 (two) times daily. 1 each 2   Facility-Administered Medications Prior to Visit  Medication Dose Route Frequency Provider Last Rate Last Admin  . omalizumab Arvid Right) injection 300 mg  300 mg Subcutaneous Q28 days [name], MD   300 mg at 01/02/20 1659    ROS: Review of Systems  Constitutional: Negative for activity change, appetite change, chills, fatigue and unexpected weight change.  HENT: Negative for congestion, mouth sores and sinus pressure.   Eyes: Negative for visual disturbance.  Respiratory: Negative for cough and chest tightness.   Gastrointestinal: Negative for abdominal pain and nausea.  Genitourinary: Negative for difficulty urinating, frequency and vaginal pain.  Musculoskeletal: Negative for back pain and gait problem.  Skin: Negative for pallor and rash.  Neurological: Negative for dizziness, tremors, weakness, numbness and headaches.  Psychiatric/Behavioral: Negative for confusion, sleep disturbance and suicidal ideas.    Objective:  BP (!) 158/90 (BP Location: Left Arm, Patient Position: Sitting, Cuff Size: Normal)  Pulse 76   Temp 98.4 F (36.9 C) (Oral)   Ht 4' 10" (1.473 m)   Wt 155 lb (70.3 kg)   SpO2 99%   BMI 32.40 kg/m   BP Readings from Last 3 Encounters:  02/14/20 (!) 158/90  02/08/20 (!) 170/84  01/31/20 126/70    Wt Readings from Last 3 Encounters:  02/14/20 155 lb (70.3 kg)  01/31/20 154 lb (69.9 kg)  01/29/20 151 lb (68.5 kg)    Physical Exam Constitutional:      General: She is not in acute distress.    Appearance: She is well-developed.  HENT:     Head: Normocephalic.     Right Ear: External ear normal.     Left Ear: External  ear normal.     Nose: Nose normal.  Eyes:     General:        Right eye: No discharge.        Left eye: No discharge.     Conjunctiva/sclera: Conjunctivae normal.     Pupils: Pupils are equal, round, and reactive to light.  Neck:     Thyroid: No thyromegaly.     Vascular: No JVD.     Trachea: No tracheal deviation.  Cardiovascular:     Rate and Rhythm: Normal rate and regular rhythm.     Heart sounds: Normal heart sounds.  Pulmonary:     Effort: No respiratory distress.     Breath sounds: No stridor. No wheezing.  Abdominal:     General: Bowel sounds are normal. There is no distension.     Palpations: Abdomen is soft. There is no mass.     Tenderness: There is no abdominal tenderness. There is no guarding or rebound.  Musculoskeletal:        General: No tenderness.     Cervical back: Normal range of motion and neck supple.  Lymphadenopathy:     Cervical: No cervical adenopathy.  Skin:    Findings: No erythema or rash.  Neurological:     Cranial Nerves: No cranial nerve deficit.     Motor: No abnormal muscle tone.     Coordination: Coordination normal.     Deep Tendon Reflexes: Reflexes normal.  Psychiatric:        Behavior: Behavior normal.        Thought Content: Thought content normal.        Judgment: Judgment normal.     Lab Results  Component Value Date   WBC 6.2 01/29/2020   HGB 13.0 01/29/2020   HCT 39.3 01/29/2020   PLT 394.0 01/29/2020   GLUCOSE 115 (H) 01/29/2020   CHOL 165 10/10/2018   TRIG 71.0 10/10/2018   HDL 36.40 (L) 10/10/2018   LDLCALC 115 (H) 10/10/2018   ALT 59 (H) 01/29/2020   AST 47 (H) 01/29/2020   NA 139 01/29/2020   K 3.8 01/29/2020   CL 101 01/29/2020   CREATININE 0.72 01/29/2020   BUN 7 01/29/2020   CO2 29 01/29/2020   TSH 1.90 01/29/2020   HGBA1C 8.0 (H) 01/29/2020   MICROALBUR <0.7 10/10/2018    DG INJECT DIAG/THERA/INC NEEDLE/CATH/PLC EPI/LUMB/SAC W/IMG  Result Date: 02/08/2020 CLINICAL DATA:  Lumbosacral spondylosis  without myelopathy. Right greater than left low back and leg pain. 80% relief for a month after prior injection 6 weeks ago. FLUOROSCOPY TIME:  Radiation Exposure Index (as provided by the fluoroscopic device): 4.4 mGy Fluoroscopy Time:  27 seconds Number of Acquired Images:  0 PROCEDURE: The procedure, risks, benefits,  and alternatives were explained to the patient. Questions regarding the procedure were encouraged and answered. The patient understands and consents to the procedure. LUMBAR EPIDURAL INJECTION: An interlaminar approach was performed on the right at L3-L4. The overlying skin was cleansed and anesthetized. A 3.5 inch 20 gauge epidural needle was advanced using loss-of-resistance technique. DIAGNOSTIC EPIDURAL INJECTION: Injection of Isovue-M 200 shows a good epidural pattern with spread above and below the level of needle placement, primarily on the right. No vascular opacification is seen. THERAPEUTIC EPIDURAL INJECTION: 120 mg of Depo-Medrol mixed with 3 mL of 1% lidocaine were instilled. The procedure was well-tolerated, and the patient was discharged thirty minutes following the injection in good condition. COMPLICATIONS: None immediate. IMPRESSION: Technically successful epidural injection on the right at L3-L4. Electronically Signed   By: Titus Dubin M.D.   On: 02/08/2020 09:26    Assessment & Plan:     Follow-up: No follow-ups on file.  Walker Kehr, MD

## 2020-02-14 NOTE — Assessment & Plan Note (Signed)
Just had an epidural last wk - better CBG bid

## 2020-02-14 NOTE — Assessment & Plan Note (Signed)
Metformin  Prandin   Just had an epidural CBG bid

## 2020-02-14 NOTE — Patient Instructions (Signed)

## 2020-02-14 NOTE — Assessment & Plan Note (Signed)
Better w/improved DM control

## 2020-02-16 ENCOUNTER — Other Ambulatory Visit: Payer: Self-pay

## 2020-02-16 ENCOUNTER — Ambulatory Visit (INDEPENDENT_AMBULATORY_CARE_PROVIDER_SITE_OTHER): Payer: 59

## 2020-02-16 DIAGNOSIS — L501 Idiopathic urticaria: Secondary | ICD-10-CM

## 2020-02-16 DIAGNOSIS — L509 Urticaria, unspecified: Secondary | ICD-10-CM

## 2020-02-18 ENCOUNTER — Other Ambulatory Visit: Payer: Self-pay | Admitting: Internal Medicine

## 2020-03-11 ENCOUNTER — Telehealth: Payer: Self-pay | Admitting: *Deleted

## 2020-03-11 NOTE — Telephone Encounter (Signed)
Patient advised needs MD appt prior to her next injection 8/20 for Ins reapproval for Xolair

## 2020-03-12 ENCOUNTER — Other Ambulatory Visit: Payer: Self-pay

## 2020-03-12 ENCOUNTER — Encounter: Payer: Self-pay | Admitting: Family Medicine

## 2020-03-12 ENCOUNTER — Ambulatory Visit: Payer: 59 | Admitting: Family Medicine

## 2020-03-12 VITALS — BP 130/80 | HR 100 | Ht <= 58 in | Wt 156.0 lb

## 2020-03-12 DIAGNOSIS — M545 Low back pain: Secondary | ICD-10-CM

## 2020-03-12 DIAGNOSIS — M5416 Radiculopathy, lumbar region: Secondary | ICD-10-CM | POA: Diagnosis not present

## 2020-03-12 DIAGNOSIS — G8929 Other chronic pain: Secondary | ICD-10-CM

## 2020-03-12 MED ORDER — KETOROLAC TROMETHAMINE 30 MG/ML IJ SOLN
30.0000 mg | Freq: Once | INTRAMUSCULAR | Status: AC
  Administered 2020-03-12: 30 mg via INTRAMUSCULAR

## 2020-03-12 NOTE — Assessment & Plan Note (Signed)
Chronic problem with exacerbation patient continues to have lumbar radiculopathy.  Encourage patient to just take the gabapentin at night with patient having drowsiness during the day.  Patient is not responding as well to the Cymbalta.  Patient has gotten very intermittent improvement.  Been short-lived with the epidurals and patient's MRI is consistent with the nerve root impingement at the L4.  Will refer patient to orthopedic surgery back specialist for further evaluation and treatment.  Patient is in agreement with the plan.  Follow-up Depo-Medrol given today.

## 2020-03-12 NOTE — Progress Notes (Signed)
[facility] [address] [place] Phone: [phone] Subjective:   I Renee Mann am serving as a Education administrator for Dr. Hulan Saas.  This visit occurred during the SARS-CoV-2 public health emergency.  Safety protocols were in place, including screening questions prior to the visit, additional usage of staff PPE, and extensive cleaning of exam room while observing appropriate contact time as indicated for disinfecting solutions.   I'm seeing this patient by the request  of:  Plotnikov, [name], MD  CC: Low back pain follow-up  SFK:[id number]   01/31/2020 History of a lumbar radiculopathy.  MRI did show that patient had bilateral L4 nerve root impingement that is consistent with some of patient pain is additionally the hip pain.  Discussed with patient about icing regimen, home exercises would like to repeat epidural with patient responding previously.  Continue the Cymbalta increase exercises.  Patient still declines therapy.  Follow-up with me again in 4 to 8 weeks after the epidural  03/12/2020 Renee Mann is a 64 y.o. female coming in with complaint of low back pain. Patient states her pain is radiating down her leg and she is having a hard time sleeping. Gabapentin not really helping.  Patient has failed all conservative therapy including anti-inflammatories, Zanaflex, and gabapentin.  Patient has difficulty taking the gabapentin regularly secondary to her job duties taking care of her children.  Patient's MRI previously did show bilateral L4 nerve root impingement secondary to protruding disc and mild degenerative disc disease.  Patient has had now 2 epidurals done in this facility and has had improvement of 80 to 90% improvement but only for 48 to 96 hours.  Patient states that it is affecting daily activities and can wake her up at night.       Past Medical History:  Diagnosis Date  . Diabetes mellitus 2008   type II  . Gastroesophageal  reflux disease   . Hives   . Hypertension   . Osteoarthritis   . 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 ABDOMINAL HYSTERECTOMY  1987  . burritis surgery Left 1998   hip   Social History   Socioeconomic History  . Marital status: Married    Spouse name: Not on file  . Number of children: 1  . Years of education: Not on file  . Highest education level: Not on file  Occupational History  . 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 Smoking status: Former Smoker    Quit date: 08/24/1998    Years since quitting: 21.5  . Smokeless tobacco: Never Used  Vaping Use  . Vaping Use: Never used  Substance and Sexual Activity  . Alcohol use: No    Alcohol/week: 0.0 standard drinks  . Drug use: No  . Sexual activity: Yes  Other Topics Concern  . Not on file  Social History Narrative   ** Merged History Encounter **       Social Determinants of Health   Financial Resource Strain:   . Difficulty of Paying Living Expenses:   Food Insecurity:   . Worried About Charity fundraiser in the Last Year:   . Arboriculturist in the Last Year:   Transportation Needs:   . Film/video editor (Medical):   [name] Lack of Transportation (Non-Medical):   Physical Activity:   .  Days of Exercise per Week:   . Minutes of Exercise per Session:   Stress:   . Feeling of Stress :   Social Connections:   . Frequency of Communication with Friends and Family:   . Frequency of Social Gatherings with Friends and Family:   . Attends Religious Services:   . Active Member of Clubs or Organizations:   . Attends Archivist Meetings:   [name] Marital Status:    Allergies  Allergen Reactions  . Codeine Hives  . Atenolol Other (See Comments)    Weird feelings  . Hydrochlorothiazide W-Triamterene Other (See Comments)    headache   Family History  Problem Relation Age of Onset  . Hypertension Mother   . Cancer  Mother   . Stroke Mother   . Hypertension Sister   . Hypertension Other   . Colon cancer Neg Hx   . Esophageal cancer Neg Hx   . Stomach cancer Neg Hx   . Rectal cancer Neg Hx     Current Outpatient Medications (Endocrine & Metabolic):  .  metFORMIN (GLUCOPHAGE-XR) 750 MG 24 hr tablet, Take 1 tablet by mouth once daily with breakfast .  repaglinide (PRANDIN) 1 MG tablet, Take 1 tablet (1 mg total) by mouth 3 (three) times daily before meals.   Current Outpatient Medications (Cardiovascular):  .  amLODipine (NORVASC) 5 MG tablet, Take 1 tablet by mouth once daily .  EPINEPHrine (EPIPEN 2-PAK) 0.3 mg/0.3 mL IJ SOAJ injection, Use as directed for severe allergic reaction   Current Outpatient Medications (Respiratory):  .  benzonatate (TESSALON) 200 MG capsule, Take 1 capsule (200 mg total) by mouth 2 (two) times daily as needed for cough. Noelle Penner ALLERGY RELIEF 10 MG tablet, Take 1 tablet by mouth once daily .  levocetirizine (XYZAL) 5 MG tablet, Take 1 tablet twice daily .  omalizumab (XOLAIR) 150 MG injection, INJECT 300MG SUBCUTANEOUSLY EVERY 28 DAYS (GIVEN AT MD  OFFICE)  Current Facility-Administered Medications (Respiratory):  .  omalizumab Arvid Right) injection 300 mg      Current Outpatient Medications (Other):  [name]  Accu-Chek Softclix Lancets lancets, USE TO CHECK BLOOD SUGAR DAILY .  Blood Glucose Monitoring Suppl (ACCU-CHEK AVIVA PLUS) w/Device KIT, Use to check blood sugar daily. DX E11.9 .  clotrimazole-betamethasone (LOTRISONE) cream, Apply 1 application topically 2 (two) times daily. X 1 wk only .  diclofenac (VOLTAREN) 0.1 % ophthalmic solution, 4 (four) times daily. .  DULoxetine (CYMBALTA) 30 MG capsule, Take 1 capsule (30 mg total) by mouth daily. .  famotidine (PEPCID) 40 MG tablet, Take 1 tablet (40 mg total) by mouth daily. [name]  gabapentin (NEURONTIN) 300 MG capsule, Take 1 capsule (300 mg total) by mouth 3 (three) times daily. [name]  glucose blood (ONE TOUCH ULTRA  TEST) test strip, 1 each by Other route daily. Use to check blood sugars once a day .  glucose blood test strip, Use 1 times a day PRN. DX: E11.9 .  glucose blood test strip, USE TO CHECK BLOOD SUGAR DAILY .  tiZANidine (ZANAFLEX) 4 MG tablet, Take 1 tablet (4 mg total) by mouth every 8 (eight) hours as needed for muscle spasms. .  Triamcinolone Acetonide (TRIAMCINOLONE 0.1 % CREAM : EUCERIN) CREA, Apply 1 application topically 2 (two) times daily.    Reviewed prior external information including notes and imaging from  primary care provider As well as notes that were available from care everywhere and other healthcare systems.  Past medical history, social, surgical  and family history all reviewed in electronic medical record.  No pertanent information unless stated regarding to the chief complaint.   Review of Systems:  No headache, visual changes, nausea, vomiting, diarrhea, constipation, dizziness, abdominal pain, skin rash, fevers, chills, night sweats, weight loss, swollen lymph nodes, , joint swelling, chest pain, shortness of breath, mood changes. POSITIVE muscle aches, body aches  Objective  Blood pressure 130/80, pulse 100, height 4' 10"  (1.473 m), weight 156 lb (70.8 kg), SpO2 98 %.   General: No apparent distress alert and oriented x3 mood and affect normal, dressed appropriately.  HEENT: Pupils equal, extraocular movements intact  Respiratory: Patient's speak in full sentences and does not appear short of breath  Cardiovascular: No lower extremity edema, non tender, no erythema  Neuro: Cranial nerves II through XII are intact, neurovascularly intact in all extremities with 2+ DTRs and 2+ pulses.  Gait normal with good balance and coordination.  MSK: Arthritic changes of multiple joints Low back exam has some loss of lordosis.  Tender to palpation in the paraspinal musculature lumbar spine.  Patient does have tightness with mild radicular symptoms mostly in the L4 distribution  right greater than left.  Neurovascularly intact distally.  No foot drop noted.  Deep tendon reflexes are intact.   Impression and Recommendations:     The above documentation has been reviewed and is accurate and complete Lyndal Pulley, DO       Note: This dictation was prepared with Dragon dictation along with smaller phrase technology. Any transcriptional errors that result from this process are unintentional.

## 2020-03-12 NOTE — Patient Instructions (Addendum)
Good to see you Dr. Lynann Bologna Lets hold until you talk to new doctor. Let me know if you have an questions

## 2020-03-15 ENCOUNTER — Other Ambulatory Visit: Payer: Self-pay

## 2020-03-15 ENCOUNTER — Ambulatory Visit (INDEPENDENT_AMBULATORY_CARE_PROVIDER_SITE_OTHER): Payer: 59

## 2020-03-15 DIAGNOSIS — L501 Idiopathic urticaria: Secondary | ICD-10-CM

## 2020-03-15 DIAGNOSIS — L509 Urticaria, unspecified: Secondary | ICD-10-CM

## 2020-03-27 ENCOUNTER — Other Ambulatory Visit: Payer: Self-pay | Admitting: *Deleted

## 2020-03-27 MED ORDER — XOLAIR 150 MG ~~LOC~~ SOLR: SUBCUTANEOUS | 11 refills | Status: DC

## 2020-04-09 ENCOUNTER — Ambulatory Visit: Payer: 59 | Admitting: Allergy and Immunology

## 2020-04-09 ENCOUNTER — Encounter: Payer: Self-pay | Admitting: Allergy and Immunology

## 2020-04-09 ENCOUNTER — Other Ambulatory Visit: Payer: Self-pay

## 2020-04-09 VITALS — BP 118/72 | HR 77 | Temp 98.0°F | Resp 16 | Ht 59.0 in | Wt 155.2 lb

## 2020-04-09 DIAGNOSIS — L299 Pruritus, unspecified: Secondary | ICD-10-CM

## 2020-04-09 DIAGNOSIS — L509 Urticaria, unspecified: Secondary | ICD-10-CM | POA: Diagnosis not present

## 2020-04-09 MED ORDER — EPINEPHRINE 0.3 MG/0.3ML IJ SOAJ: INTRAMUSCULAR | 1 refills | Status: DC

## 2020-04-09 NOTE — Progress Notes (Signed)
[place] [facility] [place] [facility] [place]   Follow-up Note  Referring Provider: Cassandria Anger, MD Primary Provider: Cassandria Anger, MD Date of Office Visit: 04/09/2020  Subjective:   Renee Mann (DOB: [birth year]) is a 64 y.o. female who returns to the [facility] [place] on 04/09/2020 in re-evaluation of the following:  HPI: Renee Mann returns to this clinic in reevaluation of urticaria treated with omalizumab.  Her last visit to this clinic was 28 March 2019.  She has had an excellent year regarding her urticaria.  During her last visit she still had some itchiness on a regular basis for which we started her on levo cetirizine and all of her itchiness resolved and she has not had any outbreaks.  She continues on Xolair on a regular basis without any adverse effects.  She has obtain 2 Pfizer Covid vaccinations.  Allergies as of 04/09/2020      Reactions   Codeine Hives   Atenolol Other (See Comments)   Weird feelings   Hydrochlorothiazide W-triamterene Other (See Comments)   headache      Medication List      Accu-Chek Aviva Plus w/Device Kit Use to check blood sugar daily. DX E11.9   Accu-Chek Softclix Lancets lancets USE TO CHECK BLOOD SUGAR DAILY   amLODipine 5 MG tablet Commonly known as: NORVASC Take 1 tablet by mouth once daily   clotrimazole-betamethasone cream Commonly known as: Lotrisone Apply 1 application topically 2 (two) times daily. X 1 wk only   diclofenac 0.1 % ophthalmic solution Commonly known as: VOLTAREN 4 (four) times daily.   DULoxetine 30 MG capsule Commonly known as: Cymbalta Take 1 capsule (30 mg total) by mouth daily.   EPINEPHrine 0.3 mg/0.3 mL Soaj injection Commonly known as: EpiPen 2-Pak Use as directed for severe allergic reaction   EQ Allergy Relief 10 MG tablet Generic drug: loratadine Take 1 tablet by mouth once daily   famotidine 40 MG tablet Commonly known as: Pepcid Take 1 tablet (40  mg total) by mouth daily.   gabapentin 300 MG capsule Commonly known as: NEURONTIN Take 1 capsule (300 mg total) by mouth 3 (three) times daily.   glucose blood test strip Use 1 times a day PRN. DX: E11.9   glucose blood test strip Commonly known as: ONE TOUCH ULTRA TEST 1 each by Other route daily. Use to check blood sugars once a day   glucose blood test strip USE TO CHECK BLOOD SUGAR DAILY   levocetirizine 5 MG tablet Commonly known as: XYZAL Take 1 tablet twice daily   metFORMIN 750 MG 24 hr tablet Commonly known as: GLUCOPHAGE-XR Take 1 tablet by mouth once daily with breakfast   repaglinide 1 MG tablet Commonly known as: Prandin Take 1 tablet (1 mg total) by mouth 3 (three) times daily before meals.   tiZANidine 4 MG tablet Commonly known as: Zanaflex Take 1 tablet (4 mg total) by mouth every 8 (eight) hours as needed for muscle spasms.   triamcinolone 0.1 % cream : eucerin Crea Apply 1 application topically 2 (two) times daily.   Xolair 150 MG injection Generic drug: omalizumab INJECT 300MG SUBCUTANEOUSLY EVERY 28 DAYS (GIVEN AT MD  OFFICE)       Past Medical History:  Diagnosis Date  . Diabetes mellitus 2008   type II  . Gastroesophageal reflux disease   . Hives   . Hypertension   . Osteoarthritis   . Palsy ([place])    6th  CN palsy OS h/o    Past Surgical History:  Procedure Laterality Date  . ABDOMINAL HYSTERECTOMY  1987  . burritis surgery Left 1998   hip    Review of systems negative except as noted in HPI / PMHx or noted below:  Review of Systems  Constitutional: Negative.   HENT: Negative.   Eyes: Negative.   Respiratory: Negative.   Cardiovascular: Negative.   Gastrointestinal: Negative.   Genitourinary: Negative.   Musculoskeletal: Negative.   Skin: Negative.   Neurological: Negative.   Endo/Heme/Allergies: Negative.   Psychiatric/Behavioral: Negative.      Objective:   Vitals:   04/09/20 1114  BP: 118/72  Pulse: 77    Resp: 16  Temp: 98 F (36.7 C)  SpO2: 97%   Height: _0  (149.9 cm)  Weight: 155 lb 3.2 oz (70.4 kg)   Physical Exam Constitutional:      Appearance: She is not diaphoretic.  HENT:     Head: Normocephalic.     Right Ear: Tympanic membrane, ear canal and external ear normal.     Left Ear: Tympanic membrane, ear canal and external ear normal.     Nose: Nose normal. No mucosal edema or rhinorrhea.     Mouth/Throat:     Pharynx: Uvula midline. No oropharyngeal exudate.  Eyes:     Conjunctiva/sclera: Conjunctivae normal.  Neck:     Thyroid: No thyromegaly.     Trachea: Trachea normal. No tracheal tenderness or tracheal deviation.  Cardiovascular:     Rate and Rhythm: Normal rate and regular rhythm.     Heart sounds: Normal heart sounds, S1 normal and S2 normal. No murmur heard.   Pulmonary:     Effort: No respiratory distress.     Breath sounds: Normal breath sounds. No stridor. No wheezing or rales.  Lymphadenopathy:     Head:     Right side of head: No tonsillar adenopathy.     Left side of head: No tonsillar adenopathy.     Cervical: No cervical adenopathy.  Skin:    Findings: No erythema or rash.     Nails: There is no clubbing.  Neurological:     Mental Status: She is alert.     Diagnostics: none   Assessment and Plan:   1. Urticaria   2. Pruritic disorder     1. Every day use levocetirizine 5 mg twice a day  2. Can add OTC Benadryl and 'cream'  if needed  3. Continue Xolair injections 300 mg every month (and Epi-Pen)  4. Return to clinic in 1 year or earlier if problem  5.  Obtain fall flu vaccine (and COVID booster when available)  [name] is really doing very well and I think that we will continue to have her use omalizumab injections and a dose of levo cetirizine which resulted in excellent control of her chronic urticaria and pruritic disorder.  We will see her back in his clinic in 1 year or earlier if there is a problem.  Renee Katz,  MD [facility] [place]

## 2020-04-09 NOTE — Patient Instructions (Addendum)
  1. Every day use levocetirizine 5 mg twice a day  2. Can add OTC Benadryl and 'cream'  if needed  3. Continue Xolair injections 300 mg every month (and Epi-Pen)  4. Return to clinic in 1 year or earlier if problem  5.  Obtain fall flu vaccine (and COVID booster when available)

## 2020-04-10 ENCOUNTER — Encounter: Payer: Self-pay | Admitting: Allergy and Immunology

## 2020-04-12 ENCOUNTER — Ambulatory Visit (INDEPENDENT_AMBULATORY_CARE_PROVIDER_SITE_OTHER): Payer: 59 | Admitting: *Deleted

## 2020-04-12 ENCOUNTER — Other Ambulatory Visit: Payer: Self-pay

## 2020-04-12 DIAGNOSIS — L501 Idiopathic urticaria: Secondary | ICD-10-CM

## 2020-04-12 DIAGNOSIS — L509 Urticaria, unspecified: Secondary | ICD-10-CM

## 2020-04-24 ENCOUNTER — Other Ambulatory Visit: Payer: Self-pay | Admitting: Internal Medicine

## 2020-05-10 ENCOUNTER — Other Ambulatory Visit: Payer: Self-pay

## 2020-05-10 ENCOUNTER — Ambulatory Visit (INDEPENDENT_AMBULATORY_CARE_PROVIDER_SITE_OTHER): Payer: 59 | Admitting: *Deleted

## 2020-05-10 DIAGNOSIS — L501 Idiopathic urticaria: Secondary | ICD-10-CM | POA: Diagnosis not present

## 2020-05-10 DIAGNOSIS — L509 Urticaria, unspecified: Secondary | ICD-10-CM

## 2020-05-11 ENCOUNTER — Other Ambulatory Visit: Payer: Self-pay | Admitting: Allergy and Immunology

## 2020-05-16 ENCOUNTER — Ambulatory Visit: Payer: 59 | Admitting: Internal Medicine

## 2020-05-16 ENCOUNTER — Other Ambulatory Visit: Payer: Self-pay

## 2020-05-16 ENCOUNTER — Encounter: Payer: Self-pay | Admitting: Internal Medicine

## 2020-05-16 DIAGNOSIS — M7989 Other specified soft tissue disorders: Secondary | ICD-10-CM

## 2020-05-16 DIAGNOSIS — R609 Edema, unspecified: Secondary | ICD-10-CM | POA: Diagnosis not present

## 2020-05-16 DIAGNOSIS — I10 Essential (primary) hypertension: Secondary | ICD-10-CM | POA: Diagnosis not present

## 2020-05-16 DIAGNOSIS — E119 Type 2 diabetes mellitus without complications: Secondary | ICD-10-CM | POA: Diagnosis not present

## 2020-05-16 LAB — BASIC METABOLIC PANEL
BUN: 11 mg/dL (ref 6–23)
CO2: 28 mEq/L (ref 19–32)
Calcium: 10 mg/dL (ref 8.4–10.5)
Chloride: 103 mEq/L (ref 96–112)
Creatinine, Ser: 0.74 mg/dL (ref 0.40–1.20)
GFR: 95.08 mL/min (ref >60.00)
Glucose, Bld: 98 mg/dL (ref 70–99)
Potassium: 3.9 mEq/L (ref 3.5–5.1)
Sodium: 142 mEq/L (ref 135–145)

## 2020-05-16 LAB — HEMOGLOBIN A1C: Hgb A1c MFr Bld: 7.6 % — ABNORMAL HIGH (ref 4.6–6.5)

## 2020-05-16 MED ORDER — AMLODIPINE BESYLATE 2.5 MG PO TABS: 2.5000 mg | ORAL_TABLET | Freq: Every day | ORAL | 3 refills | Status: DC

## 2020-05-16 NOTE — Progress Notes (Signed)
Subjective:  Patient ID: Renee Mann, female    DOB: 01/29/1956  Age: 64 y.o. MRN: [id number]  CC: No chief complaint on file.   HPI Renee Mann presents for DM, HTN C/o feet swelling at times F/u LBP - better  Outpatient Medications Prior to Visit  Medication Sig Dispense Refill  . Accu-Chek Softclix Lancets lancets USE TO CHECK BLOOD SUGAR DAILY 100 each 5  . amLODipine (NORVASC) 5 MG tablet Take 1 tablet by mouth once daily 90 tablet 3  . benzonatate (TESSALON) 200 MG capsule Take 1 capsule (200 mg total) by mouth 2 (two) times daily as needed for cough. 30 capsule 1  . Blood Glucose Monitoring Suppl (ACCU-CHEK AVIVA PLUS) w/Device KIT Use to check blood sugar daily. DX E11.9 1 kit 0  . clotrimazole-betamethasone (LOTRISONE) cream Apply 1 application topically 2 (two) times daily. X 1 wk only 30 g 0  . diclofenac (VOLTAREN) 0.1 % ophthalmic solution 4 (four) times daily.    . DULoxetine (CYMBALTA) 30 MG capsule Take 1 capsule (30 mg total) by mouth daily. 30 capsule 3  . EPINEPHrine (EPIPEN 2-PAK) 0.3 mg/0.3 mL IJ SOAJ injection Use as directed for severe allergic reaction 1 each 1  . EQ ALLERGY RELIEF 10 MG tablet Take 1 tablet by mouth once daily 100 tablet 3  . famotidine (PEPCID) 40 MG tablet Take 1 tablet (40 mg total) by mouth daily. 90 tablet 3  . gabapentin (NEURONTIN) 300 MG capsule Take 1 capsule (300 mg total) by mouth 3 (three) times daily. 90 capsule 3  . glucose blood (ONE TOUCH ULTRA TEST) test strip 1 each by Other route daily. Use to check blood sugars once a day 100 each 12  . glucose blood test strip Use 1 times a day PRN. DX: E11.9 100 each 3  . glucose blood test strip USE TO CHECK BLOOD SUGAR DAILY 50 each 5  . levocetirizine (XYZAL) 5 MG tablet Take 1 tablet by mouth twice daily 60 tablet 2  . metFORMIN (GLUCOPHAGE-XR) 750 MG 24 hr tablet Take 1 tablet by mouth once daily with breakfast 90 tablet 3  . omalizumab (XOLAIR) 150 MG injection INJECT 300MG  SUBCUTANEOUSLY EVERY 28 DAYS (GIVEN AT MD  OFFICE) 2 each 11  . repaglinide (PRANDIN) 1 MG tablet Take 1 tablet (1 mg total) by mouth 3 (three) times daily before meals. 90 tablet 11  . tiZANidine (ZANAFLEX) 4 MG tablet Take 1 tablet (4 mg total) by mouth every 8 (eight) hours as needed for muscle spasms. 30 tablet 1  . Triamcinolone Acetonide (TRIAMCINOLONE 0.1 % CREAM : EUCERIN) CREA Apply 1 application topically 2 (two) times daily. 1 each 2   Facility-Administered Medications Prior to Visit  Medication Dose Route Frequency Provider Last Rate Last Admin  . omalizumab Arvid Right) injection 300 mg  300 mg Subcutaneous Q28 days Kozlow, Donnamarie Poag, MD   300 mg at 05/10/20 1054    ROS: Review of Systems  Constitutional: Negative for activity change, appetite change, chills, fatigue and unexpected weight change.  HENT: Negative for congestion, mouth sores and sinus pressure.   Eyes: Negative for visual disturbance.  Respiratory: Negative for cough and chest tightness.   Gastrointestinal: Negative for abdominal pain and nausea.  Genitourinary: Negative for difficulty urinating, frequency and vaginal pain.  Musculoskeletal: Negative for back pain and gait problem.  Skin: Negative for pallor and rash.  Neurological: Negative for dizziness, tremors, weakness, numbness and headaches.  Psychiatric/Behavioral: Negative for confusion, sleep  disturbance and suicidal ideas.    Objective:  BP 124/82   Pulse 94   Temp 98.5 F (36.9 C) (Oral)   Ht _0  (1.499 m)   Wt 147 lb (66.7 kg)   SpO2 98%   BMI 29.69 kg/m   BP Readings from Last 3 Encounters:  05/16/20 124/82  04/09/20 118/72  03/12/20 130/80    Wt Readings from Last 3 Encounters:  05/16/20 147 lb (66.7 kg)  04/09/20 155 lb 3.2 oz (70.4 kg)  03/12/20 156 lb (70.8 kg)    Physical Exam Constitutional:      General: She is not in acute distress.    Appearance: She is well-developed.  HENT:     Head: Normocephalic.     Right Ear:  External ear normal.     Left Ear: External ear normal.     Nose: Nose normal.  Eyes:     General:        Right eye: No discharge.        Left eye: No discharge.     Conjunctiva/sclera: Conjunctivae normal.     Pupils: Pupils are equal, round, and reactive to light.  Neck:     Thyroid: No thyromegaly.     Vascular: No JVD.     Trachea: No tracheal deviation.  Cardiovascular:     Rate and Rhythm: Normal rate and regular rhythm.     Heart sounds: Normal heart sounds.  Pulmonary:     Effort: No respiratory distress.     Breath sounds: No stridor. No wheezing.  Abdominal:     General: Bowel sounds are normal. There is no distension.     Palpations: Abdomen is soft. There is no mass.     Tenderness: There is no abdominal tenderness. There is no guarding or rebound.  Musculoskeletal:        General: No tenderness.     Cervical back: Normal range of motion and neck supple.  Lymphadenopathy:     Cervical: No cervical adenopathy.  Skin:    Findings: No erythema or rash.  Neurological:     Cranial Nerves: No cranial nerve deficit.     Motor: No abnormal muscle tone.     Coordination: Coordination normal.     Deep Tendon Reflexes: Reflexes normal.  Psychiatric:        Behavior: Behavior normal.        Thought Content: Thought content normal.        Judgment: Judgment normal.     Lab Results  Component Value Date   WBC 6.2 01/29/2020   HGB 13.0 01/29/2020   HCT 39.3 01/29/2020   PLT 394.0 01/29/2020   GLUCOSE 115 (H) 01/29/2020   CHOL 165 10/10/2018   TRIG 71.0 10/10/2018   HDL 36.40 (L) 10/10/2018   LDLCALC 115 (H) 10/10/2018   ALT 59 (H) 01/29/2020   AST 47 (H) 01/29/2020   NA 139 01/29/2020   K 3.8 01/29/2020   CL 101 01/29/2020   CREATININE 0.72 01/29/2020   BUN 7 01/29/2020   CO2 29 01/29/2020   TSH 1.90 01/29/2020   HGBA1C 8.0 (H) 01/29/2020   MICROALBUR <0.7 10/10/2018    DG INJECT DIAG/THERA/INC NEEDLE/CATH/PLC EPI/LUMB/SAC W/IMG  Result Date:  02/08/2020 CLINICAL DATA:  Lumbosacral spondylosis without myelopathy. Right greater than left low back and leg pain. 80% relief for a month after prior injection 6 weeks ago. FLUOROSCOPY TIME:  Radiation Exposure Index (as provided by the fluoroscopic device): 4.4 mGy Fluoroscopy Time:  27 seconds Number of Acquired Images:  0 PROCEDURE: The procedure, risks, benefits, and alternatives were explained to the patient. Questions regarding the procedure were encouraged and answered. The patient understands and consents to the procedure. LUMBAR EPIDURAL INJECTION: An interlaminar approach was performed on the right at L3-L4. The overlying skin was cleansed and anesthetized. A 3.5 inch 20 gauge epidural needle was advanced using loss-of-resistance technique. DIAGNOSTIC EPIDURAL INJECTION: Injection of Isovue-M 200 shows a good epidural pattern with spread above and below the level of needle placement, primarily on the right. No vascular opacification is seen. THERAPEUTIC EPIDURAL INJECTION: 120 mg of Depo-Medrol mixed with 3 mL of 1% lidocaine were instilled. The procedure was well-tolerated, and the patient was discharged thirty minutes following the injection in good condition. COMPLICATIONS: None immediate. IMPRESSION: Technically successful epidural injection on the right at L3-L4. Electronically Signed   By: Titus Dubin M.D.   On: 02/08/2020 09:26    Assessment & Plan:    Walker Kehr, MD

## 2020-05-16 NOTE — Assessment & Plan Note (Signed)
Reduce Norvasc due to swelling

## 2020-05-16 NOTE — Addendum Note (Signed)
Addended by: Cresenciano Lick on: 05/16/2020 09:19 AM   Modules accepted: Orders

## 2020-05-16 NOTE — Assessment & Plan Note (Signed)
No relapse 

## 2020-05-16 NOTE — Assessment & Plan Note (Signed)
On Metformin 

## 2020-06-07 ENCOUNTER — Ambulatory Visit: Payer: Self-pay

## 2020-07-09 ENCOUNTER — Other Ambulatory Visit: Payer: Self-pay | Admitting: Obstetrics and Gynecology

## 2020-07-09 DIAGNOSIS — R928 Other abnormal and inconclusive findings on diagnostic imaging of breast: Secondary | ICD-10-CM

## 2020-07-23 ENCOUNTER — Other Ambulatory Visit: Payer: 59

## 2020-08-30 ENCOUNTER — Ambulatory Visit
Admission: RE | Admit: 2020-08-30 | Discharge: 2020-08-30 | Disposition: A | Discharge status: Home / Self Care | Payer: 59 | Source: Ambulatory Visit | Attending: Obstetrics and Gynecology | Admitting: Obstetrics and Gynecology

## 2020-08-30 ENCOUNTER — Other Ambulatory Visit: Payer: Self-pay

## 2020-08-30 DIAGNOSIS — R928 Other abnormal and inconclusive findings on diagnostic imaging of breast: Secondary | ICD-10-CM

## 2020-09-10 ENCOUNTER — Other Ambulatory Visit: Payer: Self-pay | Admitting: Internal Medicine

## 2020-09-16 ENCOUNTER — Other Ambulatory Visit: Payer: Self-pay

## 2020-09-16 ENCOUNTER — Encounter: Payer: Self-pay | Admitting: Internal Medicine

## 2020-09-16 ENCOUNTER — Ambulatory Visit: Payer: 59 | Admitting: Internal Medicine

## 2020-09-16 VITALS — BP 160/80 | HR 90 | Temp 98.4°F | Ht 59.0 in | Wt 154.4 lb

## 2020-09-16 DIAGNOSIS — R609 Edema, unspecified: Secondary | ICD-10-CM

## 2020-09-16 DIAGNOSIS — I10 Essential (primary) hypertension: Secondary | ICD-10-CM

## 2020-09-16 DIAGNOSIS — Z23 Encounter for immunization: Secondary | ICD-10-CM | POA: Diagnosis not present

## 2020-09-16 DIAGNOSIS — L509 Urticaria, unspecified: Secondary | ICD-10-CM | POA: Diagnosis not present

## 2020-09-16 DIAGNOSIS — E119 Type 2 diabetes mellitus without complications: Secondary | ICD-10-CM

## 2020-09-16 LAB — COMPREHENSIVE METABOLIC PANEL
ALT: 19 U/L (ref 0–35)
AST: 16 U/L (ref 0–37)
Albumin: 4.7 g/dL (ref 3.5–5.2)
Alkaline Phosphatase: 108 U/L (ref 39–117)
BUN: 11 mg/dL (ref 6–23)
CO2: 29 mEq/L (ref 19–32)
Calcium: 10.1 mg/dL (ref 8.4–10.5)
Chloride: 102 mEq/L (ref 96–112)
Creatinine, Ser: 0.71 mg/dL (ref 0.40–1.20)
GFR: 88.81 mL/min (ref >60.00)
Glucose, Bld: 123 mg/dL — ABNORMAL HIGH (ref 70–99)
Potassium: 3.9 mEq/L (ref 3.5–5.1)
Sodium: 140 mEq/L (ref 135–145)
Total Bilirubin: 0.4 mg/dL (ref 0.2–1.2)
Total Protein: 8.3 g/dL (ref 6.0–8.3)

## 2020-09-16 LAB — MICROALBUMIN / CREATININE URINE RATIO
Creatinine,U: 41.1 mg/dL
Microalb Creat Ratio: 3.8 mg/g (ref 0.0–30.0)
Microalb, Ur: 1.6 mg/dL (ref 0.0–1.9)

## 2020-09-16 LAB — HEMOGLOBIN A1C: Hgb A1c MFr Bld: 7.8 % — ABNORMAL HIGH (ref 4.6–6.5)

## 2020-09-16 MED ORDER — BLOOD GLUCOSE METER KIT: PACK | 0 refills | Status: DC

## 2020-09-16 NOTE — Progress Notes (Signed)
Subjective:  Patient ID: Renee Mann, female    DOB: [birth year]  Age: 65 y.o. MRN: [id number]  CC: Follow-up   HPI Renee Mann presents for HTN, DM, allergies/hives   Outpatient Medications Prior to Visit  Medication Sig Dispense Refill  . Accu-Chek Softclix Lancets lancets USE TO CHECK BLOOD SUGAR DAILY 100 each 5  . amLODipine (NORVASC) 2.5 MG tablet Take 1 tablet (2.5 mg total) by mouth daily. 90 tablet 3  . benzonatate (TESSALON) 200 MG capsule Take 1 capsule (200 mg total) by mouth 2 (two) times daily as needed for cough. 30 capsule 1  . Blood Glucose Monitoring Suppl (ACCU-CHEK AVIVA PLUS) w/Device KIT Use to check blood sugar daily. DX E11.9 1 kit 0  . clotrimazole-betamethasone (LOTRISONE) cream Apply 1 application topically 2 (two) times daily. X 1 wk only 30 g 0  . diclofenac (VOLTAREN) 0.1 % ophthalmic solution 4 (four) times daily.    . DULoxetine (CYMBALTA) 30 MG capsule Take 1 capsule (30 mg total) by mouth daily. 30 capsule 3  . EPINEPHrine (EPIPEN 2-PAK) 0.3 mg/0.3 mL IJ SOAJ injection Use as directed for severe allergic reaction 1 each 1  . EQ ALLERGY RELIEF 10 MG tablet Take 1 tablet by mouth once daily 100 tablet 3  . glucose blood test strip Use 1 times a day PRN. DX: E11.9 100 each 3  . glucose blood test strip USE TO CHECK BLOOD SUGAR DAILY 50 each 3  . levocetirizine (XYZAL) 5 MG tablet Take 1 tablet by mouth twice daily 60 tablet 2  . meloxicam (MOBIC) 15 MG tablet Take 15 mg by mouth daily as needed.    . metFORMIN (GLUCOPHAGE-XR) 750 MG 24 hr tablet Take 1 tablet by mouth once daily with breakfast 90 tablet 3  . omalizumab (XOLAIR) 150 MG injection INJECT 300MG SUBCUTANEOUSLY EVERY 28 DAYS (GIVEN AT MD  OFFICE) 2 each 11  . repaglinide (PRANDIN) 1 MG tablet Take 1 tablet (1 mg total) by mouth 3 (three) times daily before meals. 90 tablet 11  . Triamcinolone Acetonide (TRIAMCINOLONE 0.1 % CREAM : EUCERIN) CREA Apply 1 application topically 2 (two) times  daily. 1 each 2  . Accu-Chek FastClix Lancets MISC     . famotidine (PEPCID) 40 MG tablet Take 1 tablet (40 mg total) by mouth daily. (Patient not taking: Reported on 09/16/2020) 90 tablet 3  . gabapentin (NEURONTIN) 300 MG capsule Take 1 capsule (300 mg total) by mouth 3 (three) times daily. (Patient not taking: Reported on 09/16/2020) 90 capsule 3  . glucose blood (ONE TOUCH ULTRA TEST) test strip 1 each by Other route daily. Use to check blood sugars once a day (Patient not taking: Reported on 09/16/2020) 100 each 12  . tiZANidine (ZANAFLEX) 4 MG tablet Take 1 tablet (4 mg total) by mouth every 8 (eight) hours as needed for muscle spasms. (Patient not taking: Reported on 09/16/2020) 30 tablet 1   Facility-Administered Medications Prior to Visit  Medication Dose Route Frequency Provider Last Rate Last Admin  . omalizumab Arvid Right) injection 300 mg  300 mg Subcutaneous Q28 days Kozlow, Donnamarie Poag, MD   300 mg at 05/10/20 1054    ROS: Review of Systems  Constitutional: Negative for activity change, appetite change, chills, fatigue and unexpected weight change.  HENT: Negative for congestion, mouth sores and sinus pressure.   Eyes: Negative for visual disturbance.  Respiratory: Negative for cough and chest tightness.   Gastrointestinal: Negative for abdominal pain and nausea.  Genitourinary: Negative for difficulty urinating, frequency and vaginal pain.  Musculoskeletal: Negative for back pain and gait problem.  Skin: Negative for pallor and rash.  Neurological: Negative for dizziness, tremors, weakness, numbness and headaches.  Psychiatric/Behavioral: Negative for confusion and sleep disturbance.    Objective:  BP (!) 170/100 (BP Location: Right Arm, Patient Position: Sitting, Cuff Size: Normal)   Pulse 90   Temp 98.4 F (36.9 C) (Oral)   Ht 4' 11"  (1.499 m)   Wt 154 lb 6.4 oz (70 kg)   SpO2 98%   BMI 31.19 kg/m   BP Readings from Last 3 Encounters:  09/16/20 (!) 170/100  05/16/20  124/82  04/09/20 118/72    Wt Readings from Last 3 Encounters:  09/16/20 154 lb 6.4 oz (70 kg)  05/16/20 147 lb (66.7 kg)  04/09/20 155 lb 3.2 oz (70.4 kg)    Physical Exam Constitutional:      General: She is not in acute distress.    Appearance: She is well-developed. She is obese.  HENT:     Head: Normocephalic.     Right Ear: External ear normal.     Left Ear: External ear normal.     Nose: Nose normal.     Mouth/Throat:     Mouth: Oropharynx is clear and moist.  Eyes:     General:        Right eye: No discharge.        Left eye: No discharge.     Conjunctiva/sclera: Conjunctivae normal.     Pupils: Pupils are equal, round, and reactive to light.  Neck:     Thyroid: No thyromegaly.     Vascular: No JVD.     Trachea: No tracheal deviation.  Cardiovascular:     Rate and Rhythm: Normal rate and regular rhythm.     Heart sounds: Normal heart sounds.  Pulmonary:     Effort: No respiratory distress.     Breath sounds: No stridor. No wheezing.  Abdominal:     General: Bowel sounds are normal. There is no distension.     Palpations: Abdomen is soft. There is no mass.     Tenderness: There is no abdominal tenderness. There is no guarding or rebound.  Musculoskeletal:        General: No tenderness or edema.     Cervical back: Normal range of motion and neck supple.  Lymphadenopathy:     Cervical: No cervical adenopathy.  Skin:    Findings: No erythema or rash.  Neurological:     Mental Status: She is oriented to person, place, and time.     Cranial Nerves: No cranial nerve deficit.     Motor: No abnormal muscle tone.     Coordination: Coordination normal.     Deep Tendon Reflexes: Reflexes normal.  Psychiatric:        Mood and Affect: Mood and affect normal.        Behavior: Behavior normal.        Thought Content: Thought content normal.        Judgment: Judgment normal.     Lab Results  Component Value Date   WBC 6.2 01/29/2020   HGB 13.0 01/29/2020   HCT  39.3 01/29/2020   PLT 394.0 01/29/2020   GLUCOSE 98 05/16/2020   CHOL 165 10/10/2018   TRIG 71.0 10/10/2018   HDL 36.40 (L) 10/10/2018   LDLCALC 115 (H) 10/10/2018   ALT 59 (H) 01/29/2020   AST 47 (H) 01/29/2020  NA 142 05/16/2020   K 3.9 05/16/2020   CL 103 05/16/2020   CREATININE 0.74 05/16/2020   BUN 11 05/16/2020   CO2 28 05/16/2020   TSH 1.90 01/29/2020   HGBA1C 7.6 (H) 05/16/2020   MICROALBUR <0.7 10/10/2018    US BREAST LTD UNI LEFT INC AXILLA  Addendum Date: 08/30/2020   ADDENDUM REPORT: 08/30/2020 12:03 ADDENDUM: Recommendation should say six-month follow-up mammogram and ultrasound left breast. Electronically Signed   By: Abelardo Diesel M.D.   On: 08/30/2020 12:03   Result Date: 08/30/2020 CLINICAL DATA:  Callback from screening mammogram for possible asymmetry left breast EXAM: DIGITAL DIAGNOSTIC left MAMMOGRAM WITH TOMO ULTRASOUND LEFT BREAST COMPARISON:  Prior films ACR Breast Density Category b: There are scattered areas of fibroglandular density. FINDINGS: Spot compression cc MLO views of the left breast are submitted. Previously questioned asymmetry is persistent on spot compression left cc view. Targeted ultrasound is performed, showing probable complicated cyst at the left breast 7 o'clock retroareolar region measuring 3 mm likely correlating to the mammographic finding. IMPRESSION: Probable benign findings. RECOMMENDATION: Six-month follow-up mammogram and left breast. I have discussed the findings and recommendations with the patient. If applicable, a reminder letter will be sent to the patient regarding the next appointment. BI-RADS CATEGORY  3: Probably benign. Electronically Signed: By: Abelardo Diesel M.D. On: 08/30/2020 09:09   MM DIAG BREAST TOMO UNI LEFT  Addendum Date: 08/30/2020   ADDENDUM REPORT: 08/30/2020 12:03 ADDENDUM: Recommendation should say six-month follow-up mammogram and ultrasound left breast. Electronically Signed   By: Abelardo Diesel M.D.   On:  08/30/2020 12:03   Result Date: 08/30/2020 CLINICAL DATA:  Callback from screening mammogram for possible asymmetry left breast EXAM: DIGITAL DIAGNOSTIC left MAMMOGRAM WITH TOMO ULTRASOUND LEFT BREAST COMPARISON:  Prior films ACR Breast Density Category b: There are scattered areas of fibroglandular density. FINDINGS: Spot compression cc MLO views of the left breast are submitted. Previously questioned asymmetry is persistent on spot compression left cc view. Targeted ultrasound is performed, showing probable complicated cyst at the left breast 7 o'clock retroareolar region measuring 3 mm likely correlating to the mammographic finding. IMPRESSION: Probable benign findings. RECOMMENDATION: Six-month follow-up mammogram and left breast. I have discussed the findings and recommendations with the patient. If applicable, a reminder letter will be sent to the patient regarding the next appointment. BI-RADS CATEGORY  3: Probably benign. Electronically Signed: By: Abelardo Diesel M.D. On: 08/30/2020 09:09    Assessment & Plan:   Jaqlyn was seen today for follow-up.  Diagnoses and all orders for this visit:  HYPERTENSION -     Comprehensive metabolic panel; Future -     Microalbumin / creatinine urine ratio; Future  Type 2 diabetes mellitus without complication, without long-term current use of insulin (HCC) -     Microalbumin / creatinine urine ratio; Future -     Hemoglobin A1c; Future  Edema, unspecified type  Urticaria  Other orders -     blood glucose meter kit and supplies; Dispense based on patient and insurance preference. Use up to four times daily as directed. (FOR ICD-10 E10.9, E11.9).     Meds ordered this encounter  Medications  . blood glucose meter kit and supplies    Sig: Dispense based on patient and insurance preference. Use up to four times daily as directed. (FOR ICD-10 E10.9, E11.9).    Dispense:  1 each    Refill:  0    Order Specific Question:   Number of strips  Answer:   48    Order Specific Question:   Number of lancets    Answer:   50     Follow-up: Return in about 3 months (around 12/15/2020) for a follow-up visit.  Walker Kehr, MD

## 2020-09-16 NOTE — Assessment & Plan Note (Signed)
No hives

## 2020-09-16 NOTE — Addendum Note (Signed)
Addended by: [name] on: 09/16/2020 09:31 AM   Modules accepted: Orders

## 2020-09-16 NOTE — Assessment & Plan Note (Addendum)
A1c Check urine microalbumin Pneumovax

## 2020-09-16 NOTE — Addendum Note (Signed)
Addended by: Raliegh Ip on: 09/16/2020 08:56 AM   Modules accepted: Orders

## 2020-09-16 NOTE — Assessment & Plan Note (Signed)
BP OK at home on Amlodipine

## 2020-09-16 NOTE — Assessment & Plan Note (Signed)
No edema now

## 2020-09-17 ENCOUNTER — Other Ambulatory Visit: Payer: Self-pay | Admitting: Obstetrics and Gynecology

## 2020-09-17 DIAGNOSIS — N6489 Other specified disorders of breast: Secondary | ICD-10-CM

## 2020-11-01 ENCOUNTER — Other Ambulatory Visit: Payer: Self-pay | Admitting: Internal Medicine

## 2020-12-16 ENCOUNTER — Other Ambulatory Visit: Payer: Self-pay | Admitting: Internal Medicine

## 2020-12-30 LAB — BASIC METABOLIC PANEL: Glucose: 105

## 2020-12-30 LAB — LIPID PANEL
Cholesterol: 144 (ref 0–200)
HDL: 40 (ref 35–70)
LDL Cholesterol: 89
Triglycerides: 73 (ref 40–160)

## 2020-12-30 LAB — TSH: TSH: 1.58 (ref 0.41–5.90)

## 2021-01-14 ENCOUNTER — Encounter: Payer: Self-pay | Admitting: Internal Medicine

## 2021-01-14 ENCOUNTER — Telehealth: Payer: Medicare Other | Admitting: Internal Medicine

## 2021-01-14 ENCOUNTER — Other Ambulatory Visit: Payer: Self-pay

## 2021-01-23 ENCOUNTER — Telehealth: Payer: Medicare Other | Admitting: Internal Medicine

## 2021-02-04 ENCOUNTER — Ambulatory Visit (INDEPENDENT_AMBULATORY_CARE_PROVIDER_SITE_OTHER): Payer: Medicare Other | Admitting: Internal Medicine

## 2021-02-04 ENCOUNTER — Encounter: Payer: Self-pay | Admitting: Internal Medicine

## 2021-02-04 ENCOUNTER — Other Ambulatory Visit: Payer: Self-pay

## 2021-02-04 DIAGNOSIS — E119 Type 2 diabetes mellitus without complications: Secondary | ICD-10-CM

## 2021-02-04 DIAGNOSIS — G4700 Insomnia, unspecified: Secondary | ICD-10-CM

## 2021-02-04 DIAGNOSIS — I10 Essential (primary) hypertension: Secondary | ICD-10-CM | POA: Diagnosis not present

## 2021-02-04 DIAGNOSIS — M65311 Trigger thumb, right thumb: Secondary | ICD-10-CM

## 2021-02-04 NOTE — Progress Notes (Signed)
Subjective:  Patient ID: Renee Mann, female    DOB: 1955/11/04  Age: 65 y.o. MRN: [id number]  CC: Follow-up (4 month f/u)   HPI Renee Mann presents for DM, HTN C/o insomnia - poor sleep  Outpatient Medications Prior to Visit  Medication Sig Dispense Refill   ACCU-CHEK GUIDE test strip USE UP TO 4 TIMES A DAY AS DIRECTED 100 each 0   Accu-Chek Softclix Lancets lancets USE UP TO 4 TIMES DAILY AS DIRECTED 100 each 0   amLODipine (NORVASC) 2.5 MG tablet Take 1 tablet (2.5 mg total) by mouth daily. 90 tablet 3   blood glucose meter kit and supplies Dispense based on patient and insurance preference. Use up to four times daily as directed. (FOR ICD-10 E10.9, E11.9). 1 each 0   Blood Glucose Monitoring Suppl (ACCU-CHEK AVIVA PLUS) w/Device KIT Use to check blood sugar daily. DX E11.9 1 kit 0   diclofenac (VOLTAREN) 0.1 % ophthalmic solution 4 (four) times daily.     DULoxetine (CYMBALTA) 30 MG capsule Take 1 capsule (30 mg total) by mouth daily. 30 capsule 3   EQ ALLERGY RELIEF 10 MG tablet Take 1 tablet by mouth once daily 100 tablet 3   levocetirizine (XYZAL) 5 MG tablet Take 1 tablet by mouth twice daily 60 tablet 2   meloxicam (MOBIC) 15 MG tablet Take 15 mg by mouth daily as needed.     metFORMIN (GLUCOPHAGE-XR) 750 MG 24 hr tablet Take 1 tablet by mouth once daily with breakfast 90 tablet 2   repaglinide (PRANDIN) 1 MG tablet Take 1 tablet (1 mg total) by mouth 3 (three) times daily before meals. 90 tablet 11   benzonatate (TESSALON) 200 MG capsule Take 1 capsule (200 mg total) by mouth 2 (two) times daily as needed for cough. (Patient not taking: Reported on 02/04/2021) 30 capsule 1   clotrimazole-betamethasone (LOTRISONE) cream Apply 1 application topically 2 (two) times daily. X 1 wk only (Patient not taking: Reported on 02/04/2021) 30 g 0   EPINEPHrine (EPIPEN 2-PAK) 0.3 mg/0.3 mL IJ SOAJ injection Use as directed for severe allergic reaction 1 each 1   famotidine (PEPCID) 40 MG  tablet Take 1 tablet (40 mg total) by mouth daily. (Patient not taking: No sig reported) 90 tablet 3   gabapentin (NEURONTIN) 300 MG capsule Take 1 capsule (300 mg total) by mouth 3 (three) times daily. (Patient not taking: No sig reported) 90 capsule 3   glucose blood (ONE TOUCH ULTRA TEST) test strip 1 each by Other route daily. Use to check blood sugars once a day (Patient not taking: Reported on 09/16/2020) 100 each 12   glucose blood test strip Use 1 times a day PRN. DX: E11.9 100 each 3   glucose blood test strip USE TO CHECK BLOOD SUGAR DAILY 50 each 3   omalizumab (XOLAIR) 150 MG injection INJECT 300MG SUBCUTANEOUSLY EVERY 28 DAYS (GIVEN AT MD  OFFICE) 2 each 11   tiZANidine (ZANAFLEX) 4 MG tablet Take 1 tablet (4 mg total) by mouth every 8 (eight) hours as needed for muscle spasms. (Patient not taking: No sig reported) 30 tablet 1   Triamcinolone Acetonide (TRIAMCINOLONE 0.1 % CREAM : EUCERIN) CREA Apply 1 application topically 2 (two) times daily. (Patient not taking: Reported on 02/04/2021) 1 each 2   omalizumab Arvid Right) injection 300 mg      No facility-administered medications prior to visit.    ROS: Review of Systems  Constitutional:  Negative for activity change,  appetite change, chills, diaphoresis, fatigue, fever and unexpected weight change.  HENT:  Negative for congestion, dental problem, ear pain, hearing loss, mouth sores, postnasal drip, sinus pressure, sneezing, sore throat and voice change.   Eyes:  Negative for pain and visual disturbance.  Respiratory:  Negative for cough, chest tightness, wheezing and stridor.   Cardiovascular:  Negative for chest pain, palpitations and leg swelling.  Gastrointestinal:  Negative for abdominal distention, abdominal pain, blood in stool, nausea, rectal pain and vomiting.  Genitourinary:  Negative for decreased urine volume, difficulty urinating, dysuria, frequency, hematuria, menstrual problem, vaginal bleeding, vaginal discharge and  vaginal pain.  Musculoskeletal:  Positive for arthralgias. Negative for back pain, gait problem, joint swelling and neck pain.  Skin:  Negative for color change, rash and wound.  Neurological:  Negative for dizziness, tremors, syncope, speech difficulty, weakness and light-headedness.  Hematological:  Negative for adenopathy.  Psychiatric/Behavioral:  Positive for sleep disturbance. Negative for behavioral problems, confusion, decreased concentration, dysphoric mood, hallucinations and suicidal ideas. The patient is not nervous/anxious and is not hyperactive.    Objective:  BP (!) 142/80 (BP Location: Left Arm)   Pulse 75   Temp 98.3 F (36.8 C) (Oral)   Ht 4' 11"  (1.499 m)   Wt 150 lb 3.2 oz (68.1 kg)   SpO2 97%   BMI 30.34 kg/m   BP Readings from Last 3 Encounters:  02/04/21 (!) 142/80  09/16/20 (!) 160/80  05/16/20 124/82    Wt Readings from Last 3 Encounters:  02/04/21 150 lb 3.2 oz (68.1 kg)  09/16/20 154 lb 6.4 oz (70 kg)  05/16/20 147 lb (66.7 kg)    Physical Exam Constitutional:      General: She is not in acute distress.    Appearance: She is well-developed. She is obese.  HENT:     Head: Normocephalic.     Right Ear: External ear normal.     Left Ear: External ear normal.     Nose: Nose normal.  Eyes:     General:        Right eye: No discharge.        Left eye: No discharge.     Conjunctiva/sclera: Conjunctivae normal.     Pupils: Pupils are equal, round, and reactive to light.  Neck:     Thyroid: No thyromegaly.     Vascular: No JVD.     Trachea: No tracheal deviation.  Cardiovascular:     Rate and Rhythm: Normal rate and regular rhythm.     Heart sounds: Normal heart sounds.  Pulmonary:     Effort: No respiratory distress.     Breath sounds: No stridor. No wheezing.  Abdominal:     General: Bowel sounds are normal. There is no distension.     Palpations: Abdomen is soft. There is no mass.     Tenderness: There is no abdominal tenderness. There is  no guarding or rebound.  Musculoskeletal:        General: Tenderness present.     Cervical back: Normal range of motion and neck supple. No rigidity.  Lymphadenopathy:     Cervical: No cervical adenopathy.  Skin:    Findings: No erythema or rash.  Neurological:     Cranial Nerves: No cranial nerve deficit.     Motor: No abnormal muscle tone.     Coordination: Coordination normal.     Deep Tendon Reflexes: Reflexes normal.  Psychiatric:        Behavior: Behavior normal.  Thought Content: Thought content normal.        Judgment: Judgment normal.   R palmar thumb - tender nodule  Lab Results  Component Value Date   WBC 6.2 01/29/2020   HGB 13.0 01/29/2020   HCT 39.3 01/29/2020   PLT 394.0 01/29/2020   GLUCOSE 123 (H) 09/16/2020   CHOL 144 12/30/2020   TRIG 73 12/30/2020   HDL 40 12/30/2020   LDLCALC 89 12/30/2020   ALT 19 09/16/2020   AST 16 09/16/2020   NA 140 09/16/2020   K 3.9 09/16/2020   CL 102 09/16/2020   CREATININE 0.71 09/16/2020   BUN 11 09/16/2020   CO2 29 09/16/2020   TSH 1.58 12/30/2020   HGBA1C 7.8 (H) 09/16/2020   MICROALBUR 1.6 09/16/2020    US BREAST LTD UNI LEFT INC AXILLA  Addendum Date: 08/30/2020   ADDENDUM REPORT: 08/30/2020 12:03 ADDENDUM: Recommendation should say six-month follow-up mammogram and ultrasound left breast. Electronically Signed   By: Abelardo Diesel M.D.   On: 08/30/2020 12:03   Result Date: 08/30/2020 CLINICAL DATA:  Callback from screening mammogram for possible asymmetry left breast EXAM: DIGITAL DIAGNOSTIC left MAMMOGRAM WITH TOMO ULTRASOUND LEFT BREAST COMPARISON:  Prior films ACR Breast Density Category b: There are scattered areas of fibroglandular density. FINDINGS: Spot compression cc MLO views of the left breast are submitted. Previously questioned asymmetry is persistent on spot compression left cc view. Targeted ultrasound is performed, showing probable complicated cyst at the left breast 7 o'clock retroareolar region  measuring 3 mm likely correlating to the mammographic finding. IMPRESSION: Probable benign findings. RECOMMENDATION: Six-month follow-up mammogram and left breast. I have discussed the findings and recommendations with the patient. If applicable, a reminder letter will be sent to the patient regarding the next appointment. BI-RADS CATEGORY  3: Probably benign. Electronically Signed: By: Abelardo Diesel M.D. On: 08/30/2020 09:09   MM DIAG BREAST TOMO UNI LEFT  Addendum Date: 08/30/2020   ADDENDUM REPORT: 08/30/2020 12:03 ADDENDUM: Recommendation should say six-month follow-up mammogram and ultrasound left breast. Electronically Signed   By: Abelardo Diesel M.D.   On: 08/30/2020 12:03   Result Date: 08/30/2020 CLINICAL DATA:  Callback from screening mammogram for possible asymmetry left breast EXAM: DIGITAL DIAGNOSTIC left MAMMOGRAM WITH TOMO ULTRASOUND LEFT BREAST COMPARISON:  Prior films ACR Breast Density Category b: There are scattered areas of fibroglandular density. FINDINGS: Spot compression cc MLO views of the left breast are submitted. Previously questioned asymmetry is persistent on spot compression left cc view. Targeted ultrasound is performed, showing probable complicated cyst at the left breast 7 o'clock retroareolar region measuring 3 mm likely correlating to the mammographic finding. IMPRESSION: Probable benign findings. RECOMMENDATION: Six-month follow-up mammogram and left breast. I have discussed the findings and recommendations with the patient. If applicable, a reminder letter will be sent to the patient regarding the next appointment. BI-RADS CATEGORY  3: Probably benign. Electronically Signed: By: Abelardo Diesel M.D. On: 08/30/2020 09:09    Assessment & Plan:   There are no diagnoses linked to this encounter.   No orders of the defined types were placed in this encounter.    Follow-up: No follow-ups on file.  Walker Kehr, MD

## 2021-02-04 NOTE — Assessment & Plan Note (Signed)
Check BP at home: elevated BP today Cont w/Norvasc

## 2021-02-04 NOTE — Assessment & Plan Note (Signed)
Worse Options discussed.  Try Valerian root

## 2021-02-04 NOTE — Assessment & Plan Note (Signed)
Cont on Metformin and Prandin. Cont w/wt loss

## 2021-02-04 NOTE — Assessment & Plan Note (Signed)
New TRIGGER THUMB SPLINT, Voltaren gel

## 2021-02-04 NOTE — Patient Instructions (Signed)
Try Valerian root for insomnia   TRIGGER THUMB SPLINT

## 2021-03-03 ENCOUNTER — Other Ambulatory Visit: Payer: Self-pay

## 2021-03-03 ENCOUNTER — Ambulatory Visit
Admission: RE | Admit: 2021-03-03 | Discharge: 2021-03-03 | Disposition: A | Discharge status: Home / Self Care | Payer: Medicare Other | Source: Ambulatory Visit | Attending: Obstetrics and Gynecology | Admitting: Obstetrics and Gynecology

## 2021-03-03 DIAGNOSIS — N6489 Other specified disorders of breast: Secondary | ICD-10-CM

## 2021-03-11 ENCOUNTER — Other Ambulatory Visit: Payer: Self-pay | Admitting: Internal Medicine

## 2021-03-13 ENCOUNTER — Telehealth: Payer: Self-pay | Admitting: Internal Medicine

## 2021-03-13 MED ORDER — ACCU-CHEK SOFTCLIX LANCETS MISC: 3 refills | Status: DC

## 2021-03-13 NOTE — Addendum Note (Signed)
Addended by: Earnstine Regal on: 03/13/2021 02:12 PM   Modules accepted: Orders

## 2021-03-20 ENCOUNTER — Other Ambulatory Visit: Payer: Self-pay

## 2021-03-20 MED ORDER — ACCU-CHEK SOFTCLIX LANCETS MISC: 3 refills | Status: DC

## 2021-03-24 MED ORDER — ACCU-CHEK SOFTCLIX LANCETS MISC: 3 refills | Status: DC

## 2021-03-24 NOTE — Telephone Encounter (Signed)
Resent rx w/ diagnosis.Marland KitchenJohny Mann

## 2021-03-24 NOTE — Telephone Encounter (Signed)
   Patient calling states pharmacy needs diagnosis code for lancets

## 2021-03-24 NOTE — Addendum Note (Signed)
Addended by: Earnstine Regal on: 03/24/2021 09:19 AM   Modules accepted: Orders

## 2021-03-25 MED ORDER — ACCU-CHEK GUIDE VI STRP: ORAL_STRIP | 3 refills | Status: DC

## 2021-03-25 NOTE — Telephone Encounter (Signed)
Resent rx w/ dx code to [facility]./lmb

## 2021-03-25 NOTE — Telephone Encounter (Signed)
Patient called in stating that pharmacy needs diagnosis code for test strips...was able to get lancets but they are still needing code for test strips

## 2021-03-25 NOTE — Addendum Note (Signed)
Addended by: Earnstine Regal on: 03/25/2021 08:49 AM   Modules accepted: Orders

## 2021-03-27 ENCOUNTER — Other Ambulatory Visit: Payer: Self-pay | Admitting: Internal Medicine

## 2021-04-07 ENCOUNTER — Other Ambulatory Visit: Payer: Self-pay | Admitting: Internal Medicine

## 2021-04-11 ENCOUNTER — Other Ambulatory Visit: Payer: Self-pay

## 2021-04-11 DIAGNOSIS — E119 Type 2 diabetes mellitus without complications: Secondary | ICD-10-CM

## 2021-04-11 MED ORDER — ACCU-CHEK SOFTCLIX LANCETS MISC: 3 refills | Status: DC

## 2021-05-07 ENCOUNTER — Encounter: Payer: Self-pay | Admitting: Internal Medicine

## 2021-05-07 ENCOUNTER — Ambulatory Visit (INDEPENDENT_AMBULATORY_CARE_PROVIDER_SITE_OTHER): Payer: Medicare Other | Admitting: Internal Medicine

## 2021-05-07 ENCOUNTER — Other Ambulatory Visit: Payer: Self-pay

## 2021-05-07 DIAGNOSIS — I10 Essential (primary) hypertension: Secondary | ICD-10-CM | POA: Diagnosis not present

## 2021-05-07 DIAGNOSIS — M5431 Sciatica, right side: Secondary | ICD-10-CM

## 2021-05-07 DIAGNOSIS — E119 Type 2 diabetes mellitus without complications: Secondary | ICD-10-CM | POA: Diagnosis not present

## 2021-05-07 LAB — BASIC METABOLIC PANEL
BUN: 9 mg/dL (ref 6–23)
CO2: 28 mEq/L (ref 19–32)
Calcium: 9.8 mg/dL (ref 8.4–10.5)
Chloride: 103 mEq/L (ref 96–112)
Creatinine, Ser: 0.72 mg/dL (ref 0.40–1.20)
GFR: 86.95 mL/min (ref >60.00)
Glucose, Bld: 105 mg/dL — ABNORMAL HIGH (ref 70–99)
Potassium: 3.9 mEq/L (ref 3.5–5.1)
Sodium: 140 mEq/L (ref 135–145)

## 2021-05-07 LAB — HEMOGLOBIN A1C: Hgb A1c MFr Bld: 7.3 % — ABNORMAL HIGH (ref 4.6–6.5)

## 2021-05-07 MED ORDER — MELOXICAM 15 MG PO TABS: 15.0000 mg | ORAL_TABLET | Freq: Every day | ORAL | 1 refills | Status: DC | PRN

## 2021-05-07 NOTE — Progress Notes (Signed)
Subjective:  Patient ID: Renee Mann, female    DOB: 1956/05/09  Age: 65 y.o. MRN: [id number]  CC: Follow-up (3 month f/u)   HPI Renee Mann presents for M, HTN, OA   Outpatient Medications Prior to Visit  Medication Sig Dispense Refill   Accu-Chek Softclix Lancets lancets USE TO CHECK BLOOD SUGAR DAILY 100 each 3   amLODipine (NORVASC) 2.5 MG tablet Take 1 tablet (2.5 mg total) by mouth daily. 90 tablet 3   blood glucose meter kit and supplies Dispense based on patient and insurance preference. Use up to four times daily as directed. (FOR ICD-10 E10.9, E11.9). 1 each 0   Blood Glucose Monitoring Suppl (ACCU-CHEK AVIVA PLUS) w/Device KIT Use to check blood sugar daily. DX E11.9 1 kit 0   EQ ALLERGY RELIEF 10 MG tablet Take 1 tablet by mouth once daily 100 tablet 3   glucose blood (ACCU-CHEK GUIDE) test strip USE TO CHECK BLOOD SUGARS ONCE A DAY 90 strip 3   levocetirizine (XYZAL) 5 MG tablet Take 1 tablet by mouth twice daily 60 tablet 2   meloxicam (MOBIC) 15 MG tablet Take 15 mg by mouth daily as needed.     metFORMIN (GLUCOPHAGE-XR) 750 MG 24 hr tablet Take 1 tablet by mouth once daily with breakfast 90 tablet 2   repaglinide (PRANDIN) 1 MG tablet TAKE 1 TABLET BY MOUTH THREE TIMES DAILY BEFORE MEAL(S) 90 tablet 5   diclofenac (VOLTAREN) 0.1 % ophthalmic solution 4 (four) times daily.     DULoxetine (CYMBALTA) 30 MG capsule Take 1 capsule (30 mg total) by mouth daily. 30 capsule 3   No facility-administered medications prior to visit.    ROS: Review of Systems  Constitutional:  Negative for activity change, appetite change, chills, fatigue and unexpected weight change.  HENT:  Negative for congestion, mouth sores and sinus pressure.   Eyes:  Negative for visual disturbance.  Respiratory:  Negative for cough and chest tightness.   Gastrointestinal:  Negative for abdominal pain and nausea.  Genitourinary:  Negative for difficulty urinating, frequency and vaginal pain.   Musculoskeletal:  Positive for arthralgias and back pain. Negative for gait problem.  Skin:  Negative for pallor and rash.  Neurological:  Negative for dizziness, tremors, weakness, numbness and headaches.  Psychiatric/Behavioral:  Negative for confusion and sleep disturbance.    Objective:  BP (!) 142/80 (BP Location: Left Arm)   Pulse 83   Temp 98 F (36.7 C) (Oral)   Ht 4' 11"  (1.499 m)   Wt 147 lb 12.8 oz (67 kg)   SpO2 97%   BMI 29.85 kg/m   BP Readings from Last 3 Encounters:  05/07/21 (!) 142/80  02/04/21 (!) 142/80  09/16/20 (!) 160/80    Wt Readings from Last 3 Encounters:  05/07/21 147 lb 12.8 oz (67 kg)  02/04/21 150 lb 3.2 oz (68.1 kg)  09/16/20 154 lb 6.4 oz (70 kg)    Physical Exam Constitutional:      General: She is not in acute distress.    Appearance: Normal appearance. She is well-developed.  HENT:     Head: Normocephalic.     Right Ear: External ear normal.     Left Ear: External ear normal.     Nose: Nose normal.  Eyes:     General:        Right eye: No discharge.        Left eye: No discharge.     Conjunctiva/sclera: Conjunctivae normal.  Pupils: Pupils are equal, round, and reactive to light.  Neck:     Thyroid: No thyromegaly.     Vascular: No JVD.     Trachea: No tracheal deviation.  Cardiovascular:     Rate and Rhythm: Normal rate and regular rhythm.     Heart sounds: Normal heart sounds.  Pulmonary:     Effort: No respiratory distress.     Breath sounds: No stridor. No wheezing.  Abdominal:     General: Bowel sounds are normal. There is no distension.     Palpations: Abdomen is soft. There is no mass.     Tenderness: There is no abdominal tenderness. There is no guarding or rebound.  Musculoskeletal:        General: Tenderness present.     Cervical back: Normal range of motion and neck supple. No rigidity.  Lymphadenopathy:     Cervical: No cervical adenopathy.  Skin:    Findings: No erythema or rash.  Neurological:      Cranial Nerves: No cranial nerve deficit.     Motor: No abnormal muscle tone.     Coordination: Coordination normal.     Deep Tendon Reflexes: Reflexes normal.  Psychiatric:        Behavior: Behavior normal.        Thought Content: Thought content normal.        Judgment: Judgment normal.  LS w/stiffness  Lab Results  Component Value Date   WBC 6.2 01/29/2020   HGB 13.0 01/29/2020   HCT 39.3 01/29/2020   PLT 394.0 01/29/2020   GLUCOSE 123 (H) 09/16/2020   CHOL 144 12/30/2020   TRIG 73 12/30/2020   HDL 40 12/30/2020   LDLCALC 89 12/30/2020   ALT 19 09/16/2020   AST 16 09/16/2020   NA 140 09/16/2020   K 3.9 09/16/2020   CL 102 09/16/2020   CREATININE 0.71 09/16/2020   BUN 11 09/16/2020   CO2 29 09/16/2020   TSH 1.58 12/30/2020   HGBA1C 7.8 (H) 09/16/2020   MICROALBUR 1.6 09/16/2020    US BREAST LTD UNI LEFT INC AXILLA  Addendum Date: 03/03/2021   ADDENDUM REPORT: 03/03/2021 13:35 ADDENDUM: Dictation error in the RECOMMENDATION section. Next bilateral screening mammogram will be due in November of 2022. Electronically Signed   By: Franki Cabot M.D.   On: 03/03/2021 13:35   Result Date: 03/03/2021 CLINICAL DATA:  Follow-up for probably benign complicated cyst in the LEFT breast. EXAM: DIGITAL DIAGNOSTIC UNILATERAL LEFT MAMMOGRAM WITH TOMOSYNTHESIS AND CAD; ULTRASOUND LEFT BREAST LIMITED TECHNIQUE: Left digital diagnostic mammography and breast tomosynthesis was performed. The images were evaluated with computer-aided detection.; Targeted ultrasound examination of the left breast was performed COMPARISON:  Previous exams including diagnostic mammogram and ultrasound dated 08/30/2020. ACR Breast Density Category b: There are scattered areas of fibroglandular density. FINDINGS: There are no dominant masses, suspicious calcifications or secondary signs of malignancy identified in the LEFT breast on today's exam. The previously questioned asymmetry within the LEFT breast is no longer  identified suggesting superimposition of normal fibroglandular tissues. Targeted ultrasound is performed, showing only normal fibroglandular tissues and fat lobules. The previously demonstrated mass at the 7 o'clock axis, retroareolar, is no longer seen confirming benignity, presumably a resolved cyst. IMPRESSION: No evidence of malignancy. Resolved cyst within the retroareolar LEFT breast. Patient may return to routine annual bilateral screening mammogram schedule. RECOMMENDATION: Screening mammogram in one year.(Code:SM-B-01Y) I have discussed the findings and recommendations with the patient. If applicable, a reminder letter will be  sent to the patient regarding the next appointment. BI-RADS CATEGORY  1: Negative. Electronically Signed: By: Franki Cabot M.D. On: 03/03/2021 09:10  MM DIAG BREAST TOMO UNI LEFT  Addendum Date: 03/03/2021   ADDENDUM REPORT: 03/03/2021 13:35 ADDENDUM: Dictation error in the RECOMMENDATION section. Next bilateral screening mammogram will be due in November of 2022. Electronically Signed   By: Franki Cabot M.D.   On: 03/03/2021 13:35   Result Date: 03/03/2021 CLINICAL DATA:  Follow-up for probably benign complicated cyst in the LEFT breast. EXAM: DIGITAL DIAGNOSTIC UNILATERAL LEFT MAMMOGRAM WITH TOMOSYNTHESIS AND CAD; ULTRASOUND LEFT BREAST LIMITED TECHNIQUE: Left digital diagnostic mammography and breast tomosynthesis was performed. The images were evaluated with computer-aided detection.; Targeted ultrasound examination of the left breast was performed COMPARISON:  Previous exams including diagnostic mammogram and ultrasound dated 08/30/2020. ACR Breast Density Category b: There are scattered areas of fibroglandular density. FINDINGS: There are no dominant masses, suspicious calcifications or secondary signs of malignancy identified in the LEFT breast on today's exam. The previously questioned asymmetry within the LEFT breast is no longer identified suggesting superimposition  of normal fibroglandular tissues. Targeted ultrasound is performed, showing only normal fibroglandular tissues and fat lobules. The previously demonstrated mass at the 7 o'clock axis, retroareolar, is no longer seen confirming benignity, presumably a resolved cyst. IMPRESSION: No evidence of malignancy. Resolved cyst within the retroareolar LEFT breast. Patient may return to routine annual bilateral screening mammogram schedule. RECOMMENDATION: Screening mammogram in one year.(Code:SM-[id number]) I have discussed the findings and recommendations with the patient. If applicable, a reminder letter will be sent to the patient regarding the next appointment. BI-RADS CATEGORY  1: Negative. Electronically Signed: By: Franki Cabot M.D. On: 03/03/2021 09:10   Assessment & Plan:   There are no diagnoses linked to this encounter.   No orders of the defined types were placed in this encounter.    Follow-up: No follow-ups on file.  Walker Kehr, MD

## 2021-05-07 NOTE — Assessment & Plan Note (Signed)
BP OK at home on Amlodipine

## 2021-05-07 NOTE — Assessment & Plan Note (Signed)
Cont on Metformin and Prandin. Cont w/wt loss

## 2021-05-07 NOTE — Assessment & Plan Note (Signed)
Using OTC meds - Tylenol prn Rx - Meloxicam prn

## 2021-05-07 NOTE — Addendum Note (Signed)
Addended by: Boris Lown B on: 05/07/2021 08:17 AM   Modules accepted: Orders

## 2021-06-03 ENCOUNTER — Other Ambulatory Visit: Payer: Self-pay | Admitting: Internal Medicine

## 2021-06-28 ENCOUNTER — Other Ambulatory Visit: Payer: Self-pay | Admitting: Internal Medicine

## 2021-07-11 ENCOUNTER — Other Ambulatory Visit: Payer: Self-pay | Admitting: Internal Medicine

## 2021-08-23 ENCOUNTER — Other Ambulatory Visit: Payer: Self-pay | Admitting: Internal Medicine

## 2021-08-30 ENCOUNTER — Ambulatory Visit
Admission: EM | Admit: 2021-08-30 | Discharge: 2021-08-30 | Disposition: A | Discharge status: Home / Self Care | Payer: Medicare Other

## 2021-08-30 ENCOUNTER — Other Ambulatory Visit: Payer: Self-pay

## 2021-08-30 DIAGNOSIS — J069 Acute upper respiratory infection, unspecified: Secondary | ICD-10-CM

## 2021-08-30 NOTE — ED Provider Notes (Signed)
[name]    CSN: [id number] Arrival date & time: 08/30/21  1039      History   Chief Complaint Chief Complaint  Patient presents with   Nasal Congestion    HPI Renee Mann is a 66 y.o. female.   Patient here c/w nasal congestion x 3 days.  She reports she ahd chills last week, but that resolved.  She admits nasal congestion, rhinorrhea, sore throat, cough.  Denies f/c, n/v/d, abdominal pain, wheezing, SOB.  No advil or tylenol today.   She reports BP always elevated at doctors offices, at home she checks it and states it's 130's/80's.     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    Patient Active Problem List   Diagnosis Date Noted   Trigger thumb of right hand 02/04/2021   Edema 05/16/2020   Fatigue 01/29/2020   Neck muscle spasm 07/28/2019   Lumbar radiculopathy, right 05/08/2019   Sciatica 04/11/2019   Tachycardia 10/10/2018   Hand cramps 04/11/2018   Hand swelling 01/22/2017   Rash 12/15/2016   Urticaria 08/04/2016   Neoplasm of uncertain behavior of skin 04/17/2016   Gout 03/20/2015   Well adult exam 03/20/2015   Greater trochanteric bursitis of right hip 05/17/2013   Hip pain 01/06/2013   GERD 07/16/2010   Upper respiratory infection 11/28/2009   OSTEOARTHRITIS 03/27/2008   INSOMNIA, PERSISTENT 07/13/2007   HYPERTENSION 07/13/2007   Diabetes mellitus, type II ([place]) 06/20/2007    Past Surgical History:  Procedure Laterality Date   ABDOMINAL HYSTERECTOMY  1987   burritis surgery Left 1998   hip    OB History   No obstetric history on file.      Home Medications    Prior to Admission medications   Medication Sig Start Date End Date Taking? Authorizing Provider  Accu-Chek Softclix Lancets lancets USE TO CHECK BLOOD SUGAR DAILY 04/11/21   Plotnikov, [name], MD  amLODipine  (NORVASC) 2.5 MG tablet Take 1 tablet (2.5 mg total) by mouth daily. 05/16/20   Plotnikov, [name], MD  amLODipine (NORVASC) 5 MG tablet TAKE ONE TABLET BY MOUTH DAILY 07/15/21   Plotnikov, [name], MD  blood glucose meter kit and supplies Dispense based on patient and insurance preference. Use up to four times daily as directed. (FOR ICD-10 E10.9, E11.9). 09/16/20   Plotnikov, [name], MD  Blood Glucose Monitoring Suppl (ACCU-CHEK AVIVA PLUS) w/Device KIT Use to check blood sugar daily. DX E11.9 10/10/18   Plotnikov, [name], MD  EQ ALLERGY RELIEF 10 MG tablet Take 1 tablet by mouth once daily 11/09/18   Plotnikov, [name], MD  glucose blood (ACCU-CHEK GUIDE) test strip USE TO CHECK BLOOD SUGARS ONCE A DAY 03/27/21   Plotnikov, [name], MD  levocetirizine (XYZAL) 5 MG tablet Take 1 tablet by mouth twice daily 05/13/20   Kozlow, Donnamarie Poag, MD  meloxicam (MOBIC) 15 MG tablet TAKE 1 TABLET BY MOUTH EVERY DAY AS NEEDED FOR PAIN 08/23/21   Plotnikov, [name], MD  metFORMIN (GLUCOPHAGE-XR) 750 MG 24 hr tablet Take 1 tablet by mouth once daily with breakfast 11/01/20   Plotnikov, [name], MD  repaglinide (PRANDIN) 1 MG tablet TAKE 1 TABLET BY MOUTH 3 TIMES DAILY BEFORE MEAL(S) 06/04/21   Plotnikov,  [name], MD    Family History Family History  Problem Relation Age of Onset   Hypertension Mother    Cancer Mother    Stroke Mother    Hypertension Sister    Hypertension Other    Colon cancer Neg Hx    Esophageal cancer Neg Hx    Stomach cancer Neg Hx    Rectal cancer Neg Hx     Social History Social History   Tobacco Use   Smoking status: Former    Types: Cigarettes    Quit date: 08/24/1998    Years since quitting: 23.0   Smokeless tobacco: Never  Vaping Use   Vaping Use: Never used  Substance Use Topics   Alcohol use: No    Alcohol/week: 0.0 standard drinks   Drug use: No     Allergies   Codeine, Atenolol, and Hydrochlorothiazide w-triamterene   Review of Systems Review  of Systems  Constitutional:  Negative for chills, fatigue and fever.  HENT:  Positive for congestion, postnasal drip, rhinorrhea and sore throat. Negative for ear pain, nosebleeds, sinus pressure and sinus pain.   Eyes:  Negative for photophobia, pain, redness and visual disturbance.  Respiratory:  Positive for cough. Negative for shortness of breath and wheezing.   Cardiovascular:  Negative for chest pain, palpitations and leg swelling.  Gastrointestinal:  Negative for abdominal pain, diarrhea, nausea and vomiting.  Musculoskeletal:  Negative for arthralgias and myalgias.  Skin:  Negative for rash.  Neurological:  Negative for light-headedness and headaches.  Hematological:  Negative for adenopathy. Does not bruise/bleed easily.  Psychiatric/Behavioral:  Negative for confusion and sleep disturbance.     Physical Exam Triage Vital Signs ED Triage Vitals  Enc Vitals Group     BP 08/30/21 1116 (!) 194/83     Pulse Rate 08/30/21 1116 79     Resp 08/30/21 1116 18     Temp 08/30/21 1116 98.2 F (36.8 C)     Temp Source 08/30/21 1116 Oral     SpO2 08/30/21 1116 97 %     Weight --      Height --      Head Circumference --      Peak Flow --      Pain Score 08/30/21 1118 0     Pain Loc --      Pain Edu? --      Excl. in [place]? --    No data found.  Updated Vital Signs BP (!) 194/83 (BP Location: Right Arm) Comment: hasn't taken BP meds yet   Pulse 79    Temp 98.2 F (36.8 C) (Oral)    Resp 18    SpO2 97%   Visual Acuity Right Eye Distance:   Left Eye Distance:   Bilateral Distance:    Right Eye Near:   Left Eye Near:    Bilateral Near:     Physical Exam Vitals and nursing note reviewed.  Constitutional:      General: She is not in acute distress.    Appearance: Normal appearance. She is well-developed. She is obese. She is not ill-appearing or toxic-appearing.  HENT:     Head: Normocephalic and atraumatic.     Right Ear: Tympanic membrane and ear canal normal. No swelling  or tenderness. No middle ear effusion. Tympanic membrane is not injected, scarred, perforated, retracted or bulging.     Left Ear: Tympanic membrane and ear canal normal. No swelling or tenderness.  No middle ear effusion. Tympanic membrane is not  injected, scarred, perforated, retracted or bulging.     Nose: Mucosal edema, congestion and rhinorrhea present. Rhinorrhea is clear.     Right Turbinates: Swollen. Not enlarged.     Left Turbinates: Swollen. Not enlarged.     Right Sinus: No frontal sinus tenderness.     Left Sinus: No maxillary sinus tenderness or frontal sinus tenderness.     Mouth/Throat:     Mouth: Mucous membranes are moist.     Pharynx: Oropharynx is clear. No pharyngeal swelling, oropharyngeal exudate, posterior oropharyngeal erythema or uvula swelling.     Tonsils: No tonsillar exudate or tonsillar abscesses. 0 on the right. 0 on the left.  Eyes:     Extraocular Movements: Extraocular movements intact.     Conjunctiva/sclera: Conjunctivae normal.  Cardiovascular:     Rate and Rhythm: Normal rate and regular rhythm.     Heart sounds: No murmur heard. Pulmonary:     Effort: Pulmonary effort is normal. No respiratory distress.     Breath sounds: Normal breath sounds. No wheezing, rhonchi or rales.  Musculoskeletal:        General: Normal range of motion.     Cervical back: Normal range of motion and neck supple. No rigidity.  Lymphadenopathy:     Cervical: No cervical adenopathy.  Skin:    General: Skin is warm.     Findings: No rash.  Neurological:     General: No focal deficit present.     Mental Status: She is alert and oriented to person, place, and time.     Motor: No weakness.     Gait: Gait normal.  Psychiatric:        Mood and Affect: Mood normal.        Behavior: Behavior normal.     UC Treatments / Results  Labs (all labs ordered are listed, but only abnormal results are displayed) Labs Reviewed - No data to display  EKG   Radiology No results  found.  Procedures Procedures (including critical care time)  Medications Ordered in UC Medications - No data to display  Initial Impression / Assessment and Plan / UC Course  I have reviewed the triage vital signs and the nursing notes.  Pertinent labs & imaging results that were available during my care of the patient were reviewed by me and considered in my medical decision making (see chart for details).     URI - discussed course and duration May take coricidin or zyrtec to help with congestion Follow up with PCP Monitor BP at home Take medications as prescribed Final Clinical Impressions(s) / UC Diagnoses   Final diagnoses:  Acute upper respiratory infection     Discharge Instructions      Take zyrtec or coricidin for nasal congestion Follow up with your PCP if no improvement after 1 week.   ED Prescriptions   None    PDMP not reviewed this encounter.   Peri Jefferson, PA-C 08/30/21 1148

## 2021-08-30 NOTE — ED Triage Notes (Signed)
Greater than one week h/o cough and sinus pressure. Patient suspicious of sinus infection. Denies HA and sinus pressure. Has been taking otc meds w/o relief.

## 2021-08-30 NOTE — Discharge Instructions (Addendum)
Take zyrtec or coricidin for nasal congestion Follow up with your PCP if no improvement after 1 week.

## 2021-09-08 ENCOUNTER — Other Ambulatory Visit: Payer: Self-pay

## 2021-09-08 ENCOUNTER — Encounter: Payer: Self-pay | Admitting: Internal Medicine

## 2021-09-08 ENCOUNTER — Ambulatory Visit (INDEPENDENT_AMBULATORY_CARE_PROVIDER_SITE_OTHER): Payer: Medicare Other | Admitting: Internal Medicine

## 2021-09-08 DIAGNOSIS — E119 Type 2 diabetes mellitus without complications: Secondary | ICD-10-CM

## 2021-09-08 DIAGNOSIS — I10 Essential (primary) hypertension: Secondary | ICD-10-CM | POA: Diagnosis not present

## 2021-09-08 DIAGNOSIS — R609 Edema, unspecified: Secondary | ICD-10-CM | POA: Diagnosis not present

## 2021-09-08 DIAGNOSIS — J069 Acute upper respiratory infection, unspecified: Secondary | ICD-10-CM | POA: Diagnosis not present

## 2021-09-08 LAB — COMPREHENSIVE METABOLIC PANEL
ALT: 34 U/L (ref 0–35)
AST: 25 U/L (ref 0–37)
Albumin: 4.5 g/dL (ref 3.5–5.2)
Alkaline Phosphatase: 115 U/L (ref 39–117)
BUN: 11 mg/dL (ref 6–23)
CO2: 26 mEq/L (ref 19–32)
Calcium: 9.9 mg/dL (ref 8.4–10.5)
Chloride: 102 mEq/L (ref 96–112)
Creatinine, Ser: 0.75 mg/dL (ref 0.40–1.20)
GFR: 82.59 mL/min (ref >60.00)
Glucose, Bld: 56 mg/dL — ABNORMAL LOW (ref 70–99)
Potassium: 3.7 mEq/L (ref 3.5–5.1)
Sodium: 139 mEq/L (ref 135–145)
Total Bilirubin: 0.3 mg/dL (ref 0.2–1.2)
Total Protein: 8.1 g/dL (ref 6.0–8.3)

## 2021-09-08 LAB — HEMOGLOBIN A1C: Hgb A1c MFr Bld: 7.4 % — ABNORMAL HIGH (ref 4.6–6.5)

## 2021-09-08 MED ORDER — TRIAMCINOLONE 0.1 % CREAM:EUCERIN CREAM 1:1: 1.0000 "application " | TOPICAL_CREAM | Freq: Two times a day (BID) | CUTANEOUS | 3 refills | Status: DC | PRN

## 2021-09-08 NOTE — Assessment & Plan Note (Signed)
Cont on Norvasc 5 mg/d

## 2021-09-08 NOTE — Progress Notes (Signed)
Subjective:  Patient ID: Renee Mann, female    DOB: 12-10-1955  Age: 66 y.o. MRN: [id number]  CC: Follow-up (4 month f/u)   HPI Jamesetta A Barstow presents for HTN, DM, obesity. BP nl at home. CBG 90-120 F/u recent URI  Outpatient Medications Prior to Visit  Medication Sig Dispense Refill   Accu-Chek Softclix Lancets lancets USE TO CHECK BLOOD SUGAR DAILY 100 each 3   amLODipine (NORVASC) 5 MG tablet TAKE ONE TABLET BY MOUTH DAILY 90 tablet 2   blood glucose meter kit and supplies Dispense based on patient and insurance preference. Use up to four times daily as directed. (FOR ICD-10 E10.9, E11.9). 1 each 0   Blood Glucose Monitoring Suppl (ACCU-CHEK AVIVA PLUS) w/Device KIT Use to check blood sugar daily. DX E11.9 1 kit 0   glucose blood (ACCU-CHEK GUIDE) test strip USE TO CHECK BLOOD SUGARS ONCE A DAY 90 strip 3   meloxicam (MOBIC) 15 MG tablet TAKE 1 TABLET BY MOUTH EVERY DAY AS NEEDED FOR PAIN 30 tablet 1   metFORMIN (GLUCOPHAGE-XR) 750 MG 24 hr tablet Take 1 tablet by mouth once daily with breakfast 90 tablet 2   repaglinide (PRANDIN) 1 MG tablet TAKE 1 TABLET BY MOUTH 3 TIMES DAILY BEFORE MEAL(S) 270 tablet 1   amLODipine (NORVASC) 2.5 MG tablet Take 1 tablet (2.5 mg total) by mouth daily. (Patient not taking: Reported on 09/08/2021) 90 tablet 3   EQ ALLERGY RELIEF 10 MG tablet Take 1 tablet by mouth once daily 100 tablet 3   levocetirizine (XYZAL) 5 MG tablet Take 1 tablet by mouth twice daily 60 tablet 2   No facility-administered medications prior to visit.    ROS: Review of Systems  Constitutional:  Negative for activity change, appetite change, chills, fatigue and unexpected weight change.  HENT:  Negative for congestion, mouth sores and sinus pressure.   Eyes:  Negative for visual disturbance.  Respiratory:  Negative for cough and chest tightness.   Gastrointestinal:  Negative for abdominal pain and nausea.  Genitourinary:  Negative for difficulty urinating, frequency and  vaginal pain.  Musculoskeletal:  Negative for back pain and gait problem.  Skin:  Negative for pallor and rash.  Neurological:  Negative for dizziness, tremors, weakness, numbness and headaches.  Psychiatric/Behavioral:  Negative for confusion and sleep disturbance.    Objective:  BP (!) 150/78 (BP Location: Left Arm)    Pulse 94    Temp 98.8 F (37.1 C) (Oral)    Ht _0  (1.499 m)    Wt 144 lb 9.6 oz (65.6 kg)    SpO2 97%    BMI 29.21 kg/m   BP Readings from Last 3 Encounters:  09/08/21 (!) 150/78  08/30/21 (!) 194/83  05/07/21 (!) 142/80    Wt Readings from Last 3 Encounters:  09/08/21 144 lb 9.6 oz (65.6 kg)  05/07/21 147 lb 12.8 oz (67 kg)  02/04/21 150 lb 3.2 oz (68.1 kg)    Physical Exam Constitutional:      General: She is not in acute distress.    Appearance: She is well-developed. She is obese.  HENT:     Head: Normocephalic.     Right Ear: External ear normal.     Left Ear: External ear normal.     Nose: Nose normal.  Eyes:     General:        Right eye: No discharge.        Left eye: No discharge.  Conjunctiva/sclera: Conjunctivae normal.     Pupils: Pupils are equal, round, and reactive to light.  Neck:     Thyroid: No thyromegaly.     Vascular: No JVD.     Trachea: No tracheal deviation.  Cardiovascular:     Rate and Rhythm: Normal rate and regular rhythm.     Heart sounds: Normal heart sounds.  Pulmonary:     Effort: No respiratory distress.     Breath sounds: No stridor. No wheezing.  Abdominal:     General: Bowel sounds are normal. There is no distension.     Palpations: Abdomen is soft. There is no mass.     Tenderness: There is no abdominal tenderness. There is no guarding or rebound.  Musculoskeletal:        General: No tenderness.     Cervical back: Normal range of motion and neck supple. No rigidity.  Lymphadenopathy:     Cervical: No cervical adenopathy.  Skin:    Findings: No erythema or rash.  Neurological:     Cranial Nerves:  No cranial nerve deficit.     Motor: No abnormal muscle tone.     Coordination: Coordination normal.     Deep Tendon Reflexes: Reflexes normal.  Psychiatric:        Behavior: Behavior normal.        Thought Content: Thought content normal.        Judgment: Judgment normal.    Lab Results  Component Value Date   WBC 6.2 01/29/2020   HGB 13.0 01/29/2020   HCT 39.3 01/29/2020   PLT 394.0 01/29/2020   GLUCOSE 105 (H) 05/07/2021   CHOL 144 12/30/2020   TRIG 73 12/30/2020   HDL 40 12/30/2020   LDLCALC 89 12/30/2020   ALT 19 09/16/2020   AST 16 09/16/2020   NA 140 05/07/2021   K 3.9 05/07/2021   CL 103 05/07/2021   CREATININE 0.72 05/07/2021   BUN 9 05/07/2021   CO2 28 05/07/2021   TSH 1.58 12/30/2020   HGBA1C 7.3 (H) 05/07/2021   MICROALBUR 1.6 09/16/2020    No results found.  Assessment & Plan:   Problem List Items Addressed This Visit     Diabetes mellitus, type II ([place])    Cont on Metformin and Prandin. Cont w/wt loss  Check A1c      Relevant Orders   Comprehensive metabolic panel   Hemoglobin A1c   Edema    No relapse Cont on Norvasc 5 mg/d      HYPERTENSION    Cont on Norvasc 5 mg/d      URI (upper respiratory infection)    Resolving         Meds ordered this encounter  Medications   Triamcinolone Acetonide (TRIAMCINOLONE 0.1 % CREAM : EUCERIN) CREA    Sig: Apply 1 application topically 2 (two) times daily as needed.    Dispense:  1 each    Refill:  3      Follow-up: Return in about 3 months (around 12/07/2021) for a follow-up visit.  Walker Kehr, MD

## 2021-09-08 NOTE — Assessment & Plan Note (Signed)
No relapse Cont on Norvasc 5 mg/d

## 2021-09-08 NOTE — Assessment & Plan Note (Signed)
Cont on Metformin and Prandin. Cont w/wt loss  Check A1c

## 2021-09-08 NOTE — Assessment & Plan Note (Signed)
Resolving

## 2021-10-24 ENCOUNTER — Other Ambulatory Visit: Payer: Self-pay | Admitting: Internal Medicine

## 2021-10-28 NOTE — Progress Notes (Signed)
? ?FOLLOW UP ?Date of Service/Encounter:  10/29/21 ? ? ?Subjective:  ?Renee Mann (DOB: 1956-03-17) is a 66 y.o. female who returns to the [facility] on 10/29/2021 in re-evaluation of the following: Urticaria on Xolair ?History obtained from: chart review and patient. ? ?For Review, LV was on 04/09/2020 with Dr. Neldon Mc.  Her urticaria was controlled at last visit on Xolair.  Last in office injection on 05/10/20. ? ?Patient has been off [place] since 2021 due to an issue with insurance coverage and a very large bill that arrived at her home.  She was recently started on Medicare, and is hoping this will cover Xolair as she continues to have itching and bumps. ? ?She is taking hydrocortisone cream for the itching.  She also is using triamcinolone ointment.  She is not taking any antihistamines at this point.  She is getting fine bumps all over that are extremely itchy.  ?She is scratching "like a dog with fleas." Ice helps. ?Bumps resolve within 24 hours.  Skin without bruising.  ?Associates itching and no other symptoms. ?No joint pain, joint swelling, fevers or weight loss. ? ? ? ?Allergies as of 10/29/2021   ? ?   Reactions  ? Codeine Hives  ? Atenolol Other (See Comments)  ? Weird feelings  ? Hydrochlorothiazide W-triamterene Other (See Comments)  ? headache  ? ?  ? ?  ?Medication List  ?  ? ?  ? Accurate as of October 29, 2021  1:20 PM. If you have any questions, ask your nurse or doctor.  ?  ?  ? ?  ? ?Accu-Chek Aviva Plus w/Device Kit ?Use to check blood sugar daily. DX E11.9 ?  ?Accu-Chek Guide test strip ?Generic drug: glucose blood ?USE TO CHECK BLOOD SUGARS ONCE A DAY ?  ?Accu-Chek Softclix Lancets lancets ?USE TO CHECK BLOOD SUGAR DAILY ?  ?amLODipine 5 MG tablet ?Commonly known as: NORVASC ?TAKE ONE TABLET BY MOUTH DAILY ?  ?blood glucose meter kit and supplies ?Dispense based on patient and insurance preference. Use up to four times daily as directed. (FOR ICD-10 E10.9, E11.9). ?  ?meloxicam  15 MG tablet ?Commonly known as: MOBIC ?TAKE 1 TABLET BY MOUTH EVERY DAY AS NEEDED FOR PAIN ?  ?metFORMIN 750 MG 24 hr tablet ?Commonly known as: GLUCOPHAGE-XR ?Take 1 tablet by mouth once daily with breakfast ?  ?repaglinide 1 MG tablet ?Commonly known as: PRANDIN ?TAKE 1 TABLET BY MOUTH 3 TIMES DAILY BEFORE MEAL(S) ?  ?triamcinolone 0.1 % cream : eucerin Crea ?Apply 1 application topically 2 (two) times daily as needed. ?  ? ?  ? ?Past Medical History:  ?Diagnosis Date  ? Diabetes mellitus 2008  ? type II  ? Gastroesophageal reflux disease   ? Hives   ? Hypertension   ? Osteoarthritis   ? Palsy ([facility])   ? 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 ?Past Surgical History:  ?Procedure Laterality Date  ? ABDOMINAL HYSTERECTOMY  1987  ? burritis surgery Left 1998  ? hip  ? ?Otherwise, there have been no changes to her past medical history, surgical history, family history, or social history. ? ?ROS: All others negative except as noted per HPI.  ? ?Objective:  ?BP (!) 162/80   Pulse (!) 129   Temp 97.6 ?F (36.4 ?C) (Temporal)   Resp 20   Ht 4' 10"  (1.473 m)   Wt 148 lb (67.1 kg)   SpO2 98%   BMI 30.93 kg/m?  ?Body mass  index is 30.93 kg/m?[name] ?Physical Exam: ?General Appearance:  Alert, cooperative, no distress, appears stated age  ?Head:  Normocephalic, without obvious abnormality, atraumatic  ?Eyes:  Conjunctiva clear, EOM's intact  ?Nose: Nares normal, hypertrophic turbinates and normal mucosa  ?Throat: Lips, tongue normal; teeth and gums normal, normal posterior oropharynx  ?Neck: Supple, symmetrical  ?Lungs:   clear to auscultation bilaterally, Respirations unlabored, no coughing  ?Heart:  regular rate and rhythm and no murmur, Appears well perfused  ?Extremities: No edema  ?Skin: Skin color, texture, turgor normal, papular erythematous bumps on forearms bilaterally.  ?Neurologic: No gross deficits  ? ?Assessment/Plan  ?Mrs. Delprado continues to have itchy bumps concerning for return of her hives.  She is not currently taking an  antihistamine so we will start this.  She will return in 4 weeks, and if no improvement we will submit again for Xolair coverage. ? ?1. Every day use zyrtec (cetirizine) 10 mg twice daily (max 40 mg/day = 2 tablets twice daily) ? ?2. Can add OTC Benadryl and 'cream'  if needed ? ?3. Nasal saline spray as needed ? ?4. Return to clinic in 4 weeks, sooner if needed.  If not controlled, we will submit again for Xolair.  ? ?Sigurd Sos, MD  ?[facility] ? ? ? ? ? ? ?

## 2021-10-29 ENCOUNTER — Other Ambulatory Visit: Payer: Self-pay

## 2021-10-29 ENCOUNTER — Ambulatory Visit (INDEPENDENT_AMBULATORY_CARE_PROVIDER_SITE_OTHER): Payer: Medicare Other | Admitting: Internal Medicine

## 2021-10-29 ENCOUNTER — Encounter: Payer: Self-pay | Admitting: Internal Medicine

## 2021-10-29 VITALS — BP 162/80 | HR 129 | Temp 97.6°F | Resp 20 | Ht <= 58 in | Wt 148.0 lb

## 2021-10-29 DIAGNOSIS — L509 Urticaria, unspecified: Secondary | ICD-10-CM

## 2021-10-29 DIAGNOSIS — L299 Pruritus, unspecified: Secondary | ICD-10-CM | POA: Diagnosis not present

## 2021-10-29 NOTE — Patient Instructions (Signed)
?  1. Every day use zyrtec (cetirizine) 10 mg twice daily (max 40 mg/day = 2 tablets twice daily) ? ?2. Can add OTC Benadryl and 'cream'  if needed ? ?3. Nasal saline spray as needed ? ?4. Return to clinic in 4 weeks, sooner if needed.  If not controlled, we will submit again for Xolair.  ? ?

## 2021-11-25 NOTE — Progress Notes (Signed)
? ?FOLLOW UP ?Date of Service/Encounter:  11/26/21 ? ? ?Subjective:  ?Renee Mann (DOB: July 17, 1956) is a 66 y.o. female who [facility] in re-evaluation of the following: urticaria on Xolair ?History obtained from: chart review and patient. ? ?For Review, LV was on 10/29/21  with Dr.Birdia Jaycox.     ?Patient has been off of her Xolair since 2021 due to an insurance issue.  Using hydrocortisone cream and triamcinolone.  At her last visit we did resubmit for Xolair and started her back on Zyrtec 10 mg twice daily. ? ?Today presents today for follow-up. ?She is miserable and says she is wiping herself down in alcohol. ?Taking zyrtec 2 tablets twice daily and feels ithcing is worse. ?Using triamcinolone topical ointment and benadryl ointment multiple times per day. ?She would like to go back on xolair because this is the only thing that had her controlled previously.  Her rash and itching returned when she stopped this, and she is at a breaking point.  ? ?Allergies as of 11/26/2021   ? ?   Reactions  ? Codeine Hives  ? Atenolol Other (See Comments)  ? Weird feelings  ? Hydrochlorothiazide W-triamterene Other (See Comments)  ? headache  ? ?  ? ?  ?Medication List  ?  ? ?  ? Accurate as of November 26, 2021  1:17 PM. If you have any questions, ask your nurse or doctor.  ?  ?  ? ?  ? ?Accu-Chek Aviva Plus w/Device Kit ?Use to check blood sugar daily. DX E11.9 ?  ?Accu-Chek Guide test strip ?Generic drug: glucose blood ?USE TO CHECK BLOOD SUGARS ONCE A DAY ?  ?Accu-Chek Softclix Lancets lancets ?USE TO CHECK BLOOD SUGAR DAILY ?  ?amLODipine 5 MG tablet ?Commonly known as: NORVASC ?TAKE ONE TABLET BY MOUTH DAILY ?  ?blood glucose meter kit and supplies ?Dispense based on patient and insurance preference. Use up to four times daily as directed. (FOR ICD-10 E10.9, E11.9). ?  ?EPINEPHrine 0.3 mg/0.3 mL Soaj injection ?Commonly known as: EpiPen 2-Pak ?Inject 0.3 mg into the muscle as needed for  anaphylaxis. ?Started by: Tonny Bollman, MD ?  ?IBUPROFEN PO ?Take by mouth as needed. ?  ?meloxicam 15 MG tablet ?Commonly known as: MOBIC ?TAKE 1 TABLET BY MOUTH EVERY DAY AS NEEDED FOR PAIN ?  ?metFORMIN 750 MG 24 hr tablet ?Commonly known as: GLUCOPHAGE-XR ?Take 1 tablet by mouth once daily with breakfast ?  ?repaglinide 1 MG tablet ?Commonly known as: PRANDIN ?TAKE 1 TABLET BY MOUTH 3 TIMES DAILY BEFORE MEAL(S) ?  ?triamcinolone 0.1 % cream : eucerin Crea ?Apply 1 application topically 2 (two) times daily as needed. ?  ? ?  ? ?Past Medical History:  ?Diagnosis Date  ? Diabetes mellitus 2008  ? type II  ? Gastroesophageal reflux disease   ? Hives   ? Hypertension   ? Osteoarthritis   ? Palsy ([facility])   ? 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 ?Past Surgical History:  ?Procedure Laterality Date  ? ABDOMINAL HYSTERECTOMY  1987  ? burritis surgery Left 1998  ? hip  ? ?Otherwise, there have been no changes to her past medical history, surgical history, family history, or social history. ? ?ROS: All others negative except as noted per HPI.  ? ?Objective:  ?BP 122/80   Pulse 84   Temp 98 ?F (36.7 ?C) (Temporal)   Resp (!) 24   Ht 4\' 10"  (1.473 m)  Wt 147 lb 3.2 oz (66.8 kg)   SpO2 97%   BMI 30.76 kg/m?  ?Body mass index is 30.76 kg/m?[name] ?Physical Exam: ?General Appearance:  Alert, cooperative, no distress, appears stated age  ?Head:  Normocephalic, without obvious abnormality, atraumatic  ?Eyes:  Conjunctiva clear, EOM's intact  ?Nose: Nares normal, normal mucosa and septum midline  ?Throat: Lips, tongue normal; teeth and gums normal, normal posterior oropharynx  ?Neck: Supple, symmetrical  ?Lungs:   clear to auscultation bilaterally, Respirations unlabored, no coughing  ?Heart:  regular rate and rhythm and no murmur, Appears well perfused  ?Extremities: No edema  ?Skin: Skin color, texture, turgor normal, no rashes or lesions on visualized portions of skin  ?Neurologic: No gross deficits  ? ?Assessment/Plan  ?Hives uncontrolled on  high-dose Nonsedating antihistamine.  Has been previously controlled on Xolair.  Sample provided today and will resubmit. ? ?1. Every day use zyrtec (cetirizine) 10 mg twice daily (max 40 mg/day = 2 tablets twice daily) ? ?2. Can add OTC Benadryl and 'cream'  if needed ? ?3. Nasal saline spray as needed ? ?4.Sample Xolair 300 mg given in clinic today-will submit for insurance coverage. ? ?Follow-up in 3 months, sooner if needed. ?It was a pleasure seeing you in clinic again today. ? ? ?Tonny Bollman, MD  ?[facility] ? ? ? ? ? ? ?

## 2021-11-26 ENCOUNTER — Telehealth: Payer: Self-pay | Admitting: *Deleted

## 2021-11-26 ENCOUNTER — Ambulatory Visit (INDEPENDENT_AMBULATORY_CARE_PROVIDER_SITE_OTHER): Payer: Medicare Other | Admitting: Internal Medicine

## 2021-11-26 ENCOUNTER — Encounter: Payer: Self-pay | Admitting: Internal Medicine

## 2021-11-26 VITALS — BP 122/80 | HR 84 | Temp 98.0°F | Resp 24 | Ht <= 58 in | Wt 147.2 lb

## 2021-11-26 DIAGNOSIS — L509 Urticaria, unspecified: Secondary | ICD-10-CM

## 2021-11-26 DIAGNOSIS — L501 Idiopathic urticaria: Secondary | ICD-10-CM

## 2021-11-26 MED ORDER — EPINEPHRINE 0.3 MG/0.3ML IJ SOAJ: 0.3000 mg | INTRAMUSCULAR | 1 refills | Status: DC | PRN

## 2021-11-26 MED ORDER — OMALIZUMAB 150 MG/ML ~~LOC~~ SOSY
300.0000 mg | PREFILLED_SYRINGE | Freq: Once | SUBCUTANEOUS | Status: AC
Start: 2021-11-26 — End: 2021-11-26
  Administered 2021-11-26: 300 mg via SUBCUTANEOUS

## 2021-11-26 NOTE — Telephone Encounter (Signed)
-----   Message from Sigurd Sos, MD sent at 11/26/2021 10:56 AM EDT ----- ?Hi Renee Mann.  Can we submit for Xolair for [name] for hives?  She is a restart, stopped in 2021.  Said there were some issues with her insurance at that time but has a Education officer, [employer]. ?

## 2021-11-26 NOTE — Patient Instructions (Addendum)
?  1. Every day use zyrtec (cetirizine) 10 mg twice daily (max 40 mg/day = 2 tablets twice daily) ? ?2. Can add OTC Benadryl and 'cream'  if needed ? ?3. Nasal saline spray as needed ? ?4.Sample Xolair 300 mg given in clinic today-will submit for insurance coverage. ? ?Follow-up in 3 months, sooner if needed. ?It was a pleasure seeing you in clinic again today. ? ?

## 2021-11-26 NOTE — Telephone Encounter (Signed)
Also she will be so happy.  That was so quick; thank you

## 2021-11-26 NOTE — Telephone Encounter (Signed)
Called patient after looking at her Ins MCR/Supplement she will be buy and bill. Scheduled her for next injection ?

## 2021-11-26 NOTE — Progress Notes (Signed)
Immunotherapy ? ? ?Patient Details  ?Name: Renee Mann ?MRN: [id number] ?Date of Birth: 04-13-1956 ? ?11/26/2021 ? ?[name] started injections for  Xolair  ?Frequency: Every 4 Weeks ?Epi-Pen:Epi-Pen Available  ?Consent signed and patient instructions given. ?Patient restarted Xolair today and received '300mg'$  injection. Patient left without waiting. Pending approval patient will continue Xolair 300 every 4 weeks for Urticaria.  ? ? ?[name] ?11/26/2021, 11:50 AM ? ? ?

## 2021-11-28 ENCOUNTER — Other Ambulatory Visit: Payer: Self-pay | Admitting: Internal Medicine

## 2021-12-08 ENCOUNTER — Encounter: Payer: Self-pay | Admitting: Internal Medicine

## 2021-12-08 ENCOUNTER — Ambulatory Visit (INDEPENDENT_AMBULATORY_CARE_PROVIDER_SITE_OTHER): Payer: Medicare Other | Admitting: Internal Medicine

## 2021-12-08 DIAGNOSIS — E119 Type 2 diabetes mellitus without complications: Secondary | ICD-10-CM

## 2021-12-08 DIAGNOSIS — M1990 Unspecified osteoarthritis, unspecified site: Secondary | ICD-10-CM | POA: Diagnosis not present

## 2021-12-08 DIAGNOSIS — G4700 Insomnia, unspecified: Secondary | ICD-10-CM

## 2021-12-08 DIAGNOSIS — M25551 Pain in right hip: Secondary | ICD-10-CM | POA: Diagnosis not present

## 2021-12-08 DIAGNOSIS — M25552 Pain in left hip: Secondary | ICD-10-CM | POA: Diagnosis not present

## 2021-12-08 LAB — COMPREHENSIVE METABOLIC PANEL
ALT: 45 U/L — ABNORMAL HIGH (ref 0–35)
AST: 29 U/L (ref 0–37)
Albumin: 4.5 g/dL (ref 3.5–5.2)
Alkaline Phosphatase: 123 U/L — ABNORMAL HIGH (ref 39–117)
BUN: 10 mg/dL (ref 6–23)
CO2: 28 mEq/L (ref 19–32)
Calcium: 9.6 mg/dL (ref 8.4–10.5)
Chloride: 102 mEq/L (ref 96–112)
Creatinine, Ser: 0.86 mg/dL (ref 0.40–1.20)
GFR: 69.96 mL/min (ref >60.00)
Glucose, Bld: 111 mg/dL — ABNORMAL HIGH (ref 70–99)
Potassium: 3.8 mEq/L (ref 3.5–5.1)
Sodium: 139 mEq/L (ref 135–145)
Total Bilirubin: 0.3 mg/dL (ref 0.2–1.2)
Total Protein: 8 g/dL (ref 6.0–8.3)

## 2021-12-08 LAB — HEMOGLOBIN A1C: Hgb A1c MFr Bld: 7.4 % — ABNORMAL HIGH (ref 4.6–6.5)

## 2021-12-08 NOTE — Patient Instructions (Addendum)
Tylenol PM ?Melatonin,  ?Valerian root ? ?Blue-Emu - to use 2-3 times a day ? ?

## 2021-12-08 NOTE — Assessment & Plan Note (Signed)
Blue-Emu cream was recommended to use 2-3 times a day ? ?

## 2021-12-08 NOTE — Assessment & Plan Note (Signed)
Worse, can try: ?Tylenol PM ?Melatonin,  ?Valerian root ?

## 2021-12-08 NOTE — Progress Notes (Signed)
? ?Subjective:  ?Patient ID: Renee Mann, female    DOB: May 10, 1956  Age: 66 y.o. MRN: [id number] ? ?CC: Insomnia (For the last month she has been having issues with falling asleep. ) ? ? ?HPI ?Renee Mann presents for HTN ?C/o insomnia, hip pain ? ?Outpatient Medications Prior to Visit  ?Medication Sig Dispense Refill  ? Accu-Chek Softclix Lancets lancets USE TO CHECK BLOOD SUGAR DAILY 100 each 3  ? amLODipine (NORVASC) 5 MG tablet TAKE ONE TABLET BY MOUTH DAILY 90 tablet 2  ? blood glucose meter kit and supplies Dispense based on patient and insurance preference. Use up to four times daily as directed. (FOR ICD-10 E10.9, E11.9). 1 each 0  ? Blood Glucose Monitoring Suppl (ACCU-CHEK AVIVA PLUS) w/Device KIT Use to check blood sugar daily. DX E11.9 1 kit 0  ? EPINEPHrine (EPIPEN 2-PAK) 0.3 mg/0.3 mL IJ SOAJ injection Inject 0.3 mg into the muscle as needed for anaphylaxis. 1 each 1  ? glucose blood (ACCU-CHEK GUIDE) test strip USE TO CHECK BLOOD SUGARS ONCE A DAY 90 strip 3  ? IBUPROFEN PO Take by mouth as needed.    ? meloxicam (MOBIC) 15 MG tablet TAKE 1 TABLET BY MOUTH EVERY DAY AS NEEDED FOR PAIN 30 tablet 1  ? metFORMIN (GLUCOPHAGE-XR) 750 MG 24 hr tablet Take 1 tablet by mouth once daily with breakfast 90 tablet 2  ? repaglinide (PRANDIN) 1 MG tablet TAKE 1 TABLET BY MOUTH 3 TIMES DAILY BEFORE MEAL(S) 270 tablet 1  ? Triamcinolone Acetonide (TRIAMCINOLONE 0.1 % CREAM : EUCERIN) CREA Apply 1 application topically 2 (two) times daily as needed. 1 each 3  ? ?No facility-administered medications prior to visit.  ? ? ?ROS: ?Review of Systems  ?Constitutional:  Negative for activity change, appetite change, chills, fatigue and unexpected weight change.  ?HENT:  Negative for congestion, mouth sores and sinus pressure.   ?Eyes:  Negative for visual disturbance.  ?Respiratory:  Negative for cough and chest tightness.   ?Gastrointestinal:  Negative for abdominal pain and nausea.  ?Genitourinary:  Negative for  difficulty urinating, frequency and vaginal pain.  ?Musculoskeletal:  Positive for arthralgias. Negative for back pain and gait problem.  ?Skin:  Negative for pallor and rash.  ?Neurological:  Negative for dizziness, tremors, weakness, numbness and headaches.  ?Psychiatric/Behavioral:  Positive for sleep disturbance. Negative for confusion and suicidal ideas. The patient is not nervous/anxious.   ? ?Objective:  ?BP 132/80   Pulse 88   Temp 97.9 ?F (36.6 ?C) (Oral)   Ht 4' 10" (1.473 m)   Wt 145 lb (65.8 kg)   SpO2 99%   BMI 30.31 kg/m?  ? ?BP Readings from Last 3 Encounters:  ?12/08/21 132/80  ?11/26/21 122/80  ?10/29/21 (!) 162/80  ? ? ?Wt Readings from Last 3 Encounters:  ?12/08/21 145 lb (65.8 kg)  ?11/26/21 147 lb 3.2 oz (66.8 kg)  ?10/29/21 148 lb (67.1 kg)  ? ? ?Physical Exam ?Constitutional:   ?   General: She is not in acute distress. ?   Appearance: She is well-developed.  ?HENT:  ?   Head: Normocephalic.  ?   Right Ear: External ear normal.  ?   Left Ear: External ear normal.  ?   Nose: Nose normal.  ?Eyes:  ?   General:     ?   Right eye: No discharge.     ?   Left eye: No discharge.  ?   Conjunctiva/sclera: Conjunctivae normal.  ?  Pupils: Pupils are equal, round, and reactive to light.  ?Neck:  ?   Thyroid: No thyromegaly.  ?   Vascular: No JVD.  ?   Trachea: No tracheal deviation.  ?Cardiovascular:  ?   Rate and Rhythm: Normal rate and regular rhythm.  ?   Heart sounds: Normal heart sounds.  ?Pulmonary:  ?   Effort: No respiratory distress.  ?   Breath sounds: No stridor. No wheezing.  ?Abdominal:  ?   General: Bowel sounds are normal. There is no distension.  ?   Palpations: Abdomen is soft. There is no mass.  ?   Tenderness: There is no abdominal tenderness. There is no guarding or rebound.  ?Musculoskeletal:     ?   General: No tenderness.  ?   Cervical back: Normal range of motion and neck supple. No rigidity.  ?Lymphadenopathy:  ?   Cervical: No cervical adenopathy.  ?Skin: ?   Findings: No  erythema or rash.  ?Neurological:  ?   Cranial Nerves: No cranial nerve deficit.  ?   Motor: No abnormal muscle tone.  ?   Coordination: Coordination normal.  ?   Deep Tendon Reflexes: Reflexes normal.  ?Psychiatric:     ?   Behavior: Behavior normal.     ?   Thought Content: Thought content normal.     ?   Judgment: Judgment normal.  ? ? ?Lab Results  ?Component Value Date  ? WBC 6.2 01/29/2020  ? HGB 13.0 01/29/2020  ? HCT 39.3 01/29/2020  ? PLT 394.0 01/29/2020  ? GLUCOSE 56 (L) 09/08/2021  ? CHOL 144 12/30/2020  ? TRIG 73 12/30/2020  ? HDL 40 12/30/2020  ? [place] 89 12/30/2020  ? ALT 34 09/08/2021  ? AST 25 09/08/2021  ? NA 139 09/08/2021  ? K 3.7 09/08/2021  ? CL 102 09/08/2021  ? CREATININE 0.75 09/08/2021  ? BUN 11 09/08/2021  ? CO2 26 09/08/2021  ? TSH 1.58 12/30/2020  ? HGBA1C 7.4 (H) 09/08/2021  ? MICROALBUR 1.6 09/16/2020  ? ? ?No results found. ? ?Assessment & Plan:  ? ?Problem List Items Addressed This Visit   ?None ?  ? ? ?No orders of the defined types were placed in this encounter. ?  ? ? ?Follow-up: No follow-ups on file. ? ?Walker Kehr, MD ?

## 2021-12-08 NOTE — Assessment & Plan Note (Signed)
Cont on Metformin and Prandin. Cont w/wt loss  ?

## 2021-12-15 ENCOUNTER — Encounter: Payer: Self-pay | Admitting: Internal Medicine

## 2021-12-24 ENCOUNTER — Other Ambulatory Visit: Payer: Self-pay | Admitting: Internal Medicine

## 2021-12-24 ENCOUNTER — Ambulatory Visit (INDEPENDENT_AMBULATORY_CARE_PROVIDER_SITE_OTHER): Payer: Medicare Other

## 2021-12-24 DIAGNOSIS — L509 Urticaria, unspecified: Secondary | ICD-10-CM | POA: Diagnosis not present

## 2021-12-24 MED ORDER — OMALIZUMAB 150 MG ~~LOC~~ SOLR
300.0000 mg | SUBCUTANEOUS | Status: DC
  Administered 2021-12-24 – 2022-04-15 (×5): 300 mg via SUBCUTANEOUS

## 2021-12-25 ENCOUNTER — Emergency Department (HOSPITAL_COMMUNITY): Payer: Medicare Other

## 2021-12-25 ENCOUNTER — Other Ambulatory Visit: Payer: Self-pay

## 2021-12-25 ENCOUNTER — Encounter (HOSPITAL_COMMUNITY): Payer: Self-pay | Admitting: Emergency Medicine

## 2021-12-25 ENCOUNTER — Emergency Department (HOSPITAL_COMMUNITY)
Admission: EM | Admit: 2021-12-25 | Discharge: 2021-12-25 | Disposition: A | Discharge status: Home / Self Care | Payer: Medicare Other | Attending: Emergency Medicine | Admitting: Emergency Medicine

## 2021-12-25 DIAGNOSIS — I10 Essential (primary) hypertension: Secondary | ICD-10-CM | POA: Insufficient documentation

## 2021-12-25 DIAGNOSIS — R079 Chest pain, unspecified: Secondary | ICD-10-CM | POA: Diagnosis not present

## 2021-12-25 DIAGNOSIS — Z7984 Long term (current) use of oral hypoglycemic drugs: Secondary | ICD-10-CM | POA: Insufficient documentation

## 2021-12-25 DIAGNOSIS — E119 Type 2 diabetes mellitus without complications: Secondary | ICD-10-CM | POA: Diagnosis not present

## 2021-12-25 DIAGNOSIS — Z87891 Personal history of nicotine dependence: Secondary | ICD-10-CM | POA: Insufficient documentation

## 2021-12-25 DIAGNOSIS — Z79899 Other long term (current) drug therapy: Secondary | ICD-10-CM | POA: Insufficient documentation

## 2021-12-25 DIAGNOSIS — R0789 Other chest pain: Secondary | ICD-10-CM | POA: Diagnosis not present

## 2021-12-25 LAB — CBC WITH DIFFERENTIAL/PLATELET
Abs Immature Granulocytes: 0.02 10*3/uL (ref 0.00–0.07)
Basophils Absolute: 0.1 10*3/uL (ref 0.0–0.1)
Basophils Relative: 1 %
Eosinophils Absolute: 0.2 10*3/uL (ref 0.0–0.5)
Eosinophils Relative: 2 %
HCT: 40.9 % (ref 36.0–46.0)
Hemoglobin: 13.2 g/dL (ref 12.0–15.0)
Immature Granulocytes: 0 %
Lymphocytes Relative: 28 %
Lymphs Abs: 2.8 10*3/uL (ref 0.7–4.0)
MCH: 29.1 pg (ref 26.0–34.0)
MCHC: 32.3 g/dL (ref 30.0–36.0)
MCV: 90.1 fL (ref 80.0–100.0)
Monocytes Absolute: 0.5 10*3/uL (ref 0.1–1.0)
Monocytes Relative: 6 %
Neutro Abs: 6.3 10*3/uL (ref 1.7–7.7)
Neutrophils Relative %: 63 %
Platelets: 428 10*3/uL — ABNORMAL HIGH (ref 150–400)
RBC: 4.54 MIL/uL (ref 3.87–5.11)
RDW: 13.3 % (ref 11.5–15.5)
WBC: 9.9 10*3/uL (ref 4.0–10.5)
nRBC: 0 % (ref 0.0–0.2)

## 2021-12-25 LAB — BASIC METABOLIC PANEL
Anion gap: 12 (ref 5–15)
BUN: 10 mg/dL (ref 8–23)
CO2: 23 mmol/L (ref 22–32)
Calcium: 9.5 mg/dL (ref 8.9–10.3)
Chloride: 103 mmol/L (ref 98–111)
Creatinine, Ser: 0.93 mg/dL (ref 0.44–1.00)
GFR, Estimated: >60 mL/min (ref >60)
Glucose, Bld: 161 mg/dL — ABNORMAL HIGH (ref 70–99)
Potassium: 4 mmol/L (ref 3.5–5.1)
Sodium: 138 mmol/L (ref 135–145)

## 2021-12-25 LAB — TROPONIN I (HIGH SENSITIVITY)
Troponin I (High Sensitivity): 3 ng/L (ref <18)
Troponin I (High Sensitivity): <2 ng/L (ref <18)

## 2021-12-25 NOTE — ED Provider Notes (Addendum)
?[facility] ?Provider Note ? ? ?CSN: [id number] ?Arrival date & time: 12/25/21  2013 ? ?  ? ?History ? ?Chief Complaint  ?Patient presents with  ? Chest Pain  ?  Patient arrives via EMS with complaints of chest pain while driving home from work. EMS gave 324 of aspirin. On arrival patient has no complaints of pain. Per husband he thinks patient might be having a panic attack as she has history of same from 15 years ago.   ? ? ?Renee Mann is a 66 y.o. female. ? ?Patient is a 66 year old female with past medical history of diabetes and hypertension and remote history of smoking presenting for complaints of chest pain.  Patient states she was driving her car when she had sternal chest pain, without radiation, severe, 10 out of 10, sharp and aching in nature, lasting about 20 minutes prior to movement.  Patient states overall chest pain took 1 to 2 hours to resolve ? ?The history is provided by the patient. No language interpreter was used.  ?Chest Pain ? ?  ? ?Home Medications ?Prior to Admission medications   ?Medication Sig Start Date End Date Taking? Authorizing Provider  ?Accu-Chek Softclix Lancets lancets USE TO CHECK BLOOD SUGAR DAILY 04/11/21   Plotnikov, [name], MD  ?amLODipine (NORVASC) 5 MG tablet TAKE ONE TABLET BY MOUTH DAILY 07/15/21   Plotnikov, [name], MD  ?blood glucose meter kit and supplies Dispense based on patient and insurance preference. Use up to four times daily as directed. (FOR ICD-10 E10.9, E11.9). 09/16/20   Plotnikov, [name], MD  ?Blood Glucose Monitoring Suppl (ACCU-CHEK AVIVA PLUS) w/Device KIT Use to check blood sugar daily. DX E11.9 10/10/18   Plotnikov, [name], MD  ?EPINEPHrine (EPIPEN 2-PAK) 0.3 mg/0.3 mL IJ SOAJ injection Inject 0.3 mg into the muscle as needed for anaphylaxis. 11/26/21   Sigurd Sos, MD  ?glucose blood (ACCU-CHEK GUIDE) test strip USE TO CHECK BLOOD SUGARS ONCE A DAY 03/27/21   Plotnikov, [name], MD  ?IBUPROFEN PO Take  by mouth as needed.    [provider]  ?meloxicam (MOBIC) 15 MG tablet TAKE 1 TABLET BY MOUTH EVERY DAY AS NEEDED FOR PAIN 10/30/21   Plotnikov, [name], MD  ?metFORMIN (GLUCOPHAGE-XR) 750 MG 24 hr tablet TAKE 1 TABLET BY MOUTH ONCE DAILY WITH BREAKFAST 12/24/21   Plotnikov, [name], MD  ?repaglinide (PRANDIN) 1 MG tablet TAKE 1 TABLET BY MOUTH 3 TIMES DAILY BEFORE MEAL(S) 12/01/21   Plotnikov, [name], MD  ?Triamcinolone Acetonide (TRIAMCINOLONE 0.1 % CREAM : EUCERIN) CREA Apply 1 application topically 2 (two) times daily as needed. 09/08/21   Plotnikov, [name], MD  ?   ? ?Allergies    ?Codeine, Atenolol, and Hydrochlorothiazide w-triamterene   ? ?Review of Systems   ?Review of Systems  ?Cardiovascular:  Positive for chest pain.  ? ?Physical Exam ?Updated Vital Signs ?BP (!) 148/77   Pulse 91   Temp 98.3 ?F (36.8 ?C) (Oral)   Resp 19   Ht $R'4\' 10"'MA$  (1.473 m)   Wt 65.8 kg   SpO2 99%   BMI 30.31 kg/m?  ?Physical Exam ? ?ED Results / Procedures / Treatments   ?Labs ?(all labs ordered are listed, but only abnormal results are displayed) ?Labs Reviewed  ?CBC WITH DIFFERENTIAL/PLATELET - Abnormal; Notable for the following components:  ?    Result Value  ? Platelets 428 (*)   ? All other components within normal limits  ?  BASIC METABOLIC PANEL - Abnormal; Notable for the following components:  ? Glucose, Bld 161 (*)   ? All other components within normal limits  ?TROPONIN I (HIGH SENSITIVITY)  ?TROPONIN I (HIGH SENSITIVITY)  ? ? ?EKG ?EKG Interpretation ? ?Date/Time:  Thursday Dec 25 2021 20:19:06 EDT ?Ventricular Rate:  89 ?PR Interval:  159 ?QRS Duration: 84 ?QT Interval:  361 ?QTC Calculation: 440 ?R Axis:   53 ?Text Interpretation: Sinus rhythm Left atrial enlargement Anterior infarct, old Confirmed by Campbell Stall ([id number]) on 12/28/4732 8:32:04 PM ? ?Radiology ?DG Chest Portable 1 View ? ?Result Date: 12/25/2021 ?CLINICAL DATA:  Chest pain. EXAM: PORTABLE CHEST 1 VIEW COMPARISON:  Chest x-ray 08/31/2019.  FINDINGS: The heart size and mediastinal contours are within normal limits. Both lungs are clear. The visualized skeletal structures are unremarkable. IMPRESSION: No active disease. Electronically Signed   By: Ronney Asters M.D.   On: 12/25/2021 20:51   ? ?Procedures ?Procedures  ? ? ?Medications Ordered in ED ?Medications - No data to display ? ?ED Course/ Medical Decision Making/ A&P ?  ?                        ?Medical Decision Making ?Amount and/or Complexity of Data Reviewed ?Labs: ordered. ?Radiology: ordered. ? ? ?The patient's chest pain is not suggestive of pulmonary embolus, cardiac ischemia, aortic dissection, pericarditis, myocarditis, pulmonary embolism, pneumothorax, pneumonia, Zoster, or esophageal perforation, or other serious etiology.  Historically not abrupt in onset, tearing or ripping, pulses symmetric. EKG nonspecific for ischemia/infarction. No dysrhythmias, brugada, WPW, prolonged QT noted. [CXR reviewed and WNL] [Troponin negative x2. CXR reviewed. Labs without demonstration of acute pathology unless otherwise noted above.  ? ?HEART Score: 5 points.  Patient remains chest pain-free at this time and requesting discharge.  Recommended for close follow-up with cardiology. ? ?Patient in no distress and overall condition improved here in the ED. Detailed discussions were had with the patient regarding current findings, and need for close f/u with PCP or on call doctor. The patient has been instructed to return immediately if the symptoms worsen in any way for re-evaluation. Patient verbalized understanding and is in agreement with current care plan. All questions answered prior to discharge.  ? ? ? ? ? ? ? ?Final Clinical Impression(s) / ED Diagnoses ?Final diagnoses:  ?Chest pain, unspecified type  ? ? ?Rx / DC Orders ?ED Discharge Orders   ? ? None  ? ?  ? ? ?  ?Lianne Cure, DO ?03/70/96 2257 ? ?  ?Lianne Cure, DO ?43/83/81 2300 ? ?

## 2022-01-21 ENCOUNTER — Ambulatory Visit (INDEPENDENT_AMBULATORY_CARE_PROVIDER_SITE_OTHER): Payer: Medicare Other

## 2022-01-21 DIAGNOSIS — L509 Urticaria, unspecified: Secondary | ICD-10-CM

## 2022-01-21 DIAGNOSIS — L501 Idiopathic urticaria: Secondary | ICD-10-CM | POA: Diagnosis not present

## 2022-01-28 ENCOUNTER — Encounter: Payer: Self-pay | Admitting: Internal Medicine

## 2022-01-29 ENCOUNTER — Other Ambulatory Visit: Payer: Self-pay | Admitting: Internal Medicine

## 2022-01-29 MED ORDER — VITAMIN D3 50 MCG (2000 UT) PO CAPS: 2000.0000 [IU] | ORAL_CAPSULE | Freq: Every day | ORAL | 3 refills | Status: AC

## 2022-02-17 DIAGNOSIS — L501 Idiopathic urticaria: Secondary | ICD-10-CM | POA: Diagnosis not present

## 2022-02-18 ENCOUNTER — Ambulatory Visit (INDEPENDENT_AMBULATORY_CARE_PROVIDER_SITE_OTHER): Payer: Medicare Other

## 2022-02-18 DIAGNOSIS — L501 Idiopathic urticaria: Secondary | ICD-10-CM | POA: Diagnosis not present

## 2022-02-18 DIAGNOSIS — L509 Urticaria, unspecified: Secondary | ICD-10-CM

## 2022-02-20 NOTE — Progress Notes (Unsigned)
FOLLOW UP Date of Service/Encounter:  02/25/22   Subjective:  Renee Mann (DOB: March 09, 1956) is a 66 y.o. female who [facility] in re-evaluation of the following: Chronic urticaria History obtained from: chart review and patient.  For Review, LV was on 11/26/21 with Dr.Kobyn Kray seen for routine follow-up.  Her hives were uncontrolled on high-dose nonsedating antihistamine so we restarted Xolair which has been approved.  Pertinent history/diagnostics: Patient previously on Xolair but stopped in 2021 due to an insurance issue.  Hives are controlled at that time so she continued on antihistamines and topical steroids.  However in March 2023, hives returned and she wanted to restart Xolair.  Today presents for follow-up. Her itching and hives have improved significantly since starting Xolair.  She does occasionally still have some itching and is taking Benadryl 2 tablets in the morning and on occasion 2 tablets at night.  She still uses her Benadryl cream.  She also uses triamcinolone ointment which she needs refills of.  When the itching is really bad, she will wipe herself down with alcohol which stops the itching.  She is not having to use these other medications as often since starting Xolair, but does use them some.-Particularly on days when she is going to be outside.  She would like to give the Xolair some time to see if she gains additional symptom control.  Allergies as of 02/25/2022       Reactions   Codeine Hives   Atenolol Other (See Comments)   Weird feelings   Hydrochlorothiazide W-triamterene Other (See Comments)   headache        Medication List        Accurate as of February 25, 2022 12:58 PM. If you have any questions, ask your nurse or doctor.          Accu-Chek Aviva Plus w/Device Kit Use to check blood sugar daily. DX E11.9   Accu-Chek Guide test strip Generic drug: glucose blood USE TO CHECK BLOOD SUGARS ONCE A DAY    Accu-Chek Softclix Lancets lancets USE TO CHECK BLOOD SUGAR DAILY   amLODipine 5 MG tablet Commonly known as: NORVASC TAKE ONE TABLET BY MOUTH DAILY   blood glucose meter kit and supplies Dispense based on patient and insurance preference. Use up to four times daily as directed. (FOR ICD-10 E10.9, E11.9).   diphenhydrAMINE 25 MG tablet Commonly known as: BENADRYL Take 25 mg by mouth every 6 (six) hours as needed.   EPINEPHrine 0.3 mg/0.3 mL Soaj injection Commonly known as: EpiPen 2-Pak Inject 0.3 mg into the muscle as needed for anaphylaxis.   IBUPROFEN PO Take by mouth as needed.   meloxicam 15 MG tablet Commonly known as: MOBIC TAKE 1 TABLET BY MOUTH EVERY DAY AS NEEDED FOR PAIN   metFORMIN 750 MG 24 hr tablet Commonly known as: GLUCOPHAGE-XR TAKE 1 TABLET BY MOUTH ONCE DAILY WITH BREAKFAST   repaglinide 1 MG tablet Commonly known as: PRANDIN TAKE 1 TABLET BY MOUTH 3 TIMES DAILY BEFORE MEAL(S)   triamcinolone 0.1 % cream : eucerin Crea Apply 1 application topically 2 (two) times daily as needed.   Vitamin D3 50 MCG (2000 UT) capsule Take 1 capsule (2,000 Units total) by mouth daily.       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  ROS: All others negative except as noted per HPI.   Objective:  BP (!) 142/82   Pulse 84   Temp 98.1 F (36.7 C) (Temporal)   Wt 146 lb (66.2 kg)   SpO2 100%   BMI 30.51 kg/m  Body mass index is 30.51 kg/m. Physical Exam: General Appearance:  Alert, cooperative, no distress, appears stated age  Head:  Normocephalic, without obvious  abnormality, atraumatic  Eyes:  Conjunctiva clear, EOM's intact  Nose: Nares normal, hypertrophic turbinates and normal mucosa  Throat: Lips, tongue normal; teeth and gums normal, normal posterior oropharynx  Neck: Supple, symmetrical  Lungs:   , Respirations unlabored, no coughing  Heart:  , Appears well perfused  Extremities: No edema  Skin: Skin color, texture, turgor normal, hyperpigmented papular areas scattered on upper extremities  Neurologic: No gross deficits   Assessment/Plan  Chronic hives and pruritus-partially controlled, improved since starting Xolair.  Did discuss potentially changing dosing intervals or monthly dose or both depending on how she is doing at next visit.  Discussed adding Sarna cream to be used in place of alcohol wipes to prevent dry skin and subsequent itching.  1. Every day use zyrtec (cetirizine) 10 mg twice daily (max 40 mg/day = 2 tablets twice daily). Can use this in place of benadryl to avoid sleepiness.   2. Can add OTC Benadryl and 'cream'  if needed and SARNA lotion (anti-itch cream) as needed- use in place of alcohol wipes to avoid drying skin  3. Nasal saline spray as needed  4 Continue Xolair 300 mg every 4 weeks.  Keep updated epinephrine autoinjector.   Follow-up in 3 months, sooner if needed. It was a pleasure seeing you in clinic again today.  Tonny Bollman, MD  [facility]

## 2022-02-25 ENCOUNTER — Ambulatory Visit (INDEPENDENT_AMBULATORY_CARE_PROVIDER_SITE_OTHER): Payer: Medicare Other | Admitting: Internal Medicine

## 2022-02-25 ENCOUNTER — Encounter: Payer: Self-pay | Admitting: Internal Medicine

## 2022-02-25 VITALS — BP 142/82 | HR 84 | Temp 98.1°F | Wt 146.0 lb

## 2022-02-25 DIAGNOSIS — L299 Pruritus, unspecified: Secondary | ICD-10-CM

## 2022-02-25 DIAGNOSIS — L509 Urticaria, unspecified: Secondary | ICD-10-CM | POA: Diagnosis not present

## 2022-02-25 MED ORDER — TRIAMCINOLONE 0.1 % CREAM:EUCERIN CREAM 1:1: 1.0000 | TOPICAL_CREAM | Freq: Two times a day (BID) | CUTANEOUS | 3 refills | Status: DC | PRN

## 2022-02-25 MED ORDER — TRIAMCINOLONE ACETONIDE 0.1 % EX CREA: 1.0000 | TOPICAL_CREAM | Freq: Two times a day (BID) | CUTANEOUS | 5 refills | Status: DC

## 2022-02-25 NOTE — Addendum Note (Signed)
Addended by: Eloy End D on: 02/25/2022 01:33 PM   Modules accepted: Orders

## 2022-02-25 NOTE — Patient Instructions (Addendum)
  1. Every day use zyrtec (cetirizine) 10 mg twice daily (max 40 mg/day = 2 tablets twice daily). Can use this in place of benadryl to avoid sleepiness.   2. Can add OTC Benadryl and 'cream'  if needed and SARNA lotion (anti-itch cream) as needed- use in place of alcohol wipes to avoid drying skin  3. Nasal saline spray as needed  4 Continue Xolair 300 mg every 4 weeks.  Keep updated epinephrine autoinjector.   Follow-up in 3 months, sooner if needed. It was a pleasure seeing you in clinic again today.

## 2022-02-25 NOTE — Addendum Note (Signed)
Addended by: Eloy End D on: 02/25/2022 01:28 PM   Modules accepted: Orders

## 2022-03-10 ENCOUNTER — Encounter: Payer: Self-pay | Admitting: Internal Medicine

## 2022-03-10 ENCOUNTER — Ambulatory Visit (INDEPENDENT_AMBULATORY_CARE_PROVIDER_SITE_OTHER): Payer: Medicare Other | Admitting: Internal Medicine

## 2022-03-10 DIAGNOSIS — G4700 Insomnia, unspecified: Secondary | ICD-10-CM

## 2022-03-10 DIAGNOSIS — E119 Type 2 diabetes mellitus without complications: Secondary | ICD-10-CM

## 2022-03-10 DIAGNOSIS — M10079 Idiopathic gout, unspecified ankle and foot: Secondary | ICD-10-CM | POA: Diagnosis not present

## 2022-03-10 DIAGNOSIS — K219 Gastro-esophageal reflux disease without esophagitis: Secondary | ICD-10-CM | POA: Diagnosis not present

## 2022-03-10 DIAGNOSIS — I10 Essential (primary) hypertension: Secondary | ICD-10-CM

## 2022-03-10 LAB — COMPREHENSIVE METABOLIC PANEL
ALT: 19 U/L (ref 0–35)
AST: 18 U/L (ref 0–37)
Albumin: 4.8 g/dL (ref 3.5–5.2)
Alkaline Phosphatase: 106 U/L (ref 39–117)
BUN: 10 mg/dL (ref 6–23)
CO2: 29 mEq/L (ref 19–32)
Calcium: 10.3 mg/dL (ref 8.4–10.5)
Chloride: 101 mEq/L (ref 96–112)
Creatinine, Ser: 0.76 mg/dL (ref 0.40–1.20)
GFR: 81 mL/min (ref >60.00)
Glucose, Bld: 116 mg/dL — ABNORMAL HIGH (ref 70–99)
Potassium: 4 mEq/L (ref 3.5–5.1)
Sodium: 139 mEq/L (ref 135–145)
Total Bilirubin: 0.4 mg/dL (ref 0.2–1.2)
Total Protein: 8.5 g/dL — ABNORMAL HIGH (ref 6.0–8.3)

## 2022-03-10 LAB — LIPID PANEL
Cholesterol: 193 mg/dL (ref 0–200)
HDL: 42.4 mg/dL (ref >39.00)
LDL Cholesterol: 128 mg/dL — ABNORMAL HIGH (ref 0–99)
NonHDL: 150.49
Total CHOL/HDL Ratio: 5
Triglycerides: 112 mg/dL (ref 0.0–149.0)
VLDL: 22.4 mg/dL (ref 0.0–40.0)

## 2022-03-10 LAB — HEMOGLOBIN A1C: Hgb A1c MFr Bld: 7.5 % — ABNORMAL HIGH (ref 4.6–6.5)

## 2022-03-10 MED ORDER — ZOLPIDEM TARTRATE 10 MG PO TABS
5.0000 mg | ORAL_TABLET | Freq: Every evening | ORAL | 3 refills | Status: DC | PRN
Start: 2022-03-10 — End: 2022-07-21

## 2022-03-10 MED ORDER — RYBELSUS 3 MG PO TABS: 3.0000 mg | ORAL_TABLET | Freq: Every day | ORAL | 3 refills | Status: DC

## 2022-03-10 NOTE — Assessment & Plan Note (Signed)
Start Rybelsus - wt loss should help

## 2022-03-10 NOTE — Assessment & Plan Note (Signed)
Cont w/meds Start Rybelsus - wt loss should help

## 2022-03-10 NOTE — Assessment & Plan Note (Signed)
Worse Try Zolpidem prn  Potential benefits of a long term opioids use as well as potential risks (i.e. addiction risk, apnea etc) and complications (i.e. Somnolence, constipation and others) were explained to the patient and were aknowledged.

## 2022-03-10 NOTE — Progress Notes (Signed)
Subjective:  Patient ID: Renee Mann, female    DOB: Dec 28, 1955  Age: 66 y.o. MRN: [id number]  CC: No chief complaint on file.   HPI Renee Mann presents for DM, HTN, obesity   Outpatient Medications Prior to Visit  Medication Sig Dispense Refill   Accu-Chek Softclix Lancets lancets USE TO CHECK BLOOD SUGAR DAILY 100 each 3   amLODipine (NORVASC) 5 MG tablet TAKE ONE TABLET BY MOUTH DAILY 90 tablet 2   blood glucose meter kit and supplies Dispense based on patient and insurance preference. Use up to four times daily as directed. (FOR ICD-10 E10.9, E11.9). 1 each 0   Blood Glucose Monitoring Suppl (ACCU-CHEK AVIVA PLUS) w/Device KIT Use to check blood sugar daily. DX E11.9 1 kit 0   Cholecalciferol (VITAMIN D3) 50 MCG (2000 UT) capsule Take 1 capsule (2,000 Units total) by mouth daily. 100 capsule 3   diphenhydrAMINE (BENADRYL) 25 MG tablet Take 25 mg by mouth every 6 (six) hours as needed.     glucose blood (ACCU-CHEK GUIDE) test strip USE TO CHECK BLOOD SUGARS ONCE A DAY 90 strip 3   IBUPROFEN PO Take by mouth as needed.     meloxicam (MOBIC) 15 MG tablet TAKE 1 TABLET BY MOUTH EVERY DAY AS NEEDED FOR PAIN 30 tablet 1   metFORMIN (GLUCOPHAGE-XR) 750 MG 24 hr tablet TAKE 1 TABLET BY MOUTH ONCE DAILY WITH BREAKFAST 90 tablet 3   repaglinide (PRANDIN) 1 MG tablet TAKE 1 TABLET BY MOUTH 3 TIMES DAILY BEFORE MEAL(S) 270 tablet 1   triamcinolone cream (KENALOG) 0.1 % Apply 1 Application topically 2 (two) times daily. 30 g 5   EPINEPHrine (EPIPEN 2-PAK) 0.3 mg/0.3 mL IJ SOAJ injection Inject 0.3 mg into the muscle as needed for anaphylaxis. (Patient not taking: Reported on 02/25/2022) 1 each 1   Facility-Administered Medications Prior to Visit  Medication Dose Route Frequency Provider Last Rate Last Admin   omalizumab Arvid Right) injection 300 mg  300 mg Subcutaneous Q28 days Clemon Chambers, MD   300 mg at 02/18/22 1540    ROS: Review of Systems  Constitutional:  Negative for activity  change, appetite change, chills, fatigue and unexpected weight change.  HENT:  Negative for congestion, mouth sores and sinus pressure.   Eyes:  Negative for visual disturbance.  Respiratory:  Negative for cough and chest tightness.   Gastrointestinal:  Negative for abdominal pain and nausea.  Genitourinary:  Negative for difficulty urinating, frequency and vaginal pain.  Musculoskeletal:  Positive for arthralgias. Negative for back pain and gait problem.  Skin:  Negative for pallor and rash.  Neurological:  Negative for dizziness, tremors, weakness, numbness and headaches.  Psychiatric/Behavioral:  Positive for sleep disturbance. Negative for confusion and suicidal ideas.     Objective:  BP (!) 150/78 (BP Location: Left Arm, Patient Position: Sitting, Cuff Size: Normal)   Pulse 83   Temp 97.9 F (36.6 C) (Oral)   Ht 4' 10"  (1.473 m)   Wt 142 lb (64.4 kg)   SpO2 96%   BMI 29.68 kg/m   BP Readings from Last 3 Encounters:  03/10/22 (!) 150/78  02/25/22 (!) 142/82  12/25/21 (!) 143/96    Wt Readings from Last 3 Encounters:  03/10/22 142 lb (64.4 kg)  02/25/22 146 lb (66.2 kg)  12/25/21 145 lb (65.8 kg)    Physical Exam Constitutional:      General: She is not in acute distress.    Appearance: She is well-developed. She  is obese.  HENT:     Head: Normocephalic.     Right Ear: External ear normal.     Left Ear: External ear normal.     Nose: Nose normal.  Eyes:     General:        Right eye: No discharge.        Left eye: No discharge.     Conjunctiva/sclera: Conjunctivae normal.     Pupils: Pupils are equal, round, and reactive to light.  Neck:     Thyroid: No thyromegaly.     Vascular: No JVD.     Trachea: No tracheal deviation.  Cardiovascular:     Rate and Rhythm: Normal rate and regular rhythm.     Heart sounds: Normal heart sounds.  Pulmonary:     Effort: No respiratory distress.     Breath sounds: No stridor. No wheezing.  Abdominal:     General: Bowel  sounds are normal. There is no distension.     Palpations: Abdomen is soft. There is no mass.     Tenderness: There is no abdominal tenderness. There is no guarding or rebound.  Musculoskeletal:        General: No tenderness.     Cervical back: Normal range of motion and neck supple. No rigidity.     Right lower leg: No edema.     Left lower leg: No edema.  Lymphadenopathy:     Cervical: No cervical adenopathy.  Skin:    Findings: No erythema or rash.  Neurological:     Mental Status: She is oriented to person, place, and time.     Cranial Nerves: No cranial nerve deficit.     Motor: No abnormal muscle tone.     Coordination: Coordination normal.     Deep Tendon Reflexes: Reflexes normal.  Psychiatric:        Behavior: Behavior normal.        Thought Content: Thought content normal.        Judgment: Judgment normal.     Lab Results  Component Value Date   WBC 9.9 12/25/2021   HGB 13.2 12/25/2021   HCT 40.9 12/25/2021   PLT 428 (H) 12/25/2021   GLUCOSE 161 (H) 12/25/2021   CHOL 144 12/30/2020   TRIG 73 12/30/2020   HDL 40 12/30/2020   LDLCALC 89 12/30/2020   ALT 45 (H) 12/08/2021   AST 29 12/08/2021   NA 138 12/25/2021   K 4.0 12/25/2021   CL 103 12/25/2021   CREATININE 0.93 12/25/2021   BUN 10 12/25/2021   CO2 23 12/25/2021   TSH 1.58 12/30/2020   HGBA1C 7.4 (H) 12/08/2021   MICROALBUR 1.6 09/16/2020    DG Chest Portable 1 View  Result Date: 12/25/2021 CLINICAL DATA:  Chest pain. EXAM: PORTABLE CHEST 1 VIEW COMPARISON:  Chest x-ray 08/31/2019. FINDINGS: The heart size and mediastinal contours are within normal limits. Both lungs are clear. The visualized skeletal structures are unremarkable. IMPRESSION: No active disease. Electronically Signed   By: Ronney Asters M.D.   On: 12/25/2021 20:51    Assessment & Plan:   Problem List Items Addressed This Visit     Benign essential hypertension    Cont w/meds Start Rybelsus - wt loss should help      Relevant  Orders   Comprehensive metabolic panel   Hemoglobin A1c   Lipid panel   Gastroesophageal reflux disease    Start Rybelsus - wt loss should help      Gout  Start Rybelsus - wt loss should help      INSOMNIA, PERSISTENT    Worse Try Zolpidem prn  Potential benefits of a long term opioids use as well as potential risks (i.e. addiction risk, apnea etc) and complications (i.e. Somnolence, constipation and others) were explained to the patient and were aknowledged.       Type 2 diabetes mellitus ([place])    Cont on Metformin and Prandin. Cont w/wt loss  Start Rybelsus if covered       Relevant Medications   Semaglutide (RYBELSUS) 3 MG TABS   Other Relevant Orders   Comprehensive metabolic panel   Hemoglobin A1c   Lipid panel      Meds ordered this encounter  Medications   zolpidem (AMBIEN) 10 MG tablet    Sig: Take 0.5-1 tablets (5-10 mg total) by mouth at bedtime as needed for sleep.    Dispense:  30 tablet    Refill:  3   DISCONTD: Semaglutide (RYBELSUS) 3 MG TABS    Sig: Take 3 mg by mouth daily.    Dispense:  30 tablet    Refill:  3   Semaglutide (RYBELSUS) 3 MG TABS    Sig: Take 3 mg by mouth daily.    Dispense:  30 tablet    Refill:  3      Follow-up: Return in about 3 months (around 06/10/2022) for a follow-up visit.  Walker Kehr, MD

## 2022-03-10 NOTE — Assessment & Plan Note (Addendum)
Cont on Metformin and Prandin. Cont w/wt loss  Start Rybelsus if covered

## 2022-03-18 ENCOUNTER — Ambulatory Visit (INDEPENDENT_AMBULATORY_CARE_PROVIDER_SITE_OTHER): Payer: Medicare Other

## 2022-03-18 DIAGNOSIS — L509 Urticaria, unspecified: Secondary | ICD-10-CM

## 2022-04-02 ENCOUNTER — Other Ambulatory Visit: Payer: Self-pay | Admitting: Internal Medicine

## 2022-04-15 ENCOUNTER — Other Ambulatory Visit: Payer: Self-pay | Admitting: Internal Medicine

## 2022-04-15 ENCOUNTER — Ambulatory Visit (INDEPENDENT_AMBULATORY_CARE_PROVIDER_SITE_OTHER): Payer: Medicare Other

## 2022-04-15 DIAGNOSIS — L509 Urticaria, unspecified: Secondary | ICD-10-CM | POA: Diagnosis not present

## 2022-04-29 ENCOUNTER — Other Ambulatory Visit: Payer: Self-pay | Admitting: Internal Medicine

## 2022-04-29 DIAGNOSIS — E119 Type 2 diabetes mellitus without complications: Secondary | ICD-10-CM

## 2022-04-30 ENCOUNTER — Other Ambulatory Visit: Payer: Self-pay | Admitting: Internal Medicine

## 2022-05-13 ENCOUNTER — Ambulatory Visit: Payer: Medicare Other

## 2022-05-18 ENCOUNTER — Ambulatory Visit
Admission: EM | Admit: 2022-05-18 | Discharge: 2022-05-18 | Disposition: A | Discharge status: Home / Self Care | Payer: Medicare Other | Attending: Physician Assistant | Admitting: Physician Assistant

## 2022-05-18 DIAGNOSIS — M25562 Pain in left knee: Secondary | ICD-10-CM | POA: Diagnosis not present

## 2022-05-18 MED ORDER — PREDNISONE 20 MG PO TABS: 40.0000 mg | ORAL_TABLET | Freq: Every day | ORAL | 0 refills | Status: AC

## 2022-05-18 NOTE — ED Triage Notes (Signed)
Pt presents with left knee pain since waking up this morning; pt said she had a near fall on some steps X 2 days ago.

## 2022-05-19 ENCOUNTER — Encounter: Payer: Self-pay | Admitting: Physician Assistant

## 2022-05-19 NOTE — ED Provider Notes (Signed)
[name]    CSN: [id number] Arrival date & time: 05/18/22  1549      History   Chief Complaint Chief Complaint  Patient presents with   Knee Pain    HPI Renee Mann is a 66 y.o. female.   Patient here today for evaluation of left knee pain that started when she woke this morning.  She notes that she did have a near fall down steps a few days ago but did not actually fall.  She notes that she accidentally missed a step and is not sure if she twisted her knee at this time.  She denies any immediate pain.  She does not report any numbness or tingling.  She does report that pain does radiate from her posterior knee into her calf area at times.  There is no apparent swelling.  The history is provided by the patient.  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    Patient Active Problem List   Diagnosis Date Noted   Trigger thumb of right hand 02/04/2021   Edema 05/16/2020   Fatigue 01/29/2020   Neck muscle spasm 07/28/2019   Lumbar radiculopathy, right 05/08/2019   Sciatica 04/11/2019   Tachycardia 10/10/2018   Hand cramps 04/11/2018   Hypertensive disorder 05/20/2017   Hand swelling 01/22/2017   Rash 12/15/2016   Urticaria 08/04/2016   Neoplasm of uncertain behavior of skin 04/17/2016   Gout 03/20/2015   Well adult exam 03/20/2015   Greater trochanteric bursitis of right hip 05/17/2013   Hip pain 01/06/2013   Gastroesophageal reflux disease 07/16/2010   URI (upper respiratory infection) 11/28/2009   Osteoarthritis 03/27/2008   INSOMNIA, PERSISTENT 07/13/2007   Benign essential hypertension 07/13/2007   Type 2 diabetes mellitus ([place]) 06/20/2007    Past Surgical History:  Procedure Laterality Date   ABDOMINAL HYSTERECTOMY  1987   burritis surgery Left  1998   hip    OB History   No obstetric history on file.      Home Medications    Prior to Admission medications   Medication Sig Start Date End Date Taking? Authorizing Provider  predniSONE (DELTASONE) 20 MG tablet Take 2 tablets (40 mg total) by mouth daily with breakfast for 5 days. 05/18/22 05/23/22 Yes Francene Finders, PA-C  ACCU-CHEK GUIDE test strip USE TO CHECK BLOOD SUGARS ONCE A DAY 04/30/22   Plotnikov, [name], MD  Accu-Chek Softclix Lancets lancets USE TO CHECK BLOOD SUGAR DAILY 04/30/22   Plotnikov, [name], MD  amLODipine (NORVASC) 5 MG tablet TAKE 1 TABLET BY MOUTH DAILY 04/15/22   Plotnikov, [name], MD  blood glucose meter kit and supplies Dispense based on patient and insurance preference. Use up to four times daily as directed. (FOR ICD-10 E10.9, E11.9). 09/16/20   Plotnikov, [name], MD  Blood Glucose Monitoring Suppl (ACCU-CHEK AVIVA PLUS) w/Device KIT Use to check blood sugar daily. DX E11.9 10/10/18   Plotnikov, [name], MD  Cholecalciferol (VITAMIN D3) 50 MCG (2000 UT) capsule Take 1 capsule (2,000 Units total) by mouth daily. 01/29/22   Plotnikov, [name], MD  diphenhydrAMINE (BENADRYL) 25 MG tablet Take 25 mg by mouth every 6 (six) hours as needed.  [provider]  IBUPROFEN PO Take by mouth as needed.    [provider]  meloxicam (MOBIC) 15 MG tablet TAKE 1 TABLET BY MOUTH EVERY DAY AS NEEDED FOR PAIN 10/30/21   Plotnikov, [name], MD  metFORMIN (GLUCOPHAGE-XR) 750 MG 24 hr tablet TAKE 1 TABLET BY MOUTH ONCE DAILY WITH BREAKFAST 12/24/21   Plotnikov, [name], MD  repaglinide (PRANDIN) 1 MG tablet TAKE 1 TABLET BY MOUTH 3 TIMES DAILY BEFORE MEAL(S) 12/01/21   Plotnikov, [name], MD  Semaglutide (RYBELSUS) 3 MG TABS Take 3 mg by mouth daily. 03/10/22   Plotnikov, [name], MD  triamcinolone cream (KENALOG) 0.1 % Apply 1 Application topically 2 (two) times daily. 02/25/22   Clemon Chambers, MD  zolpidem (AMBIEN) 10 MG tablet Take 0.5-1  tablets (5-10 mg total) by mouth at bedtime as needed for sleep. 03/10/22   Plotnikov, [name], MD    Family History Family History  Problem Relation Age of Onset   Hypertension Mother    Cancer Mother    Stroke Mother    Hypertension Sister    Hypertension Other    Colon cancer Neg Hx    Esophageal cancer Neg Hx    Stomach cancer Neg Hx    Rectal cancer Neg Hx     Social History Social History   Tobacco Use   Smoking status: Former    Types: Cigarettes    Quit date: 08/24/1998    Years since quitting: 23.7   Smokeless tobacco: Never  Vaping Use   Vaping Use: Never used  Substance Use Topics   Alcohol use: No    Alcohol/week: 0.0 standard drinks of alcohol   Drug use: No     Allergies   Codeine, Atenolol, and Hydrochlorothiazide w-triamterene   Review of Systems Review of Systems  Constitutional:  Negative for chills and fever.  Eyes:  Negative for discharge and redness.  Respiratory:  Negative for shortness of breath.   Gastrointestinal:  Negative for nausea and vomiting.  Musculoskeletal:  Positive for arthralgias. Negative for joint swelling.  Skin:  Negative for color change and wound.  Neurological:  Negative for numbness.     Physical Exam Triage Vital Signs ED Triage Vitals  Enc Vitals Group     BP 05/18/22 1649 (!) 176/82     Pulse Rate 05/18/22 1649 98     Resp 05/18/22 1649 17     Temp 05/18/22 1649 97.9 F (36.6 C)     Temp Source 05/18/22 1649 Oral     SpO2 05/18/22 1649 98 %     Weight --      Height --      Head Circumference --      Peak Flow --      Pain Score 05/18/22 1651 8     Pain Loc --      Pain Edu? --      Excl. in [place]? --    No data found.  Updated Vital Signs BP (!) 176/82 (BP Location: Left Arm)   Pulse 98   Temp 97.9 F (36.6 C) (Oral)   Resp 17   SpO2 98%       Physical Exam Vitals and nursing note reviewed.  Constitutional:      General: She is not in acute distress.    Appearance: Normal appearance.  She is not ill-appearing.  HENT:     Head: Normocephalic and atraumatic.  Eyes:     Conjunctiva/sclera: Conjunctivae normal.  Cardiovascular:     Rate and Rhythm: Normal rate.  Pulmonary:     Effort: Pulmonary effort is normal.  Musculoskeletal:     Comments: No significant TTP to left anterior knee, mild TTP to posterior left knee diffusely. Decreased Flexion and Extension of left knee due to pain. No swelling or erythema appreciated to left calf  Neurological:     Mental Status: She is alert.  Psychiatric:        Mood and Affect: Mood normal.        Behavior: Behavior normal.        Thought Content: Thought content normal.      UC Treatments / Results  Labs (all labs ordered are listed, but only abnormal results are displayed) Labs Reviewed - No data to display  EKG   Radiology No results found.  Procedures Procedures (including critical care time)  Medications Ordered in UC Medications - No data to display  Initial Impression / Assessment and Plan / UC Course  I have reviewed the triage vital signs and the nursing notes.  Pertinent labs & imaging results that were available during my care of the patient were reviewed by me and considered in my medical decision making (see chart for details).    Will treat with steroid burst in hopes to improve symptoms and knee brace provided in office. Encouraged follow up with ortho if symptoms persist over the next week as she may need further evaluation/ imaging. Patient expresses understanding.   Final Clinical Impressions(s) / UC Diagnoses   Final diagnoses:  Acute pain of left knee   Discharge Instructions   None    ED Prescriptions     Medication Sig Dispense Auth. Provider   predniSONE (DELTASONE) 20 MG tablet Take 2 tablets (40 mg total) by mouth daily with breakfast for 5 days. 10 tablet Francene Finders, PA-C      PDMP not reviewed this encounter.   Francene Finders, PA-C 05/19/22 [phone]

## 2022-05-25 NOTE — Progress Notes (Unsigned)
FOLLOW UP Date of Service/Encounter:  05/27/22   Subjective:  Renee Mann (DOB: Sep 22, 1955) is a 66 y.o. female who [facility] in re-evaluation of the following: Pruritic rash History obtained from: chart review and patient.  For Review, LV was on 02/25/22  with Dr.Ludwig Tugwell seen for routine follow-up.  At this visit her hives had improved significantly but still having some itching at night.  She was wiping herself down with alcohol wipes which we advised her against.  Her last Xolair injection was 04/15/2022  Pertinent history/diagnostics: Patient previously on Xolair but stopped in 2021 due to an insurance issue.  Hives are controlled at that time so she continued on antihistamines and topical steroids.  However in March 2023, hives returned and she wanted to restart Xolair which was started in April 2023.  Today presents for follow-up. She has now received 5 Xolair injections, but reports that her rash has really not improved.  She has dark spots on her back and under her breast which are extremely pruritic and do not move around.  She denies any rashes in her armpits or finger or toe webs.  She does occasionally have red hivey bumps but rarely. No prior history of eczema. No previous biopsies. Itching is not controlled.  Rash is now in her groin area as well.    Allergies as of 05/27/2022       Reactions   Codeine Hives   Atenolol Other (See Comments)   Weird feelings   Hydrochlorothiazide W-triamterene Other (See Comments)   headache        Medication List        Accurate as of May 27, 2022  1:18 PM. If you have any questions, ask your nurse or doctor.          Accu-Chek Aviva Plus w/Device Kit Use to check blood sugar daily. DX E11.9   Accu-Chek Guide test strip Generic drug: glucose blood USE TO CHECK BLOOD SUGARS ONCE A DAY   Accu-Chek Softclix Lancets lancets USE TO CHECK BLOOD SUGAR DAILY   amLODipine 5 MG  tablet Commonly known as: NORVASC TAKE 1 TABLET BY MOUTH DAILY   blood glucose meter kit and supplies Dispense based on patient and insurance preference. Use up to four times daily as directed. (FOR ICD-10 E10.9, E11.9).   diphenhydrAMINE 25 MG tablet Commonly known as: BENADRYL Take 25 mg by mouth every 6 (six) hours as needed.   IBUPROFEN PO Take by mouth as needed.   meloxicam 15 MG tablet Commonly known as: MOBIC TAKE 1 TABLET BY MOUTH EVERY DAY AS NEEDED FOR PAIN   metFORMIN 750 MG 24 hr tablet Commonly known as: GLUCOPHAGE-XR TAKE 1 TABLET BY MOUTH ONCE DAILY WITH BREAKFAST   repaglinide 1 MG tablet Commonly known as: PRANDIN TAKE 1 TABLET BY MOUTH 3 TIMES DAILY BEFORE MEAL(S)   Rybelsus 3 MG Tabs Generic drug: Semaglutide Take 3 mg by mouth daily.   triamcinolone cream 0.1 % Commonly known as: KENALOG Apply 1 Application topically 2 (two) times daily.   Vitamin D3 50 MCG (2000 UT) capsule Take 1 capsule (2,000 Units total) by mouth daily.   zolpidem 10 MG tablet Commonly known as: AMBIEN Take 0.5-1 tablets (5-10 mg total) by mouth at bedtime as needed for sleep.       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  ROS: All others negative except as noted per HPI.   Objective:  BP (!) 170/70   Pulse (!) 118   Temp 98.9 F (37.2 C) (Temporal)   Resp 20   Wt 144 lb 12.8 oz (65.7 kg)   SpO2 99%   BMI 30.26 kg/m  Body mass index is 30.26 kg/m. Physical Exam: General Appearance:  Alert, cooperative, no distress, appears stated age  Head:  Normocephalic, without  obvious abnormality, atraumatic  Eyes:  Conjunctiva clear, EOM's intact  Nose: Nares normal  Throat: Lips, tongue normal; teeth and gums normal, normal posterior oropharynx  Neck: Supple, symmetrical  Lungs:   clear to auscultation bilaterally, Respirations unlabored, no coughing  Heart:  regular rate and rhythm and no murmur, Appears well perfused  Extremities: No edema  Skin: Hyperpigmented macules scattered on lower back and upper abdomen with overlying excoriations  Neurologic: No gross deficits   Assessment/Plan   RASH-no longer having hives. Suspect atopic dermatitis with prurigo nodularis  1. Every day use zyrtec (cetirizine) 10 mg twice daily (max 40 mg/day = 2 tablets twice daily). Can use this in place of benadryl to avoid sleepiness.   2. Start triamcinolone ointment on body up to twice daily in areas of rash. Do not use on face, groin or arm pits. Will send in hydrocortisone 2.5% ointment for these areas.   3. Nasal saline spray as needed  4 Stop Xolair.  Start Dupixent loading dose 600 mg today then return every 2 weeks for 300 mg. I will send a message to Ocie Bob who will help with insurance piece.  Follow-up in 3 months, sooner if needed.  Tonny Bollman, MD  [facility]

## 2022-05-27 ENCOUNTER — Ambulatory Visit: Payer: Medicare Other

## 2022-05-27 ENCOUNTER — Ambulatory Visit (INDEPENDENT_AMBULATORY_CARE_PROVIDER_SITE_OTHER): Payer: Medicare Other | Admitting: Internal Medicine

## 2022-05-27 ENCOUNTER — Encounter: Payer: Self-pay | Admitting: Internal Medicine

## 2022-05-27 VITALS — BP 170/70 | HR 118 | Temp 98.9°F | Resp 20 | Ht <= 58 in | Wt 144.8 lb

## 2022-05-27 DIAGNOSIS — R21 Rash and other nonspecific skin eruption: Secondary | ICD-10-CM

## 2022-05-27 DIAGNOSIS — L299 Pruritus, unspecified: Secondary | ICD-10-CM | POA: Diagnosis not present

## 2022-05-27 DIAGNOSIS — L509 Urticaria, unspecified: Secondary | ICD-10-CM | POA: Diagnosis not present

## 2022-05-27 DIAGNOSIS — L281 Prurigo nodularis: Secondary | ICD-10-CM | POA: Diagnosis not present

## 2022-05-27 DIAGNOSIS — L209 Atopic dermatitis, unspecified: Secondary | ICD-10-CM

## 2022-05-27 DIAGNOSIS — L308 Other specified dermatitis: Secondary | ICD-10-CM

## 2022-05-27 MED ORDER — TRIAMCINOLONE ACETONIDE 0.1 % EX CREA: 1.0000 | TOPICAL_CREAM | Freq: Two times a day (BID) | CUTANEOUS | 1 refills | Status: DC | PRN

## 2022-05-27 MED ORDER — HYDROCORTISONE 2.5 % EX OINT: TOPICAL_OINTMENT | CUTANEOUS | 3 refills | Status: AC

## 2022-05-27 MED ORDER — DUPILUMAB 300 MG/2ML ~~LOC~~ SOSY
600.0000 mg | PREFILLED_SYRINGE | Freq: Once | SUBCUTANEOUS | Status: AC
  Administered 2022-05-27: 600 mg via SUBCUTANEOUS

## 2022-05-27 NOTE — Patient Instructions (Addendum)
RASH-no longer having hives. Suspect atopic dermatitis with prurigo nodularis 1. Every day use zyrtec (cetirizine) 10 mg twice daily (max 40 mg/day = 2 tablets twice daily). Can use this in place of benadryl to avoid sleepiness.   2. Start triamcinolone ointment on body up to twice daily in areas of rash. Do not use on face, groin or arm pits. Will send in hydrocortisone 2.5% ointment for these areas.   3. Nasal saline spray as needed  4 Stop Xolair.  Start Dupixent loading dose 600 mg today then return every 2 weeks for 300 mg. I will send a message to Caprice Beaver who will help with insurance piece.  Follow-up in 3 months, sooner if needed. It was a pleasure seeing you in clinic again today.

## 2022-06-08 ENCOUNTER — Telehealth: Payer: Self-pay | Admitting: *Deleted

## 2022-06-08 ENCOUNTER — Ambulatory Visit (INDEPENDENT_AMBULATORY_CARE_PROVIDER_SITE_OTHER): Payer: Medicare Other

## 2022-06-08 VITALS — Ht <= 58 in | Wt 141.0 lb

## 2022-06-08 DIAGNOSIS — Z0000 Encounter for general adult medical examination without abnormal findings: Secondary | ICD-10-CM

## 2022-06-08 DIAGNOSIS — Z780 Asymptomatic menopausal state: Secondary | ICD-10-CM

## 2022-06-08 DIAGNOSIS — Z1231 Encounter for screening mammogram for malignant neoplasm of breast: Secondary | ICD-10-CM

## 2022-06-08 NOTE — Telephone Encounter (Signed)
Called patient and discussed Dupixent through PAP since she has medicare and would be unaffordable.  Patient will bring her part D card in Thursday and sign consent. I will be intouch regarding PAP status

## 2022-06-08 NOTE — Telephone Encounter (Signed)
-----   Message from Clemon Chambers, MD sent at 05/27/2022  1:23 PM EDT ----- [place] Border.  I am stopping her Xolair because it is not working and it looks like she now has prurigo nodularis so we switched to [place].  I gave her a loading dose in clinic.  Can you help seeing about getting this approved.  Also added eczema to diagnosis code.

## 2022-06-08 NOTE — Patient Instructions (Signed)
[name] , Thank you for taking time to come for your Medicare Wellness Visit. I appreciate your ongoing commitment to your health goals. Please review the following plan we discussed and let me know if I can assist you in the future.   These are the goals we discussed:  Goals      Exercise 3x per week (30 min per time)        This is a list of the screening recommended for you and due dates:  Health Maintenance  Topic Date Due   Eye exam for diabetics  Never done   DEXA scan (bone density measurement)  Never done   Mammogram  07/03/2021   COVID-19 Vaccine (6 - Pfizer risk series) 07/29/2021   Yearly kidney health urinalysis for diabetes  09/16/2021   Complete foot exam   09/16/2021   Hemoglobin A1C  09/10/2022   Yearly kidney function blood test for diabetes  03/11/2023   Tetanus Vaccine  03/19/2025   Colon Cancer Screening  06/29/2026   Pneumonia Vaccine  Completed   Hepatitis C Screening: USPSTF Recommendation to screen - Ages 18-79 yo.  Completed   HPV Vaccine  Aged Out   Flu Shot  Discontinued   Zoster (Shingles) Vaccine  Discontinued    Advanced directives: Advance directive discussed with you today. I have provided a copy for you to complete at home and have notarized. Once this is complete please bring a copy in to our office so we can scan it into your chart.   Conditions/risks identified: Aim for 30 minutes of exercise or brisk walking, 6-8 glasses of water, and 5 servings of fruits and vegetables each day.   Next appointment: Follow up in one year for your annual wellness visit    Preventive Care 65 Years and Older, Female Preventive care refers to lifestyle choices and visits with your health care provider that can promote health and wellness. What does preventive care include? A yearly physical exam. This is also called an annual well check. Dental exams once or twice a year. Routine eye exams. Ask your health care provider how often you should have your eyes  checked. Personal lifestyle choices, including: Daily care of your teeth and gums. Regular physical activity. Eating a healthy diet. Avoiding tobacco and drug use. Limiting alcohol use. Practicing safe sex. Taking low-dose aspirin every day. Taking vitamin and mineral supplements as recommended by your health care provider. What happens during an annual well check? The services and screenings done by your health care provider during your annual well check will depend on your age, overall health, lifestyle risk factors, and family history of disease. Counseling  Your health care provider may ask you questions about your: Alcohol use. Tobacco use. Drug use. Emotional well-being. Home and relationship well-being. Sexual activity. Eating habits. History of falls. Memory and ability to understand (cognition). Work and work Statistician. Reproductive health. Screening  You may have the following tests or measurements: Height, weight, and BMI. Blood pressure. Lipid and cholesterol levels. These may be checked every 5 years, or more frequently if you are over 45 years old. Skin check. Lung cancer screening. You may have this screening every year starting at age 74 if you have a 30-pack-year history of smoking and currently smoke or have quit within the past 15 years. Fecal occult blood test (FOBT) of the stool. You may have this test every year starting at age 47. Flexible sigmoidoscopy or colonoscopy. You may have a sigmoidoscopy every 5 years  or a colonoscopy every 10 years starting at age 51. Hepatitis C blood test. Hepatitis B blood test. Sexually transmitted disease (STD) testing. Diabetes screening. This is done by checking your blood sugar (glucose) after you have not eaten for a while (fasting). You may have this done every 1-3 years. Bone density scan. This is done to screen for osteoporosis. You may have this done starting at age 93. Mammogram. This may be done every 1-2  years. Talk to your health care provider about how often you should have regular mammograms. Talk with your health care provider about your test results, treatment options, and if necessary, the need for more tests. Vaccines  Your health care provider may recommend certain vaccines, such as: Influenza vaccine. This is recommended every year. Tetanus, diphtheria, and acellular pertussis (Tdap, Td) vaccine. You may need a Td booster every 10 years. Zoster vaccine. You may need this after age 62. Pneumococcal 13-valent conjugate (PCV13) vaccine. One dose is recommended after age 54. Pneumococcal polysaccharide (PPSV23) vaccine. One dose is recommended after age 7. Talk to your health care provider about which screenings and vaccines you need and how often you need them. This information is not intended to replace advice given to you by your health care provider. Make sure you discuss any questions you have with your health care provider. Document Released: 09/06/2015 Document Revised: 04/29/2016 Document Reviewed: 06/11/2015 Elsevier Interactive Patient Education  2017 [place] Prevention in the Home Falls can cause injuries. They can happen to people of all ages. There are many things you can do to make your home safe and to help prevent falls. What can I do on the outside of my home? Regularly fix the edges of walkways and driveways and fix any cracks. Remove anything that might make you trip as you walk through a door, such as a raised step or threshold. Trim any bushes or trees on the path to your home. Use bright outdoor lighting. Clear any walking paths of anything that might make someone trip, such as rocks or tools. Regularly check to see if handrails are loose or broken. Make sure that both sides of any steps have handrails. Any raised decks and porches should have guardrails on the edges. Have any leaves, snow, or ice cleared regularly. Use sand or salt on walking paths  during winter. Clean up any spills in your garage right away. This includes oil or grease spills. What can I do in the bathroom? Use night lights. Install grab bars by the toilet and in the tub and shower. Do not use towel bars as grab bars. Use non-skid mats or decals in the tub or shower. If you need to sit down in the shower, use a plastic, non-slip stool. Keep the floor dry. Clean up any water that spills on the floor as soon as it happens. Remove soap buildup in the tub or shower regularly. Attach bath mats securely with double-sided non-slip rug tape. Do not have throw rugs and other things on the floor that can make you trip. What can I do in the bedroom? Use night lights. Make sure that you have a light by your bed that is easy to reach. Do not use any sheets or blankets that are too big for your bed. They should not hang down onto the floor. Have a firm chair that has side arms. You can use this for support while you get dressed. Do not have throw rugs and other things on the floor  that can make you trip. What can I do in the kitchen? Clean up any spills right away. Avoid walking on wet floors. Keep items that you use a lot in easy-to-reach places. If you need to reach something above you, use a strong step stool that has a grab bar. Keep electrical cords out of the way. Do not use floor polish or wax that makes floors slippery. If you must use wax, use non-skid floor wax. Do not have throw rugs and other things on the floor that can make you trip. What can I do with my stairs? Do not leave any items on the stairs. Make sure that there are handrails on both sides of the stairs and use them. Fix handrails that are broken or loose. Make sure that handrails are as long as the stairways. Check any carpeting to make sure that it is firmly attached to the stairs. Fix any carpet that is loose or worn. Avoid having throw rugs at the top or bottom of the stairs. If you do have throw  rugs, attach them to the floor with carpet tape. Make sure that you have a light switch at the top of the stairs and the bottom of the stairs. If you do not have them, ask someone to add them for you. What else can I do to help prevent falls? Wear shoes that: Do not have high heels. Have rubber bottoms. Are comfortable and fit you well. Are closed at the toe. Do not wear sandals. If you use a stepladder: Make sure that it is fully opened. Do not climb a closed stepladder. Make sure that both sides of the stepladder are locked into place. Ask someone to hold it for you, if possible. Clearly mark and make sure that you can see: Any grab bars or handrails. First and last steps. Where the edge of each step is. Use tools that help you move around (mobility aids) if they are needed. These include: Canes. Walkers. Scooters. Crutches. Turn on the lights when you go into a dark area. Replace any light bulbs as soon as they burn out. Set up your furniture so you have a clear path. Avoid moving your furniture around. If any of your floors are uneven, fix them. If there are any pets around you, be aware of where they are. Review your medicines with your doctor. Some medicines can make you feel dizzy. This can increase your chance of falling. Ask your doctor what other things that you can do to help prevent falls. This information is not intended to replace advice given to you by your health care provider. Make sure you discuss any questions you have with your health care provider. Document Released: 06/06/2009 Document Revised: 01/16/2016 Document Reviewed: 09/14/2014 Elsevier Interactive Patient Education  2017 [employer].

## 2022-06-08 NOTE — Progress Notes (Addendum)
Subjective:   Renee Mann is a 66 y.o. female who presents for an Initial Medicare Annual Wellness Visit.   I connected with  Alina A Laursen on 06/08/22 by a audio enabled telemedicine application and verified that I am speaking with the correct person using two identifiers.  Patient Location: Home  Provider Location: Home Office  I discussed the limitations of evaluation and management by telemedicine. The patient expressed understanding and agreed to proceed.  Review of Systems     Cardiac Risk Factors include: advanced age (>18mn, >>63women);hypertension;diabetes mellitus     Objective:    Today's Vitals   06/08/22 0820  Weight: 141 lb (64 kg)  Height: 4' 10"  (1.473 m)   Body mass index is 29.47 kg/m.     06/08/2022    8:26 AM 12/25/2021    8:24 PM 10/27/2019    4:23 PM 12/05/2016    5:29 PM 06/15/2016   10:24 AM  Advanced Directives  Does Patient Have a Medical Advance Directive? No No No No No  Would patient like information on creating a medical advance directive? No - Patient declined        Current Medications (verified) Outpatient Encounter Medications as of 06/08/2022  Medication Sig   ACCU-CHEK GUIDE test strip USE TO CHECK BLOOD SUGARS ONCE A DAY   Accu-Chek Softclix Lancets lancets USE TO CHECK BLOOD SUGAR DAILY   amLODipine (NORVASC) 5 MG tablet TAKE 1 TABLET BY MOUTH DAILY   blood glucose meter kit and supplies Dispense based on patient and insurance preference. Use up to four times daily as directed. (FOR ICD-10 E10.9, E11.9).   Blood Glucose Monitoring Suppl (ACCU-CHEK AVIVA PLUS) w/Device KIT Use to check blood sugar daily. DX E11.9   Cholecalciferol (VITAMIN D3) 50 MCG (2000 UT) capsule Take 1 capsule (2,000 Units total) by mouth daily.   diphenhydrAMINE (BENADRYL) 25 MG tablet Take 25 mg by mouth every 6 (six) hours as needed.   hydrocortisone 2.5 % ointment Apply topically twice daily as need to red sandpapery rash.   IBUPROFEN PO Take by mouth as  needed.   meloxicam (MOBIC) 15 MG tablet TAKE 1 TABLET BY MOUTH EVERY DAY AS NEEDED FOR PAIN   metFORMIN (GLUCOPHAGE-XR) 750 MG 24 hr tablet TAKE 1 TABLET BY MOUTH ONCE DAILY WITH BREAKFAST   repaglinide (PRANDIN) 1 MG tablet TAKE 1 TABLET BY MOUTH 3 TIMES DAILY BEFORE MEAL(S)   Semaglutide (RYBELSUS) 3 MG TABS Take 3 mg by mouth daily.   triamcinolone cream (KENALOG) 0.1 % Apply 1 Application topically 2 (two) times daily as needed. Do not use on face, groin or armpits.   zolpidem (AMBIEN) 10 MG tablet Take 0.5-1 tablets (5-10 mg total) by mouth at bedtime as needed for sleep.   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   Past Surgical History:  Procedure Laterality Date   ABDOMINAL HYSTERECTOMY  1987   burritis surgery Left 1998   hip   Family History  Problem Relation Age of Onset   Hypertension Mother    Cancer Mother  Stroke Mother    Hypertension Sister    Hypertension Other    Colon cancer Neg Hx    Esophageal cancer Neg Hx    Stomach cancer Neg Hx    Rectal cancer Neg Hx    Social History   Socioeconomic History   Marital status: Married    Spouse name: Not on file   Number of children: 1   Years of education: Not on file   Highest education level: Not on file  Occupational History   Occupation: housewife    Comment: staying 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  Tobacco Use   Smoking status: Former    Types: Cigarettes    Quit date: 08/24/1998    Years since quitting: 23.8   Smokeless tobacco: Never  Vaping Use   Vaping Use: Never used  Substance and Sexual Activity    Alcohol use: No    Alcohol/week: 0.0 standard drinks of alcohol   Drug use: No   Sexual activity: Yes  Other Topics Concern   Not on file  Social History Narrative   ** Merged History Encounter **       Social Determinants of Health   Financial Resource Strain: Low Risk  (06/08/2022)   Overall Financial Resource Strain (CARDIA)    Difficulty of Paying Living Expenses: Not hard at all  Food Insecurity: No Food Insecurity (06/08/2022)   Hunger Vital Sign    Worried About Running Out of Food in the Last Year: Never true    [place] in the Last Year: Never true  Transportation Needs: No Transportation Needs (06/08/2022)   PRAPARE - Hydrologist (Medical): No    Lack of Transportation (Non-Medical): No  Physical Activity: Insufficiently Active (06/08/2022)   Exercise Vital Sign    Days of Exercise per Week: 3 days    Minutes of Exercise per Session: 30 min  Stress: No Stress Concern Present (06/08/2022)   [place]    Feeling of Stress : Not at all  Social Connections: Moderately Isolated (06/08/2022)   Social Connection and Isolation Panel [NHANES]    Frequency of Communication with Friends and Family: More than three times a week    Frequency of Social Gatherings with Friends and Family: More than three times a week    Attends Religious Services: Never    Marine scientist or Organizations: No    Attends Music therapist: Never    Marital Status: Married    Tobacco Counseling Counseling given: Not Answered   Clinical Intake:  Pre-visit preparation completed: Yes  Pain : No/denies pain     Nutritional Risks: None Diabetes: No  How often do you need to have someone help you when you read instructions, pamphlets, or other written materials from your doctor or pharmacy?: 1 - Never  Diabetic?yes Nutrition Risk Assessment:  Has the patient had any  N/V/D within the last 2 months?  No  Does the patient have any non-healing wounds?  No  Has the patient had any unintentional weight loss or weight gain?  No   Diabetes:  Is the patient diabetic?  Yes  If diabetic, was a CBG obtained today?  No  Did the patient bring in their glucometer from home?  No  How often do you monitor your CBG's? Daily .   Financial Strains and Diabetes Management:  Are you having any financial strains with the device, your supplies or  your medication? No .  Does the patient want to be seen by Chronic Care Management for management of their diabetes?  No  Would the patient like to be referred to a Nutritionist or for Diabetic Management?  No   Diabetic Exams:  Diabetic Eye Exam: Completed 10/2021 Diabetic Foot Exam: Overdue, Pt has been advised about the importance in completing this exam. Pt is scheduled for diabetic foot exam on Next office visit .   Interpreter Needed?: No  Information entered by :: Jadene Pierini, LPN   Activities of Daily Living    06/08/2022    8:27 AM  In your present state of health, do you have any difficulty performing the following activities:  Hearing? 0  Vision? 0  Difficulty concentrating or making decisions? 0  Walking or climbing stairs? 0  Dressing or bathing? 0  Doing errands, shopping? 0  Preparing Food and eating ? N  Using the Toilet? N  In the past six months, have you accidently leaked urine? N  Do you have problems with loss of bowel control? N  Managing your Medications? N  Managing your Finances? N  Housekeeping or managing your Housekeeping? N    Patient Care Team: Plotnikov, [name], MD as PCP - Jacklynn Lewis, MD (Orthopedic Surgery) Alden Hipp, MD (Obstetrics and Gynecology) Neldon Mc Donnamarie Poag, MD as Consulting Physician (Allergy and Immunology)  Indicate any recent Medical Services you may have received from other than [facility] providers in the past year (date may be approximate).      Assessment:   This is a routine wellness examination for Renee Mann.  Hearing/Vision screen Vision Screening - Comments:: Annual eye exams wear glasses   Dietary issues and exercise activities discussed: Current Exercise Habits: Home exercise routine, Type of exercise: walking, Time (Minutes): 30, Frequency (Times/Week): 3, Weekly Exercise (Minutes/Week): 90, Intensity: Mild, Exercise limited by: respiratory conditions(s)   Goals Addressed             This Visit's Progress    Exercise 3x per week (30 min per time)         Depression Screen    06/08/2022    8:24 AM 03/10/2022    8:08 AM 12/08/2021    8:06 AM 09/16/2020    8:24 AM 10/26/2019   10:40 AM 04/11/2019    7:52 AM 03/23/2018   11:58 AM  PHQ 2/9 Scores  PHQ - 2 Score 0 1 0 0 0 0 0    Fall Risk    06/08/2022    8:22 AM 03/10/2022    8:08 AM 12/08/2021    8:07 AM 09/16/2020    8:25 AM 10/26/2019   10:39 AM  Fall Risk   Falls in the past year? 0 0 0 0 0  Number falls in past yr: 0 0 0 0   Injury with Fall? 0 0 0 0   Risk for fall due to : No Fall Risks No Fall Risks  No Fall Risks   Follow up Falls prevention discussed Falls evaluation completed  Falls evaluation completed Falls evaluation completed    FALL RISK PREVENTION PERTAINING TO THE HOME:  Any stairs in or around the home? No  If so, are there any without handrails? No  Home free of loose throw rugs in walkways, pet beds, electrical cords, etc? Yes  Adequate lighting in your home to reduce risk of falls? Yes   ASSISTIVE DEVICES UTILIZED TO PREVENT FALLS:  Life alert? No  Use of a cane,  walker or w/c? No  Grab bars in the bathroom? No  Shower chair or bench in shower? No  Elevated toilet seat or a handicapped toilet? No    Immunizations Immunization History  Administered Date(s) Administered   PFIZER Comirnaty(Gray Top)Covid-19 Tri-Sucrose Vaccine 11/04/2019, 11/25/2019, 07/19/2020   PFIZER(Purple Top)SARS-COV-2 Vaccination 03/04/2021   Pfizer  Covid-19 Vaccine Bivalent Booster 2yr & up 06/03/2021   Pneumococcal Conjugate-13 07/13/2019   Pneumococcal Polysaccharide-23 09/16/2020   Tdap 03/20/2015    TDAP status: Up to date  Flu Vaccine status: Declined, Education has been provided regarding the importance of this vaccine but patient still declined. Advised may receive this vaccine at local pharmacy or Health Dept. Aware to provide a copy of the vaccination record if obtained from local pharmacy or Health Dept. Verbalized acceptance and understanding.  Pneumococcal vaccine status: Up to date  Covid-19 vaccine status: Completed vaccines  Qualifies for Shingles Vaccine? Yes   Zostavax completed No   Shingrix Completed?: No.    Education has been provided regarding the importance of this vaccine. Patient has been advised to call insurance company to determine out of pocket expense if they have not yet received this vaccine. Advised may also receive vaccine at local pharmacy or Health Dept. Verbalized acceptance and understanding.  Screening Tests Health Maintenance  Topic Date Due   OPHTHALMOLOGY EXAM  Never done   DEXA SCAN  Never done   MAMMOGRAM  07/03/2021   COVID-19 Vaccine (6 - Pfizer risk series) 07/29/2021   Diabetic kidney evaluation - Urine ACR  09/16/2021   FOOT EXAM  09/16/2021   HEMOGLOBIN A1C  09/10/2022   Diabetic kidney evaluation - GFR measurement  03/11/2023   TETANUS/TDAP  03/19/2025   COLONOSCOPY (Pts 45-414yrInsurance coverage will need to be confirmed)  06/29/2026   Pneumonia Vaccine 6576Years old  Completed   Hepatitis C Screening  Completed   HPV VACCINES  Aged Out   INFLUENZA VACCINE  Discontinued   Zoster Vaccines- Shingrix  Discontinued    Health Maintenance  Health Maintenance Due  Topic Date Due   OPHTHALMOLOGY EXAM  Never done   DEXA SCAN  Never done   MAMMOGRAM  07/03/2021   COVID-19 Vaccine (6 - Pfizer risk series) 07/29/2021   Diabetic kidney evaluation - Urine ACR  09/16/2021    FOOT EXAM  09/16/2021    Colorectal cancer screening: Type of screening: Colonoscopy. Completed 06-29-2016. Repeat every 10 years  Mammogram status: Completed 07/03/2021. Repeat every year  Bone Density status: Ordered 06/08/2022. Pt provided with contact info and advised to call to schedule appt.  Lung Cancer Screening: (Low Dose CT Chest recommended if Age 68-80ears, 30 pack-year currently smoking OR have quit w/in 15years.) does not qualify.   Lung Cancer Screening Referral: n/a  Additional Screening:  Hepatitis C Screening: does not qualify;   Vision Screening: Recommended annual ophthalmology exams for early detection of glaucoma and other disorders of the eye. Is the patient up to date with their annual eye exam?  Yes  Who is the provider or what is the name of the office in which the patient attends annual eye exams? Dr.Lee If pt is not established with a provider, would they like to be referred to a provider to establish care? No .   Dental Screening: Recommended annual dental exams for proper oral hygiene  Community Resource Referral / Chronic Care Management: CRR required this visit?  No   CCM required this visit?  No  Plan:     I have personally reviewed and noted the following in the patient's chart:   Medical and social history Use of alcohol, tobacco or illicit drugs  Current medications and supplements including opioid prescriptions. Patient is not currently taking opioid prescriptions. Functional ability and status Nutritional status Physical activity Advanced directives List of other physicians Hospitalizations, surgeries, and ER visits in previous 12 months Vitals Screenings to include cognitive, depression, and falls Referrals and appointments  In addition, I have reviewed and discussed with patient certain preventive protocols, quality metrics, and best practice recommendations. A written personalized care plan for preventive services as well  as general preventive health recommendations were provided to patient.     Daphane Shepherd, LPN   29/11/7531   Nurse Notes: Due Flu Vaccine    Medical screening examination/treatment/procedure(s) were performed by non-physician practitioner and as supervising physician I was immediately available for consultation/collaboration.  I agree with above. Lew Dawes, MD

## 2022-06-09 ENCOUNTER — Ambulatory Visit (INDEPENDENT_AMBULATORY_CARE_PROVIDER_SITE_OTHER): Payer: Medicare Other | Admitting: Internal Medicine

## 2022-06-09 ENCOUNTER — Encounter: Payer: Self-pay | Admitting: Internal Medicine

## 2022-06-09 ENCOUNTER — Ambulatory Visit (INDEPENDENT_AMBULATORY_CARE_PROVIDER_SITE_OTHER): Payer: Medicare Other

## 2022-06-09 VITALS — BP 152/78 | HR 85 | Temp 98.2°F | Ht <= 58 in | Wt 143.8 lb

## 2022-06-09 DIAGNOSIS — R0789 Other chest pain: Secondary | ICD-10-CM

## 2022-06-09 DIAGNOSIS — R0609 Other forms of dyspnea: Secondary | ICD-10-CM | POA: Diagnosis not present

## 2022-06-09 DIAGNOSIS — E119 Type 2 diabetes mellitus without complications: Secondary | ICD-10-CM

## 2022-06-09 DIAGNOSIS — K219 Gastro-esophageal reflux disease without esophagitis: Secondary | ICD-10-CM

## 2022-06-09 LAB — CBC WITH DIFFERENTIAL/PLATELET
Basophils Absolute: 0.1 10*3/uL (ref 0.0–0.1)
Basophils Relative: 0.9 % (ref 0.0–3.0)
Eosinophils Absolute: 0.2 10*3/uL (ref 0.0–0.7)
Eosinophils Relative: 2.7 % (ref 0.0–5.0)
HCT: 39.6 % (ref 36.0–46.0)
Hemoglobin: 12.9 g/dL (ref 12.0–15.0)
Lymphocytes Relative: 36 % (ref 12.0–46.0)
Lymphs Abs: 2.2 10*3/uL (ref 0.7–4.0)
MCHC: 32.6 g/dL (ref 30.0–36.0)
MCV: 89.3 fl (ref 78.0–100.0)
Monocytes Absolute: 0.3 10*3/uL (ref 0.1–1.0)
Monocytes Relative: 5.4 % (ref 3.0–12.0)
Neutro Abs: 3.4 10*3/uL (ref 1.4–7.7)
Neutrophils Relative %: 55 % (ref 43.0–77.0)
Platelets: 438 10*3/uL — ABNORMAL HIGH (ref 150.0–400.0)
RBC: 4.43 Mil/uL (ref 3.87–5.11)
RDW: 14.5 % (ref 11.5–15.5)
WBC: 6.1 10*3/uL (ref 4.0–10.5)

## 2022-06-09 LAB — COMPREHENSIVE METABOLIC PANEL
ALT: 19 U/L (ref 0–35)
AST: 16 U/L (ref 0–37)
Albumin: 4.6 g/dL (ref 3.5–5.2)
Alkaline Phosphatase: 105 U/L (ref 39–117)
BUN: 8 mg/dL (ref 6–23)
CO2: 28 mEq/L (ref 19–32)
Calcium: 9.9 mg/dL (ref 8.4–10.5)
Chloride: 102 mEq/L (ref 96–112)
Creatinine, Ser: 0.78 mg/dL (ref 0.40–1.20)
GFR: 78.38 mL/min (ref >60.00)
Glucose, Bld: 110 mg/dL — ABNORMAL HIGH (ref 70–99)
Potassium: 3.8 mEq/L (ref 3.5–5.1)
Sodium: 139 mEq/L (ref 135–145)
Total Bilirubin: 0.4 mg/dL (ref 0.2–1.2)
Total Protein: 8.2 g/dL (ref 6.0–8.3)

## 2022-06-09 LAB — TSH: TSH: 1.93 u[IU]/mL (ref 0.35–5.50)

## 2022-06-09 LAB — HEMOGLOBIN A1C: Hgb A1c MFr Bld: 7.5 % — ABNORMAL HIGH (ref 4.6–6.5)

## 2022-06-09 MED ORDER — PANTOPRAZOLE SODIUM 40 MG PO TBEC: 40.0000 mg | DELAYED_RELEASE_TABLET | Freq: Every day | ORAL | 5 refills | Status: DC

## 2022-06-09 NOTE — Assessment & Plan Note (Addendum)
New Start Protonix GI ref - Dr Havery Moros  CXR Consider ECHO

## 2022-06-09 NOTE — Assessment & Plan Note (Addendum)
New associated w/dysphagia  Start Protonix GI ref - Dr Havery Moros Coronary calcium score of 0  --- 2020 CXR

## 2022-06-09 NOTE — Assessment & Plan Note (Signed)
Coronary calcium score of 0  --- 2020 Cont on Metformin and Prandin. Cont w/wt loss

## 2022-06-09 NOTE — Progress Notes (Signed)
Subjective:  Patient ID: Renee Mann, female    DOB: 01/10/1956  Age: 66 y.o. MRN: [id number]  CC: Follow-up (3 month f/u)   HPI Nohelia A Clendenen presents for CP since Sept 23. She was in [place] 3 h away: she wat at the motel - had CP, burping, belching, had soda - pain resolved. Food started to feel like it would get stuck. C/o fatigue, DOE - new. No CP  Outpatient Medications Prior to Visit  Medication Sig Dispense Refill   ACCU-CHEK GUIDE test strip USE TO CHECK BLOOD SUGARS ONCE A DAY 100 strip 3   Accu-Chek Softclix Lancets lancets USE TO CHECK BLOOD SUGAR DAILY 100 each 3   amLODipine (NORVASC) 5 MG tablet TAKE 1 TABLET BY MOUTH DAILY 90 tablet 1   blood glucose meter kit and supplies Dispense based on patient and insurance preference. Use up to four times daily as directed. (FOR ICD-10 E10.9, E11.9). 1 each 0   Blood Glucose Monitoring Suppl (ACCU-CHEK AVIVA PLUS) w/Device KIT Use to check blood sugar daily. DX E11.9 1 kit 0   Cholecalciferol (VITAMIN D3) 50 MCG (2000 UT) capsule Take 1 capsule (2,000 Units total) by mouth daily. 100 capsule 3   diphenhydrAMINE (BENADRYL) 25 MG tablet Take 25 mg by mouth every 6 (six) hours as needed.     hydrocortisone 2.5 % ointment Apply topically twice daily as need to red sandpapery rash. 30 g 3   IBUPROFEN PO Take by mouth as needed.     meloxicam (MOBIC) 15 MG tablet TAKE 1 TABLET BY MOUTH EVERY DAY AS NEEDED FOR PAIN 30 tablet 1   metFORMIN (GLUCOPHAGE-XR) 750 MG 24 hr tablet TAKE 1 TABLET BY MOUTH ONCE DAILY WITH BREAKFAST 90 tablet 3   repaglinide (PRANDIN) 1 MG tablet TAKE 1 TABLET BY MOUTH 3 TIMES DAILY BEFORE MEAL(S) 270 tablet 1   triamcinolone cream (KENALOG) 0.1 % Apply 1 Application topically 2 (two) times daily as needed. Do not use on face, groin or armpits. 453.6 g 1   zolpidem (AMBIEN) 10 MG tablet Take 0.5-1 tablets (5-10 mg total) by mouth at bedtime as needed for sleep. 30 tablet 3   Semaglutide (RYBELSUS) 3 MG TABS Take 3 mg by  mouth daily. 30 tablet 3   Facility-Administered Medications Prior to Visit  Medication Dose Route Frequency Provider Last Rate Last Admin   omalizumab Arvid Right) injection 300 mg  300 mg Subcutaneous Q28 days Clemon Chambers, MD   300 mg at 04/15/22 1016    ROS: Review of Systems  Constitutional:  Positive for fatigue. Negative for activity change, appetite change, chills and unexpected weight change.  HENT:  Negative for congestion, mouth sores and sinus pressure.   Eyes:  Negative for visual disturbance.  Respiratory:  Positive for shortness of breath. Negative for cough and chest tightness.   Cardiovascular:  Positive for chest pain.  Gastrointestinal:  Negative for abdominal pain and nausea.  Genitourinary:  Negative for difficulty urinating, frequency and vaginal pain.  Musculoskeletal:  Negative for back pain and gait problem.  Skin:  Negative for pallor and rash.  Neurological:  Negative for dizziness, tremors, weakness, numbness and headaches.  Psychiatric/Behavioral:  Negative for confusion, sleep disturbance and suicidal ideas. The patient is not nervous/anxious.     Objective:  BP (!) 152/78 (BP Location: Left Arm)   Pulse 85   Temp 98.2 F (36.8 C) (Oral)   Ht $R'4\' 10"'cp$  (1.473 m)   Wt 143 lb 12.8 oz (  65.2 kg)   SpO2 95%   BMI 30.05 kg/m   BP Readings from Last 3 Encounters:  06/09/22 (!) 152/78  05/27/22 (!) 170/70  05/18/22 (!) 176/82    Wt Readings from Last 3 Encounters:  06/09/22 143 lb 12.8 oz (65.2 kg)  06/08/22 141 lb (64 kg)  05/27/22 144 lb 12.8 oz (65.7 kg)    Physical Exam Constitutional:      General: She is not in acute distress.    Appearance: She is well-developed. She is obese.  HENT:     Head: Normocephalic.     Right Ear: External ear normal.     Left Ear: External ear normal.     Nose: Nose normal.  Eyes:     General:        Right eye: No discharge.        Left eye: No discharge.     Conjunctiva/sclera: Conjunctivae normal.      Pupils: Pupils are equal, round, and reactive to light.  Neck:     Thyroid: No thyromegaly.     Vascular: No JVD.     Trachea: No tracheal deviation.  Cardiovascular:     Rate and Rhythm: Normal rate and regular rhythm.     Heart sounds: Normal heart sounds.  Pulmonary:     Effort: No respiratory distress.     Breath sounds: No stridor. No wheezing.  Abdominal:     General: Bowel sounds are normal. There is no distension.     Palpations: Abdomen is soft. There is no mass.     Tenderness: There is no abdominal tenderness. There is no guarding or rebound.  Musculoskeletal:        General: No tenderness.     Cervical back: Normal range of motion and neck supple. No rigidity.  Lymphadenopathy:     Cervical: No cervical adenopathy.  Skin:    Findings: No erythema or rash.  Neurological:     Mental Status: She is oriented to person, place, and time.     Cranial Nerves: No cranial nerve deficit.     Motor: No abnormal muscle tone.     Coordination: Coordination normal.     Deep Tendon Reflexes: Reflexes normal.  Psychiatric:        Behavior: Behavior normal.        Thought Content: Thought content normal.        Judgment: Judgment normal.     Lab Results  Component Value Date   WBC 9.9 12/25/2021   HGB 13.2 12/25/2021   HCT 40.9 12/25/2021   PLT 428 (H) 12/25/2021   GLUCOSE 116 (H) 03/10/2022   CHOL 193 03/10/2022   TRIG 112.0 03/10/2022   HDL 42.40 03/10/2022   LDLCALC 128 (H) 03/10/2022   ALT 19 03/10/2022   AST 18 03/10/2022   NA 139 03/10/2022   K 4.0 03/10/2022   CL 101 03/10/2022   CREATININE 0.76 03/10/2022   BUN 10 03/10/2022   CO2 29 03/10/2022   TSH 1.58 12/30/2020   HGBA1C 7.5 (H) 03/10/2022   MICROALBUR 1.6 09/16/2020    No results found.  Assessment & Plan:   Problem List Items Addressed This Visit     DOE (dyspnea on exertion)    New Start Protonix GI ref - Dr Havery Moros  CXR Consider ECHO      Relevant Orders   TSH   CBC with  Differential/Platelet   Comprehensive metabolic panel   DG Chest 2 View   ECHOCARDIOGRAM  COMPLETE   Gastroesophageal reflux disease    Worse, associated w/dysphagia Start Protonix GI ref - Dr Havery Moros      Relevant Medications   pantoprazole (PROTONIX) 40 MG tablet   Other Relevant Orders   Ambulatory referral to Gastroenterology   Midsternal chest pain - Primary    New associated w/dysphagia  Start Protonix GI ref - Dr Havery Moros Coronary calcium score of 0  --- 2020 CXR       Relevant Orders   TSH   CBC with Differential/Platelet   Comprehensive metabolic panel   DG Chest 2 View   Ambulatory referral to Gastroenterology   ECHOCARDIOGRAM COMPLETE   Type 2 diabetes mellitus ([place])    Coronary calcium score of 0  --- 2020 Cont on Metformin and Prandin. Cont w/wt loss       Relevant Orders   Comprehensive metabolic panel      Meds ordered this encounter  Medications   pantoprazole (PROTONIX) 40 MG tablet    Sig: Take 1 tablet (40 mg total) by mouth daily.    Dispense:  30 tablet    Refill:  5      Follow-up: No follow-ups on file.  Walker Kehr, MD

## 2022-06-09 NOTE — Assessment & Plan Note (Signed)
Worse, associated w/dysphagia Start Protonix GI ref - Dr Havery Moros

## 2022-06-11 ENCOUNTER — Ambulatory Visit: Payer: Medicare Other

## 2022-06-11 NOTE — Telephone Encounter (Signed)
Prescription drug plan has been added. Patient has signed the form and I have faxed it back to [facility].

## 2022-06-11 NOTE — Telephone Encounter (Signed)
Thank you :)

## 2022-06-11 NOTE — Telephone Encounter (Signed)
Sent PAP application to [place] [facility] [employer] and will be in touch with patient regarding approval

## 2022-06-12 ENCOUNTER — Encounter: Payer: Self-pay | Admitting: Physician Assistant

## 2022-06-12 MED ORDER — DUPILUMAB 300 MG/2ML ~~LOC~~ SOSY
600.0000 mg | PREFILLED_SYRINGE | Freq: Once | SUBCUTANEOUS | Status: AC
  Administered 2022-05-27: 600 mg via SUBCUTANEOUS

## 2022-06-12 NOTE — Addendum Note (Signed)
Addended by: Carin Hock on: 06/12/2022 10:24 AM   Modules accepted: Orders

## 2022-06-17 ENCOUNTER — Ambulatory Visit (HOSPITAL_COMMUNITY)
Admission: RE | Admit: 2022-06-17 | Discharge: 2022-06-17 | Disposition: A | Discharge status: Home / Self Care | Payer: Medicare Other | Source: Ambulatory Visit | Attending: Internal Medicine | Admitting: Internal Medicine

## 2022-06-17 DIAGNOSIS — R0789 Other chest pain: Secondary | ICD-10-CM

## 2022-06-17 DIAGNOSIS — R079 Chest pain, unspecified: Secondary | ICD-10-CM | POA: Diagnosis not present

## 2022-06-17 DIAGNOSIS — I10 Essential (primary) hypertension: Secondary | ICD-10-CM | POA: Insufficient documentation

## 2022-06-17 DIAGNOSIS — R0609 Other forms of dyspnea: Secondary | ICD-10-CM

## 2022-06-17 DIAGNOSIS — E119 Type 2 diabetes mellitus without complications: Secondary | ICD-10-CM | POA: Insufficient documentation

## 2022-06-17 LAB — ECHOCARDIOGRAM COMPLETE
Area-P 1/2: 3.45 cm2
S' Lateral: 2 cm

## 2022-06-17 NOTE — Progress Notes (Signed)
  Echocardiogram 2D Echocardiogram has been performed.  Darlina Sicilian M 06/17/2022, 8:54 AM

## 2022-06-19 DIAGNOSIS — Z23 Encounter for immunization: Secondary | ICD-10-CM | POA: Diagnosis not present

## 2022-07-14 ENCOUNTER — Other Ambulatory Visit: Payer: PRIVATE HEALTH INSURANCE

## 2022-07-14 NOTE — Progress Notes (Signed)
07/20/2022 Renee Mann [id number] 04-11-1956  Referring provider: Cassandria Anger, MD Primary GI doctor: Dr. Havery Moros  ASSESSMENT AND PLAN:   Esophageal dysphagia with history of GERD and nonexertional chest pain Intermittent dysphagia, possible from GERD, esophageal spasm, esophagitis, gastritis.  Treat GERD with PPI twice a day, 30 mins before food.  Can do IBGard/Levsin Will get AB [place] to evaluate gallbladder with mild RUQ pain and intermittent episodes EGD with dilatation    If the EGD is negative and symptoms continue after PPI trial, can consider barium swallow and/or esophageal manometry. I discussed risks of EGD with patient today, including risk of sedation, bleeding or perforation.  Patient provides understanding and gave verbal consent to proceed.  Diverticulosis of colon without hemorrhage Will call if any symptoms. Add on fiber supplement, avoid NSAIDS, information given  Screen colon Due 2027    Patient Care Team: Plotnikov, [name], MD as PCP - General Sydnee Cabal, MD (Orthopedic Surgery) Alden Hipp, MD (Obstetrics and Gynecology) Neldon Mc, Donnamarie Poag, MD as Consulting Physician (Allergy and Immunology)  HISTORY OF PRESENT ILLNESS: 66 y.o. female with a past medical history of HTN, GERD, DM 2, and others listed below presents for evaluation of atypical chest pain with dysphagia.    06/29/2016 colonoscopy Dr. Havery Moros good bowel prep 4 mm benign polyp ascending colon, diverticulosis sigmoid colon, nonbleeding internal hemorrhoids Recall 10 years  06/09/2022 complaining of chest pain burping belching after soda food started to feel it was getting stuck complaining of fatigue DOE. No anemia, liver function normal.  Normal chest x-ray normal thyroid. Started on pantoprazole. Patient was having chest discomfort, 06/17/2022 echocardiogram for chest pain showed ejection fraction 60 to 65%, mild LVH, grade 1 diastolic dysfunction normal  valves.  She states started while she was visiting in [facility], she ate breakfast or lunch, was sitting in hotel watching TV when she had sudden mid sternal chest pain. She started to sip on soda and had burping that helped.  Then yesterday she was sitting at her house about an hour after eating, non exertional chest pain with dizzy/nausea with hot flashes. She got water/soda and as long as she was burping she was feeling better.  Episodes last about 15 mins.   Intermittent solid dysphagia, no issues with liquids.  She denies melena, AB pain.  She reports nausea intermittently, not worse with food or exercise, with one episode of vomiting of clear liquids.  She  denies AB bloating.  No unintentional weight loss, no night sweats. She denies nocturnal symptoms.   She reports NSAID use with mobic as needed and ibuprofen, will take ibuprofen 480m once a day, 1-2 x a day, has not taken mobic in a while.  She denies ETOH use.   She denies family history of gallbladder issues.    She  reports that she quit smoking about 23 years ago. Her smoking use included cigarettes. She has never used smokeless tobacco. She reports that she does not drink alcohol and does not use drugs.  Current Medications:   Current Outpatient Medications (Endocrine & Metabolic):    metFORMIN (GLUCOPHAGE-XR) 750 MG 24 hr tablet, TAKE 1 TABLET BY MOUTH ONCE DAILY WITH BREAKFAST   repaglinide (PRANDIN) 1 MG tablet, TAKE 1 TABLET BY MOUTH 3 TIMES DAILY BEFORE MEAL(S)  Current Outpatient Medications (Cardiovascular):    amLODipine (NORVASC) 5 MG tablet, TAKE 1 TABLET BY MOUTH DAILY  Current Outpatient Medications (Respiratory):    diphenhydrAMINE (BENADRYL) 25 MG tablet, Take 25 mg  by mouth every 6 (six) hours as needed.  Current Outpatient Medications (Analgesics):    IBUPROFEN PO, Take by mouth as needed.   meloxicam (MOBIC) 15 MG tablet, TAKE 1 TABLET BY MOUTH EVERY DAY AS NEEDED FOR PAIN   Current Outpatient Medications  (Other):    ACCU-CHEK GUIDE test strip, USE TO CHECK BLOOD SUGARS ONCE A DAY   Accu-Chek Softclix Lancets lancets, USE TO CHECK BLOOD SUGAR DAILY   blood glucose meter kit and supplies, Dispense based on patient and insurance preference. Use up to four times daily as directed. (FOR ICD-10 E10.9, E11.9).   Blood Glucose Monitoring Suppl (ACCU-CHEK AVIVA PLUS) w/Device KIT, Use to check blood sugar daily. DX E11.9   Cholecalciferol (VITAMIN D3) 50 MCG (2000 UT) capsule, Take 1 capsule (2,000 Units total) by mouth daily.   hydrocortisone 2.5 % ointment, Apply topically twice daily as need to red sandpapery rash.   hyoscyamine (LEVSIN SL) 0.125 MG SL tablet, Place 1 tablet (0.125 mg total) under the tongue every 6 (six) hours as needed for cramping (nausea, AB pain).   pantoprazole (PROTONIX) 40 MG tablet, Take 1 tablet (40 mg total) by mouth daily.   triamcinolone cream (KENALOG) 0.1 %, Apply 1 Application topically 2 (two) times daily as needed. Do not use on face, groin or armpits.   zolpidem (AMBIEN) 10 MG tablet, Take 0.5-1 tablets (5-10 mg total) by mouth at bedtime as needed for sleep.  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 Family History:  Her family history includes Cancer in her mother; Diabetes in her sister; Hypertension in her mother, sister, and another family member; Stroke in her mother.  REVIEW OF SYSTEMS  : All other systems reviewed and negative except where noted in the  History of Present Illness.  PHYSICAL EXAM: BP (!) 150/80   Pulse 78   Ht 4' 10" (1.473 m)   Wt 142 lb 6.4 oz (64.6 kg)   SpO2 98%   BMI 29.76 kg/m  General:   Pleasant, well developed female in no acute distress Head:   Normocephalic and atraumatic. Eyes:  sclerae anicteric,conjunctive pink  Heart:   regular rate and rhythm Pulm:  Clear anteriorly; no wheezing Abdomen:   Soft, Obese AB, Active bowel sounds. mild tenderness in the epigastrium and in the RUQ. Without guarding and Without rebound, No organomegaly appreciated. Rectal: Not evaluated Extremities:  Without edema. Msk: Symmetrical without gross deformities. Peripheral pulses intact.  Neurologic:  Alert and  oriented x4;  No focal deficits.  Skin:   Dry and intact without significant lesions or rashes. Psychiatric:  Cooperative. Normal mood and affect.    Vladimir Crofts, PA-C 8:54 AM

## 2022-07-20 ENCOUNTER — Encounter: Payer: Self-pay | Admitting: Physician Assistant

## 2022-07-20 ENCOUNTER — Ambulatory Visit (INDEPENDENT_AMBULATORY_CARE_PROVIDER_SITE_OTHER): Payer: Medicare Other | Admitting: Physician Assistant

## 2022-07-20 VITALS — BP 150/80 | HR 78 | Ht <= 58 in | Wt 142.4 lb

## 2022-07-20 DIAGNOSIS — K5730 Diverticulosis of large intestine without perforation or abscess without bleeding: Secondary | ICD-10-CM | POA: Diagnosis not present

## 2022-07-20 DIAGNOSIS — R1319 Other dysphagia: Secondary | ICD-10-CM | POA: Diagnosis not present

## 2022-07-20 DIAGNOSIS — R0789 Other chest pain: Secondary | ICD-10-CM

## 2022-07-20 DIAGNOSIS — K219 Gastro-esophageal reflux disease without esophagitis: Secondary | ICD-10-CM

## 2022-07-20 MED ORDER — HYOSCYAMINE SULFATE 0.125 MG SL SUBL: 0.1250 mg | SUBLINGUAL_TABLET | Freq: Four times a day (QID) | SUBLINGUAL | 0 refills | Status: DC | PRN

## 2022-07-20 NOTE — Patient Instructions (Addendum)
_______________________________________________________  If you are age 68 or older, your body mass index should be between 23-30. Your Body mass index is 29.76 kg/m. If this is out of the aforementioned range listed, please consider follow up with your Primary Care Provider.  If you are age 31 or younger, your body mass index should be between 19-25. Your Body mass index is 29.76 kg/m. If this is out of the aformentioned range listed, please consider follow up with your Primary Care Provider.   ________________________________________________________  The [place] [facility] providers would like to encourage you to use [facility] to communicate with providers for non-urgent requests or questions.  Due to long hold times on the telephone, sending your provider a message by [facility] may be a faster and more efficient way to get a response.  Please allow 48 business hours for a response.  Please remember that this is for non-urgent requests.  _______________________________________________________  Dennis Bast will be contacted by [facility] in the next 2 days to arrange a ultrasound.  The number on your caller ID will be [phone], please answer when they call.  If you have not heard from them in 2 days please call [phone] to schedule.    You have been scheduled for an endoscopy. Please follow written instructions given to you at your visit today. If you use inhalers (even only as needed), please bring them with you on the day of your procedure.   Please take your proton pump inhibitor medication, pantoprazole  Please take this medication 30 minutes to 1 hour before meals- this makes it more effective.  Avoid spicy and acidic foods Avoid fatty foods Limit your intake of coffee, tea, alcohol, and carbonated drinks Work to maintain a healthy weight Keep the head of the bed elevated at least 3 inches with blocks or a wedge pillow if you are having any nighttime symptoms Stay upright for  2 hours after eating Avoid meals and snacks three to four hours before bedtime   Diverticulosis Diverticulosis is a condition that develops when small pouches (diverticula) form in the wall of the large intestine (colon). The colon is where water is absorbed and stool (feces) is formed. The pouches form when the inside layer of the colon pushes through weak spots in the outer layers of the colon. You may have a few pouches or many of them. The pouches usually do not cause problems unless they become inflamed or infected. When this happens, the condition is called diverticulitis- this is left lower quadrant pain, diarrhea, fever, chills, nausea or vomiting.  If this occurs please call the office or go to the hospital. Sometimes these patches without inflammation can also have painless bleeding associated with them, if this happens please call the office or go to the hospital. Preventing constipation and increasing fiber can help reduce diverticula and prevent complications. Even if you feel you have a high-fiber diet, suggest getting on Benefiber or Cirtracel 2 times daily.

## 2022-07-20 NOTE — Progress Notes (Signed)
Agree with assessment and plan as outlined.  

## 2022-07-21 ENCOUNTER — Ambulatory Visit (INDEPENDENT_AMBULATORY_CARE_PROVIDER_SITE_OTHER): Payer: Medicare Other | Admitting: Internal Medicine

## 2022-07-21 ENCOUNTER — Encounter: Payer: Self-pay | Admitting: Internal Medicine

## 2022-07-21 VITALS — BP 128/80 | HR 76 | Temp 97.7°F | Ht <= 58 in | Wt 141.0 lb

## 2022-07-21 DIAGNOSIS — G4700 Insomnia, unspecified: Secondary | ICD-10-CM

## 2022-07-21 DIAGNOSIS — R0609 Other forms of dyspnea: Secondary | ICD-10-CM | POA: Diagnosis not present

## 2022-07-21 DIAGNOSIS — E119 Type 2 diabetes mellitus without complications: Secondary | ICD-10-CM | POA: Diagnosis not present

## 2022-07-21 DIAGNOSIS — M1990 Unspecified osteoarthritis, unspecified site: Secondary | ICD-10-CM | POA: Diagnosis not present

## 2022-07-21 DIAGNOSIS — R0789 Other chest pain: Secondary | ICD-10-CM | POA: Diagnosis not present

## 2022-07-21 DIAGNOSIS — I10 Essential (primary) hypertension: Secondary | ICD-10-CM | POA: Diagnosis not present

## 2022-07-21 MED ORDER — ZOLPIDEM TARTRATE 10 MG PO TABS: 5.0000 mg | ORAL_TABLET | Freq: Every evening | ORAL | 3 refills | Status: DC | PRN

## 2022-07-21 MED ORDER — ACCU-CHEK AVIVA PLUS W/DEVICE KIT: PACK | 0 refills | Status: DC

## 2022-07-21 NOTE — Progress Notes (Signed)
Subjective:  Patient ID: Renee Mann, female    DOB: [birth year]  Age: 66 y.o. MRN: [id number]  CC: Follow-up (Knots on hand and foot )   HPI Renee Mann presents for CP, GERD, HTN Protonix - not sure if it has helped  Outpatient Medications Prior to Visit  Medication Sig Dispense Refill   ACCU-CHEK GUIDE test strip USE TO CHECK BLOOD SUGARS ONCE A DAY 100 strip 3   Accu-Chek Softclix Lancets lancets USE TO CHECK BLOOD SUGAR DAILY 100 each 3   amLODipine (NORVASC) 5 MG tablet TAKE 1 TABLET BY MOUTH DAILY 90 tablet 1   blood glucose meter kit and supplies Dispense based on patient and insurance preference. Use up to four times daily as directed. (FOR ICD-10 E10.9, E11.9). 1 each 0   Cholecalciferol (VITAMIN D3) 50 MCG (2000 UT) capsule Take 1 capsule (2,000 Units total) by mouth daily. 100 capsule 3   diphenhydrAMINE (BENADRYL) 25 MG tablet Take 25 mg by mouth every 6 (six) hours as needed.     hydrocortisone 2.5 % ointment Apply topically twice daily as need to red sandpapery rash. 30 g 3   hyoscyamine (LEVSIN SL) 0.125 MG SL tablet Place 1 tablet (0.125 mg total) under the tongue every 6 (six) hours as needed for cramping (nausea, AB pain). 10 tablet 0   IBUPROFEN PO Take by mouth as needed.     meloxicam (MOBIC) 15 MG tablet TAKE 1 TABLET BY MOUTH EVERY DAY AS NEEDED FOR PAIN 30 tablet 1   metFORMIN (GLUCOPHAGE-XR) 750 MG 24 hr tablet TAKE 1 TABLET BY MOUTH ONCE DAILY WITH BREAKFAST 90 tablet 3   pantoprazole (PROTONIX) 40 MG tablet Take 1 tablet (40 mg total) by mouth daily. 30 tablet 5   repaglinide (PRANDIN) 1 MG tablet TAKE 1 TABLET BY MOUTH 3 TIMES DAILY BEFORE MEAL(S) 270 tablet 1   triamcinolone cream (KENALOG) 0.1 % Apply 1 Application topically 2 (two) times daily as needed. Do not use on face, groin or armpits. 453.6 g 1   Blood Glucose Monitoring Suppl (ACCU-CHEK AVIVA PLUS) w/Device KIT Use to check blood sugar daily. DX E11.9 1 kit 0   zolpidem (AMBIEN) 10 MG tablet Take  0.5-1 tablets (5-10 mg total) by mouth at bedtime as needed for sleep. 30 tablet 3   No facility-administered medications prior to visit.    ROS: Review of Systems  Constitutional:  Negative for activity change, appetite change, chills, fatigue and unexpected weight change.  HENT:  Negative for congestion, mouth sores and sinus pressure.   Eyes:  Negative for visual disturbance.  Respiratory:  Negative for cough and chest tightness.   Cardiovascular:  Positive for chest pain.  Gastrointestinal:  Negative for abdominal pain and nausea.  Genitourinary:  Negative for difficulty urinating, frequency and vaginal pain.  Musculoskeletal:  Negative for back pain and gait problem.  Skin:  Negative for pallor and rash.  Neurological:  Negative for dizziness, tremors, weakness, numbness and headaches.  Psychiatric/Behavioral:  Negative for confusion and sleep disturbance.     Objective:  BP 128/80 (BP Location: Right Arm, Patient Position: Sitting, Cuff Size: Normal)   Pulse 76   Temp 97.7 F (36.5 C) (Oral)   Ht _0  (1.473 m)   Wt 141 lb (64 kg)   SpO2 100%   BMI 29.47 kg/m   BP Readings from Last 3 Encounters:  07/21/22 128/80  07/20/22 (!) 150/80  06/09/22 (!) 152/78    Wt Readings from Last  3 Encounters:  07/21/22 141 lb (64 kg)  07/20/22 142 lb 6.4 oz (64.6 kg)  06/09/22 143 lb 12.8 oz (65.2 kg)    Physical Exam Constitutional:      General: She is not in acute distress.    Appearance: She is well-developed. She is obese.  HENT:     Head: Normocephalic.     Right Ear: External ear normal.     Left Ear: External ear normal.     Nose: Nose normal.  Eyes:     General:        Right eye: No discharge.        Left eye: No discharge.     Conjunctiva/sclera: Conjunctivae normal.     Pupils: Pupils are equal, round, and reactive to light.  Neck:     Thyroid: No thyromegaly.     Vascular: No JVD.     Trachea: No tracheal deviation.  Cardiovascular:     Rate and  Rhythm: Normal rate and regular rhythm.     Heart sounds: Normal heart sounds.  Pulmonary:     Effort: No respiratory distress.     Breath sounds: No stridor. No wheezing.  Abdominal:     General: Bowel sounds are normal. There is no distension.     Palpations: Abdomen is soft. There is no mass.     Tenderness: There is no abdominal tenderness. There is no guarding or rebound.  Musculoskeletal:        General: No tenderness.     Cervical back: Normal range of motion and neck supple. No rigidity.  Lymphadenopathy:     Cervical: No cervical adenopathy.  Skin:    Findings: No erythema or rash.  Neurological:     Cranial Nerves: No cranial nerve deficit.     Motor: No abnormal muscle tone.     Coordination: Coordination normal.     Deep Tendon Reflexes: Reflexes normal.  Psychiatric:        Behavior: Behavior normal.        Thought Content: Thought content normal.        Judgment: Judgment normal.     Lab Results  Component Value Date   WBC 6.1 06/09/2022   HGB 12.9 06/09/2022   HCT 39.6 06/09/2022   PLT 438.0 (H) 06/09/2022   GLUCOSE 110 (H) 06/09/2022   CHOL 193 03/10/2022   TRIG 112.0 03/10/2022   HDL 42.40 03/10/2022   LDLCALC 128 (H) 03/10/2022   ALT 19 06/09/2022   AST 16 06/09/2022   NA 139 06/09/2022   K 3.8 06/09/2022   CL 102 06/09/2022   CREATININE 0.78 06/09/2022   BUN 8 06/09/2022   CO2 28 06/09/2022   TSH 1.93 06/09/2022   HGBA1C 7.5 (H) 06/09/2022   MICROALBUR 1.6 09/16/2020    ECHOCARDIOGRAM COMPLETE  Result Date: 06/17/2022    ECHOCARDIOGRAM REPORT   Patient Name:   Renee Mann Date of Exam: 06/17/2022 Medical Rec #:  [id number]    Height:       58.0 in Accession #:    [id number]   Weight:       143.8 lb Date of Birth:  August 26, 1955   BSA:          1.583 m Patient Age:    41 years     BP:           152/78 mmHg Patient Gender: F            HR:  86 bpm. Exam Location:  Outpatient Procedure: 2D Echo, 3D Echo, Cardiac Doppler, Color Doppler and  Strain Analysis Indications:    Chest Pain R07.9  History:        Patient has no prior history of Echocardiogram examinations.                 Risk Factors:Hypertension and Diabetes.  Sonographer:    Darlina Sicilian RDCS Referring Phys: [place]  Sonographer Comments: Global longitudinal strain was attempted. IMPRESSIONS  1. Left ventricular ejection fraction, by estimation, is 60 to 65%. Left ventricular ejection fraction by 3D volume is 60 %. The left ventricle has normal function. The left ventricle has no regional wall motion abnormalities. There is mild concentric left ventricular hypertrophy. Left ventricular diastolic parameters are consistent with Grade I diastolic dysfunction (impaired relaxation). The average left ventricular global longitudinal strain is -19.0 %. The global longitudinal strain is normal.  2. Right ventricular systolic function is normal. The right ventricular size is normal. Tricuspid regurgitation signal is inadequate for assessing PA pressure.  3. The mitral valve is normal in structure. No evidence of mitral valve regurgitation. No evidence of mitral stenosis.  4. The aortic valve is tricuspid. There is mild thickening of the aortic valve. Aortic valve regurgitation is not visualized. Aortic valve sclerosis is present, with no evidence of aortic valve stenosis.  5. The inferior vena cava is normal in size with greater than 50% respiratory variability, suggesting right atrial pressure of 3 mmHg. Comparison(s): No prior Echocardiogram. FINDINGS  Left Ventricle: Left ventricular ejection fraction, by estimation, is 60 to 65%. Left ventricular ejection fraction by 3D volume is 60 %. The left ventricle has normal function. The left ventricle has no regional wall motion abnormalities. The average left ventricular global longitudinal strain is -19.0 %. The global longitudinal strain is normal. The left ventricular internal cavity size was normal in size. There is mild  concentric left ventricular hypertrophy. Left ventricular diastolic parameters are consistent with Grade I diastolic dysfunction (impaired relaxation). Right Ventricle: The right ventricular size is normal. No increase in right ventricular wall thickness. Right ventricular systolic function is normal. Tricuspid regurgitation signal is inadequate for assessing PA pressure. Left Atrium: Left atrial size was normal in size. Right Atrium: Right atrial size was normal in size. Pericardium: There is no evidence of pericardial effusion. Mitral Valve: The mitral valve is normal in structure. No evidence of mitral valve regurgitation. No evidence of mitral valve stenosis. Tricuspid Valve: The tricuspid valve is normal in structure. Tricuspid valve regurgitation is not demonstrated. Aortic Valve: The aortic valve is tricuspid. There is mild thickening of the aortic valve. Aortic valve regurgitation is not visualized. Aortic valve sclerosis is present, with no evidence of aortic valve stenosis. Pulmonic Valve: The pulmonic valve was normal in structure. Pulmonic valve regurgitation is trivial. Aorta: The aortic root and ascending aorta are structurally normal, with no evidence of dilitation. Venous: The inferior vena cava is normal in size with greater than 50% respiratory variability, suggesting right atrial pressure of 3 mmHg. IAS/Shunts: The atrial septum is grossly normal.  LEFT VENTRICLE PLAX 2D LVIDd:         3.00 cm         Diastology LVIDs:         2.00 cm         LV e' medial:    5.87 cm/s LV PW:         0.90 cm  LV E/e' medial:  13.1 LV IVS:        1.10 cm         LV e' lateral:   6.64 cm/s LVOT diam:     1.90 cm         LV E/e' lateral: 11.6 LV SV:         52 LV SV Index:   33              2D LVOT Area:     2.84 cm        Longitudinal                                Strain                                2D Strain GLS  -19.0 %                                Avg:                                 3D Volume EF                                 LV 3D EF:    Left                                             ventricul                                             ar                                             ejection                                             fraction                                             by 3D                                             volume is                                             60 %.  3D Volume EF:                                3D EF:        60 %                                LV EDV:       80 ml                                LV ESV:       32 ml                                LV SV:        48 ml RIGHT VENTRICLE RV S prime:     11.10 cm/s TAPSE (M-mode): 1.4 cm LEFT ATRIUM             Index        RIGHT ATRIUM          Index LA diam:        3.20 cm 2.02 cm/m   RA Area:     7.92 cm LA Vol (A2C):   36.4 ml 23.00 ml/m  RA Volume:   14.00 ml 8.84 ml/m LA Vol (A4C):   36.2 ml 22.87 ml/m LA Biplane Vol: 38.1 ml 24.07 ml/m  AORTIC VALVE LVOT Vmax:   86.50 cm/s LVOT Vmean:  65.500 cm/s LVOT VTI:    0.185 m  AORTA Ao Root diam: 2.80 cm Ao Asc diam:  3.00 cm MITRAL VALVE MV Area (PHT): 3.45 cm    SHUNTS MV Decel Time: 220 msec    Systemic VTI:  0.18 m MV E velocity: 77.10 cm/s  Systemic Diam: 1.90 cm MV A velocity: 90.80 cm/s MV E/A ratio:  0.85 Gwyndolyn Kaufman MD Electronically signed by Gwyndolyn Kaufman MD Signature Date/Time: 06/17/2022/11:05:13 AM    Final     Assessment & Plan:   Problem List Items Addressed This Visit     Benign essential hypertension    Cont on Norvasc 5 mg/d Rybelsus - wt loss should help, but too $$$      DOE (dyspnea on exertion) - Primary    Protonix - not sure if it has helped EGD tomorrow      INSOMNIA, PERSISTENT    On Zolpidem prn  Potential benefits of a long term opioids use as well as potential risks (i.e. addiction risk, apnea etc) and complications (i.e. Somnolence, constipation and others) were explained to  the patient and were aknowledged.      Midsternal chest pain    Protonix - not sure if it has helped EGD tomorrow Abd [place] pending      Osteoarthritis    Spenco arch supports      Type 2 diabetes mellitus ([place])     Rybelsus - wt loss should help, but too $$$         Meds ordered this encounter  Medications   zolpidem (AMBIEN) 10 MG tablet    Sig: Take 0.5-1 tablets (5-10 mg total) by mouth at bedtime as needed for sleep.    Dispense:  30 tablet    Refill:  3   Blood Glucose Monitoring Suppl (ACCU-CHEK AVIVA  PLUS) w/Device KIT    Sig: Use to check blood sugar daily. DX E11.9    Dispense:  1 kit    Refill:  0      Follow-up: No follow-ups on file.  Walker Kehr, MD

## 2022-07-21 NOTE — Assessment & Plan Note (Signed)
Protonix - not sure if it has helped EGD tomorrow Abd [place] pending

## 2022-07-21 NOTE — Assessment & Plan Note (Signed)
  Rybelsus - wt loss should help, but too $$$

## 2022-07-21 NOTE — Assessment & Plan Note (Signed)
Cont on Norvasc 5 mg/d Rybelsus - wt loss should help, but too $$$

## 2022-07-21 NOTE — Patient Instructions (Signed)
Spenco arch supports

## 2022-07-21 NOTE — Assessment & Plan Note (Signed)
Protonix - not sure if it has helped EGD tomorrow

## 2022-07-21 NOTE — Assessment & Plan Note (Signed)
Spenco arch supports

## 2022-07-21 NOTE — Assessment & Plan Note (Signed)
On Zolpidem prn  Potential benefits of a long term opioids use as well as potential risks (i.e. addiction risk, apnea etc) and complications (i.e. Somnolence, constipation and others) were explained to the patient and were aknowledged.

## 2022-07-22 ENCOUNTER — Encounter: Payer: Self-pay | Admitting: Gastroenterology

## 2022-07-22 ENCOUNTER — Ambulatory Visit (AMBULATORY_SURGERY_CENTER): Payer: Medicare Other | Admitting: Gastroenterology

## 2022-07-22 VITALS — BP 142/91 | HR 78 | Temp 97.8°F | Resp 16 | Ht <= 58 in | Wt 142.0 lb

## 2022-07-22 DIAGNOSIS — R1319 Other dysphagia: Secondary | ICD-10-CM

## 2022-07-22 DIAGNOSIS — K2950 Unspecified chronic gastritis without bleeding: Secondary | ICD-10-CM | POA: Diagnosis not present

## 2022-07-22 DIAGNOSIS — B9681 Helicobacter pylori [H. pylori] as the cause of diseases classified elsewhere: Secondary | ICD-10-CM | POA: Diagnosis not present

## 2022-07-22 DIAGNOSIS — R1310 Dysphagia, unspecified: Secondary | ICD-10-CM | POA: Diagnosis not present

## 2022-07-22 DIAGNOSIS — K219 Gastro-esophageal reflux disease without esophagitis: Secondary | ICD-10-CM

## 2022-07-22 DIAGNOSIS — E119 Type 2 diabetes mellitus without complications: Secondary | ICD-10-CM | POA: Diagnosis not present

## 2022-07-22 DIAGNOSIS — K229 Disease of esophagus, unspecified: Secondary | ICD-10-CM | POA: Diagnosis not present

## 2022-07-22 DIAGNOSIS — I10 Essential (primary) hypertension: Secondary | ICD-10-CM | POA: Diagnosis not present

## 2022-07-22 MED ORDER — SODIUM CHLORIDE 0.9 % IV SOLN: 500.0000 mL | Freq: Once | INTRAVENOUS | Status: DC

## 2022-07-22 NOTE — Patient Instructions (Signed)
Please read handouts provided. Continue present medications. Resume previous diet. Await pathology results.  YOU HAD AN ENDOSCOPIC PROCEDURE TODAY AT [facility]:   Refer to the procedure report that was given to you for any specific questions about what was found during the examination.  If the procedure report does not answer your questions, please call your gastroenterologist to clarify.  If you requested that your care partner not be given the details of your procedure findings, then the procedure report has been included in a sealed envelope for you to review at your convenience later.  YOU SHOULD EXPECT: Some feelings of bloating in the abdomen. Passage of more gas than usual.  Walking can help get rid of the air that was put into your GI tract during the procedure and reduce the bloating. If you had a lower endoscopy (such as a colonoscopy or flexible sigmoidoscopy) you may notice spotting of blood in your stool or on the toilet paper. If you underwent a bowel prep for your procedure, you may not have a normal bowel movement for a few days.  Please Note:  You might notice some irritation and congestion in your nose or some drainage.  This is from the oxygen used during your procedure.  There is no need for concern and it should clear up in a day or so.  SYMPTOMS TO REPORT IMMEDIATELY:   Following upper endoscopy (EGD)  Vomiting of blood or coffee ground material  New chest pain or pain under the shoulder blades  Painful or persistently difficult swallowing  New shortness of breath  Fever of 100F or higher  Black, tarry-looking stools  For urgent or emergent issues, a gastroenterologist can be reached at any hour by calling [phone]. Do not use MyChart messaging for urgent concerns.    DIET:  We do recommend a small meal at first, but then you may proceed to your regular diet.  Drink plenty of fluids but you should avoid alcoholic beverages for 24  hours.  ACTIVITY:  You should plan to take it easy for the rest of today and you should NOT DRIVE or use heavy machinery until tomorrow (because of the sedation medicines used during the test).    FOLLOW UP: Our staff will call the number listed on your records the next business day following your procedure.  We will call around 7:15- 8:00 am to check on you and address any questions or concerns that you may have regarding the information given to you following your procedure. If we do not reach you, we will leave a message.     If any biopsies were taken you will be contacted by phone or by letter within the next 1-3 weeks.  Please call us at [phone] if you have not heard about the biopsies in 3 weeks.    SIGNATURES/CONFIDENTIALITY: You and/or your care partner have signed paperwork which will be entered into your electronic medical record.  These signatures attest to the fact that that the information above on your After Visit Summary has been reviewed and is understood.  Full responsibility of the confidentiality of this discharge information lies with you and/or your care-partner.

## 2022-07-22 NOTE — Progress Notes (Signed)
Report given to PACU, vss 

## 2022-07-22 NOTE — Progress Notes (Signed)
1408 Robinul 0.1 mg IV given due large amount of secretions upon assessment.  MD made aware, vss

## 2022-07-22 NOTE — Progress Notes (Signed)
History and Physical Interval Note: Seen 07/20/22 - no interval changes. EGD to evaluate intermittent dysphagia and suspected GERD - intermittent nonexertional discomfort in chest relieved with belching, associated with eating. Now on protonix '40mg'$  BID although states she has not started it yet. Echo in October looked okay. Cardiac CT in 2020 with calcium score of 0. Have discussed EGD and anesthesia with her, she wishes to proceed, all questions answered.   07/22/2022 1:57 PM  Renee Mann  has presented today for endoscopic procedure(s), with the diagnosis of  Encounter Diagnoses  Name Primary?   Esophageal dysphagia Yes   Gastroesophageal reflux disease, unspecified whether esophagitis present   .  The various methods of evaluation and treatment have been discussed with the patient and/or family. After consideration of risks, benefits and other options for treatment, the patient has consented to  the endoscopic procedure(s).   The patient's history has been reviewed, patient examined, no change in status, stable for surgery.  I have reviewed the patient's chart and labs.  Questions were answered to the patient's satisfaction.    Jolly Mango, MD [facility]

## 2022-07-22 NOTE — Op Note (Signed)
[facility] Patient Name: Renee Mann Procedure Date: 07/22/2022 1:59 PM MRN: [id number] Endoscopist: Viviann Spare P. Adela Lank , MD, [id number] Age: 66 Referring MD:  Date of Birth: [birth year] Gender: Female Account #: [id number] Procedure:                Upper GI endoscopy Indications:              Dysphagia, Suspected gastro-esophageal reflux                            disease, atypical chest pain (negative cardiac                            evaluation - related to eating, relieved with                            belching), recently started on protonix (has not                            yet taken it) Medicines:                Monitored Anesthesia Care Procedure:                Pre-Anesthesia Assessment:                           - Prior to the procedure, a History and Physical                            was performed, and patient medications and                            allergies were reviewed. The patient's tolerance of                            previous anesthesia was also reviewed. The risks                            and benefits of the procedure and the sedation                            options and risks were discussed with the patient.                            All questions were answered, and informed consent                            was obtained. Prior Anticoagulants: The patient has                            taken no anticoagulant or antiplatelet agents. ASA                            Grade Assessment: II - A patient with mild systemic  disease. After reviewing the risks and benefits,                            the patient was deemed in satisfactory condition to                            undergo the procedure.                           After obtaining informed consent, the endoscope was                            passed under direct vision. Throughout the                            procedure, the patient's blood pressure, pulse,  and                            oxygen saturations were monitored continuously. The                            GIF [id number] #[id number] was introduced through the                            mouth, and advanced to the second part of duodenum.                            The upper GI endoscopy was accomplished without                            difficulty. The patient tolerated the procedure                            well. Scope In: Scope Out: Findings:                 Esophagogastric landmarks were identified: the                            Z-line was found at 36 cm, the gastroesophageal                            junction was found at 36 cm and the upper extent of                            the gastric folds was found at 36 cm from the                            incisors.                           The exam of the esophagus was otherwise normal. No                            obvious stenosis /  stricture, or inflammatory                            changes, although it appeared somewhat spastic.                           A guidewire was placed and the scope was withdrawn.                            Empiric dilation was performed in the entire                            esophagus with a Savary dilator with mild                            resistance at 17 mm. Relook endoscopy showed no                            mucosal wrents. Biopsies were obtained from the                            proximal and distal esophagus with cold forceps to                            rule out eosinophilic esophagitis.                           A single subepithelial nodule was found in the                            gastric antrum, a few cm in size. May be a lipoma,                            however was somewhat harder to palpation than                            expected for a lipoma. Bite on bite biopsies were                            taken with a cold forceps for histology.                           Diffuse  atrophic mucosa was found in the entire                            examined stomach. Biopsies were taken with a cold                            forceps for Helicobacter pylori testing / rule out                            GIM.  The exam of the stomach was otherwise normal.                           The examined duodenum was normal. Complications:            No immediate complications. Estimated blood loss:                            Minimal. Estimated Blood Loss:     Estimated blood loss was minimal. Impression:               - Esophagogastric landmarks identified.                           - Normal esophagus otherwise, perhaps somewhat                            spastic                           - Empiric dilation of the esophagus performed to                            17mm, biopsies taken.                           - A single subepithelial nodule found in the                            stomach. Biopsied.                           - Gastric mucosal atrophy. Biopsied.                           - Normal examined duodenum. Recommendation:           - Patient has a contact number available for                            emergencies. The signs and symptoms of potential                            delayed complications were discussed with the                            patient. Return to normal activities tomorrow.                            Written discharge instructions were provided to the                            patient.                           - Resume previous diet.                           -  Continue present medications.                           - Await pathology results.                           - Await course post dilation and trial of protonix                            as previously recommended. If symptoms persist                            despite this would evaluate for underlying motility                            disorder with barium study /  manometry Tangela Dolliver P. Janace Decker, MD 07/22/2022 2:31:10 PM This report has been signed electronically.

## 2022-07-22 NOTE — Progress Notes (Signed)
Called to room to assist during endoscopic procedure.  Patient ID and intended procedure confirmed with present staff. Received instructions for my participation in the procedure from the performing physician.  

## 2022-07-22 NOTE — Progress Notes (Signed)
1413 HR > 100 with esmolol 25 mg given IV, MD updated, vss

## 2022-07-23 ENCOUNTER — Telehealth: Payer: Self-pay

## 2022-07-23 NOTE — Telephone Encounter (Signed)
  Follow up Call-     07/22/2022    1:11 PM  Call back number  Post procedure Call Back phone  # [phone]  Permission to leave phone message Yes     Patient questions:  Do you have a fever, pain , or abdominal swelling? No. Pain Score  0 *  Have you tolerated food without any problems? Yes.    Have you been able to return to your normal activities? Yes.    Do you have any questions about your discharge instructions: Diet   No. Medications  No. Follow up visit  No.  Do you have questions or concerns about your Care? No.  Actions: * If pain score is 4 or above: No action needed, pain <4.

## 2022-07-24 ENCOUNTER — Other Ambulatory Visit: Payer: Self-pay | Admitting: Internal Medicine

## 2022-07-30 ENCOUNTER — Ambulatory Visit (HOSPITAL_COMMUNITY)
Admission: RE | Admit: 2022-07-30 | Discharge: 2022-07-30 | Disposition: A | Discharge status: Home / Self Care | Payer: Medicare Other | Source: Ambulatory Visit | Attending: Physician Assistant | Admitting: Physician Assistant

## 2022-07-30 DIAGNOSIS — R0789 Other chest pain: Secondary | ICD-10-CM | POA: Diagnosis not present

## 2022-07-30 DIAGNOSIS — K760 Fatty (change of) liver, not elsewhere classified: Secondary | ICD-10-CM | POA: Diagnosis not present

## 2022-07-30 DIAGNOSIS — K219 Gastro-esophageal reflux disease without esophagitis: Secondary | ICD-10-CM | POA: Insufficient documentation

## 2022-07-30 DIAGNOSIS — K8020 Calculus of gallbladder without cholecystitis without obstruction: Secondary | ICD-10-CM | POA: Diagnosis not present

## 2022-07-31 ENCOUNTER — Ambulatory Visit (INDEPENDENT_AMBULATORY_CARE_PROVIDER_SITE_OTHER): Payer: Medicare Other | Admitting: *Deleted

## 2022-07-31 ENCOUNTER — Telehealth: Payer: Self-pay

## 2022-07-31 ENCOUNTER — Ambulatory Visit: Payer: PRIVATE HEALTH INSURANCE

## 2022-07-31 DIAGNOSIS — L281 Prurigo nodularis: Secondary | ICD-10-CM

## 2022-07-31 DIAGNOSIS — Z1231 Encounter for screening mammogram for malignant neoplasm of breast: Secondary | ICD-10-CM | POA: Diagnosis not present

## 2022-07-31 MED ORDER — DUPILUMAB 300 MG/2ML ~~LOC~~ SOSY
300.0000 mg | PREFILLED_SYRINGE | SUBCUTANEOUS | Status: AC
  Administered 2022-07-31 – 2024-09-13 (×53): 300 mg via SUBCUTANEOUS

## 2022-07-31 NOTE — Telephone Encounter (Signed)
Refer to [place] result note 07/30/22.

## 2022-07-31 NOTE — Telephone Encounter (Signed)
Patient returning your call, will be available around 2pm

## 2022-08-04 ENCOUNTER — Other Ambulatory Visit: Payer: Self-pay | Admitting: Internal Medicine

## 2022-08-05 ENCOUNTER — Other Ambulatory Visit: Payer: Self-pay

## 2022-08-05 MED ORDER — METRONIDAZOLE 500 MG PO TABS: 500.0000 mg | ORAL_TABLET | Freq: Two times a day (BID) | ORAL | 0 refills | Status: AC

## 2022-08-05 MED ORDER — AMOXICILLIN 500 MG PO TABS: 1000.0000 mg | ORAL_TABLET | Freq: Two times a day (BID) | ORAL | 0 refills | Status: AC

## 2022-08-05 MED ORDER — CLARITHROMYCIN 500 MG PO TABS: 500.0000 mg | ORAL_TABLET | Freq: Two times a day (BID) | ORAL | 0 refills | Status: AC

## 2022-08-14 ENCOUNTER — Ambulatory Visit (INDEPENDENT_AMBULATORY_CARE_PROVIDER_SITE_OTHER): Payer: Medicare Other | Admitting: *Deleted

## 2022-08-14 DIAGNOSIS — L281 Prurigo nodularis: Secondary | ICD-10-CM | POA: Diagnosis not present

## 2022-08-19 ENCOUNTER — Other Ambulatory Visit: Payer: Self-pay | Admitting: Physician Assistant

## 2022-08-19 DIAGNOSIS — R0789 Other chest pain: Secondary | ICD-10-CM

## 2022-08-24 ENCOUNTER — Other Ambulatory Visit: Payer: Self-pay | Admitting: Internal Medicine

## 2022-08-28 ENCOUNTER — Ambulatory Visit (INDEPENDENT_AMBULATORY_CARE_PROVIDER_SITE_OTHER): Payer: Medicare Other

## 2022-08-28 DIAGNOSIS — L281 Prurigo nodularis: Secondary | ICD-10-CM | POA: Diagnosis not present

## 2022-09-01 NOTE — Progress Notes (Unsigned)
FOLLOW UP Date of Service/Encounter:  09/02/22   Subjective:  Renee Mann (DOB: [birth year]) is a 67 y.o. female who returns to the [facility] on 09/02/2022 in re-evaluation of the following: rash History obtained from: chart review and patient.  For Review, LV was on 05/27/22  with Dr.Rayhaan Huster seen for routine follow-up. We stopped Xolair and started Dupixent.   Pertinent history/diagnostics: Patient previously on Xolair but stopped in 2021 due to an insurance issue.  Hives were controlled at that time so she continued on antihistamines and topical steroids.  However in March 2023, rash returned and she wanted to restart Xolair which was started in April 2023.  Exam normal, but Xolair restarted based on patient history.  In October 2023, after 5 Xolair injections, she reports no improvement in rash.  Rash on exam showed hyperpigmented macules scattered on back and abdomen with excoriations, concern for atopic dermatitis with prurigo nodularis. Started on [place].  Today presents for follow-up. Her hives and rash on her trunk have improved. She is no longer having severe generalized itching attacks since starting dupixent.   She is still having rash under her breast and in groin.  She is using creams and ointments (topical steorids, anti-itch ointments) to help with itching, but rash does not resolve.  She is no longer breaking out into an itch all over where she can't stop scratching. Her blood sugars have been in the moderate high range, but never above > 200. She is on metformin, not insulin dependent.   Allergies as of 09/02/2022       Reactions   Codeine Hives   Atenolol Other (See Comments)   Weird feelings   Hydrochlorothiazide W-triamterene Other (See Comments)   headache        Medication List        Accurate as of September 02, 2022  9:19 AM. If you have any questions, ask your nurse or doctor.          Accu-Chek Aviva Plus w/Device Kit Use to check  blood sugar daily. DX E11.9   Accu-Chek Guide test strip Generic drug: glucose blood USE TO CHECK BLOOD SUGARS ONCE A DAY   Accu-Chek Softclix Lancets lancets USE TO CHECK BLOOD SUGAR DAILY   amLODipine 5 MG tablet Commonly known as: NORVASC TAKE 1 TABLET BY MOUTH EVERY DAY   blood glucose meter kit and supplies Dispense based on patient and insurance preference. Use up to four times daily as directed. (FOR ICD-10 E10.9, E11.9).   Clotrimazole 1 % Lotn Apply 1 Application topically in the morning and at bedtime. Use until rash disappears and then another 3 days. Started by: Clemon Chambers, MD   diphenhydrAMINE 25 MG tablet Commonly known as: BENADRYL Take 25 mg by mouth every 6 (six) hours as needed.   hydrocortisone 2.5 % ointment Apply topically twice daily as need to red sandpapery rash.   hyoscyamine 0.125 MG SL tablet Commonly known as: LEVSIN SL Place 1 tablet (0.125 mg total) under the tongue every 6 (six) hours as needed for cramping (nausea, AB pain).   IBUPROFEN PO Take by mouth as needed.   meloxicam 15 MG tablet Commonly known as: MOBIC TAKE 1 TABLET BY MOUTH EVERY DAY AS NEEDED FOR PAIN   metFORMIN 750 MG 24 hr tablet Commonly known as: GLUCOPHAGE-XR TAKE 1 TABLET BY MOUTH ONCE DAILY WITH BREAKFAST   pantoprazole 40 MG tablet Commonly known as: PROTONIX Take 1 tablet (40 mg total) by mouth  daily.   repaglinide 1 MG tablet Commonly known as: PRANDIN TAKE 1 TABLET BY MOUTH 3 TIMES DAILY BEFORE MEAL(S)   triamcinolone cream 0.1 % Commonly known as: KENALOG APPLY 1 APPLICATION TOPICALLY 2 (TWO) TIMES DAILY AS NEEDED. DO NOT USE ON FACE, GROIN OR ARMPITS.   Vitamin D3 50 MCG (2000 UT) capsule Take 1 capsule (2,000 Units total) by mouth daily.   zolpidem 10 MG tablet Commonly known as: AMBIEN Take 0.5-1 tablets (5-10 mg total) by mouth at bedtime as needed for sleep.       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  ROS: All others negative except as noted per HPI.   Objective:  BP (!) 142/80   Pulse (!) 102   Temp 98.2 F (36.8 C) (Temporal)   Resp 20   Wt 143 lb 3.2 oz (65 kg)   SpO2 97%   BMI 29.93 kg/m  Body mass index is 29.93 kg/m. Physical Exam: General Appearance:  Alert, cooperative, no distress, appears stated age  Head:  Normocephalic, without obvious abnormality, atraumatic  Eyes:  Conjunctiva clear, EOM's intact  Nose: Nares normal, hypertrophic turbinates, normal mucosa, and no visible anterior polyps  Throat: Lips, tongue normal; teeth and gums normal, normal posterior oropharynx  Neck: Supple, symmetrical  Lungs:   clear to auscultation bilaterally, Respirations unlabored, no coughing  Heart:  regular rate and rhythm and no murmur, Appears well perfused  Extremities: No edema  Skin: Skin color, texture, turgor normal, red beefy rash under bilateral breasts, some hyperpigmented nodular areas scattered on trunk  Neurologic: No gross deficits   Assessment/Plan  Current rash under breasts consistent with candidiasis with hx of diabetes likely contributing, close follow-up to ensure resolving. Low threshold to treat systemically if no improvement with topicals.  Dupixent helping with itching from nodular rashes on trunk (suspect prurigo nodularis).  Hives also resolved. Will continue for now.   RASH-improved on Dupixent  1. Every day use zyrtec (cetirizine) 10 mg twice daily (max 40 mg/day = 2 tablets twice daily) as needed. Can use this in place of benadryl to avoid sleepiness.   2.  Triamcinolone ointment on body up to twice daily in areas of rash. Do not use on face, groin or arm pits. Will send in hydrocortisone 2.5% ointment for these areas.   3. Nasal saline spray as needed  4 Continue Dupixent per protocol. Call to schedule delivery using number provided.  5. Start clotrimazole lotion on rash under breast and groin-use twice daily until rash resolves and continue for another 3 days  Follow-up in 4 weeks, sooner if needed. It was a pleasure seeing you in clinic again today.  Sigurd Sos, MD  [facility]

## 2022-09-02 ENCOUNTER — Ambulatory Visit (INDEPENDENT_AMBULATORY_CARE_PROVIDER_SITE_OTHER): Payer: Medicare Other | Admitting: Internal Medicine

## 2022-09-02 ENCOUNTER — Other Ambulatory Visit: Payer: Self-pay

## 2022-09-02 ENCOUNTER — Encounter: Payer: Self-pay | Admitting: Internal Medicine

## 2022-09-02 VITALS — BP 142/80 | HR 102 | Temp 98.2°F | Resp 20 | Wt 143.2 lb

## 2022-09-02 DIAGNOSIS — L509 Urticaria, unspecified: Secondary | ICD-10-CM

## 2022-09-02 DIAGNOSIS — L281 Prurigo nodularis: Secondary | ICD-10-CM

## 2022-09-02 DIAGNOSIS — B3789 Other sites of candidiasis: Secondary | ICD-10-CM

## 2022-09-02 DIAGNOSIS — R21 Rash and other nonspecific skin eruption: Secondary | ICD-10-CM | POA: Diagnosis not present

## 2022-09-02 MED ORDER — CLOTRIMAZOLE 1 % EX LOTN: 1.0000 | TOPICAL_LOTION | Freq: Two times a day (BID) | CUTANEOUS | 3 refills | Status: DC

## 2022-09-02 NOTE — Patient Instructions (Addendum)
RASH-  1. Every day use zyrtec (cetirizine) 10 mg twice daily (max 40 mg/day = 2 tablets twice daily) as needed. Can use this in place of benadryl to avoid sleepiness.   2. Triamcinolone ointment on body up to twice daily in areas of rash. Do not use on face, groin or arm pits. Will send in hydrocortisone 2.5% ointment for these areas.   3. Nasal saline spray as needed  4 Continue Dupixent per protocol. Call to schedule delivery using number provided.  5. Start clotrimazole lotion on rash under breast and groin-use twice daily until rash resolves and continue for another 3 days  Follow-up in 4 weeks, sooner if needed. It was a pleasure seeing you in clinic again today.

## 2022-09-03 ENCOUNTER — Ambulatory Visit
Admission: RE | Admit: 2022-09-03 | Discharge: 2022-09-03 | Disposition: A | Discharge status: Home / Self Care | Payer: Medicare Other | Source: Ambulatory Visit | Attending: Internal Medicine | Admitting: Internal Medicine

## 2022-09-03 DIAGNOSIS — Z780 Asymptomatic menopausal state: Secondary | ICD-10-CM

## 2022-09-03 DIAGNOSIS — Z1231 Encounter for screening mammogram for malignant neoplasm of breast: Secondary | ICD-10-CM

## 2022-09-07 ENCOUNTER — Telehealth: Payer: Self-pay

## 2022-09-07 MED ORDER — KETOCONAZOLE 2 % EX CREA: 1.0000 | TOPICAL_CREAM | Freq: Every day | CUTANEOUS | 0 refills | Status: AC | PRN

## 2022-09-07 NOTE — Telephone Encounter (Signed)
Sent in ketoconazole cream - once per day use.

## 2022-09-07 NOTE — Telephone Encounter (Signed)
Patient called in - DOB/Pharmacy verified - stated insurance will not cover lotion and pharmacy advised the lotion is on backorder.  Patient is requesting alternative be sent into pharmacy.  Patient advised message would be sent to provider - once she responds our office will contact and she can check for updates via her myChart.  Patient verbalized understanding, no further questions.

## 2022-09-07 NOTE — Addendum Note (Signed)
Addended by: Garnet Sierras on: 09/07/2022 12:27 PM   Modules accepted: Orders

## 2022-09-11 ENCOUNTER — Ambulatory Visit (INDEPENDENT_AMBULATORY_CARE_PROVIDER_SITE_OTHER): Payer: Medicare Other

## 2022-09-11 DIAGNOSIS — L281 Prurigo nodularis: Secondary | ICD-10-CM | POA: Diagnosis not present

## 2022-09-15 ENCOUNTER — Other Ambulatory Visit: Payer: Self-pay | Admitting: Internal Medicine

## 2022-09-16 ENCOUNTER — Telehealth: Payer: Self-pay

## 2022-09-16 DIAGNOSIS — A048 Other specified bacterial intestinal infections: Secondary | ICD-10-CM

## 2022-09-16 NOTE — Telephone Encounter (Signed)
-----  Message from Yevette Edwards, RN sent at 08/05/2022 10:52 AM EST ----- Regarding: [employer] H. Pylori H. Pylori [employer] stool swab due - need to order

## 2022-09-20 ENCOUNTER — Other Ambulatory Visit: Payer: Self-pay | Admitting: Internal Medicine

## 2022-09-23 NOTE — Telephone Encounter (Signed)
Spoke with patient to remind her that she is due for H. Pylori [employer] stool swab labs at this time. No appointment is necessary. Patient is aware that she can stop by the 2nd floor receptionist desk within the next week to pick up stool kit and instructions. Pt knows that once she collects the sample she will return it directly to the lab in the basement. Patient verbalized understanding and had no concerns at the end of the call.   [employer] H. Pylori order completed, demographics, and insurance information printed. Instructions printed and placed in stool kit.

## 2022-09-29 NOTE — Progress Notes (Unsigned)
FOLLOW UP Date of Service/Encounter:  09/30/22   Subjective:  Renee Mann (DOB: 07/18/1956) is a 67 y.o. female who returns to the [facility] on 09/30/2022 in re-evaluation of the following: rash History obtained from: chart review and patient.  For Review, LV was on 09/02/22  with Dr.Rosibel Giacobbe seen for routine follow-up. See patient summary below. We did start lotrimin for suspected candidiasis of groin and breast. Plan to consider systemic therapy if no improvement.   Pertinent history/diagnostics:  Patient previously on Xolair but stopped in 2021 due to an insurance issue.  Hives were controlled at that time so she continued on antihistamines and topical steroids.  However in March 2023, rash returned and she wanted to restart Xolair which was started in April 2023.  Exam normal, but Xolair restarted based on patient history.  In October 2023, after 5 Xolair injections, she reports no improvement in rash.  Rash on exam showed hyperpigmented macules scattered on back and abdomen with excoriations, concern for atopic dermatitis with prurigo nodularis. Started on [place].  At her Jan 2024 follow-up, rash on trunk improved and generalized pruritus significantly improved.  However, still reporting rash on breast and groin.  She reported mod-high glycemia, but never reaching > 200 when checked (non-insulin dependent diabetic). Exam concerning for candidiasis treated with lotrimin, and we discussed oral treatment if fails topical.   Today presents for follow-up. She reports that her under breast and thigh rash has significantly improved since starting lotrimin. It is no longer red or itchy.   She is trying to go braless as often as possible at home and wearing loose fitting clothes. Her other rash and itchiness is controlled on dupixent. She is not as itchy as she was previously, and feels much more comfortable. She has been approved for the rest of 2024.  Allergies as of 09/30/2022        Reactions   Codeine Hives   Atenolol Other (See Comments)   Weird feelings   Hydrochlorothiazide W-triamterene Other (See Comments)   headache        Medication List        Accurate as of September 30, 2022  8:54 AM. If you have any questions, ask your nurse or doctor.          Accu-Chek Aviva Plus w/Device Kit Use to check blood sugar daily. DX E11.9   Accu-Chek Guide test strip Generic drug: glucose blood USE TO CHECK BLOOD SUGARS ONCE A DAY   Accu-Chek Softclix Lancets lancets USE TO CHECK BLOOD SUGAR DAILY   amLODipine 5 MG tablet Commonly known as: NORVASC TAKE 1 TABLET BY MOUTH EVERY DAY   blood glucose meter kit and supplies Dispense based on patient and insurance preference. Use up to four times daily as directed. (FOR ICD-10 E10.9, E11.9).   clotrimazole 1 % cream Commonly known as: LOTRIMIN APPLY 1 APPLICATION TOPICALLY IN THE MORNING AND AT BEDTIME. USE UNTIL RASH DISAPPEARS AND THEN ANOTHER 3 DAYS.   diphenhydrAMINE 25 MG tablet Commonly known as: BENADRYL Take 25 mg by mouth every 6 (six) hours as needed.   hydrocortisone 2.5 % ointment Apply topically twice daily as need to red sandpapery rash.   hyoscyamine 0.125 MG SL tablet Commonly known as: LEVSIN SL Place 1 tablet (0.125 mg total) under the tongue every 6 (six) hours as needed for cramping (nausea, AB pain).   IBUPROFEN PO Take by mouth as needed.   ketoconazole 2 % cream Commonly known as: NIZORAL  Apply 1 Application topically daily as needed (rash).   meloxicam 15 MG tablet Commonly known as: MOBIC TAKE 1 TABLET BY MOUTH EVERY DAY AS NEEDED FOR PAIN   metFORMIN 750 MG 24 hr tablet Commonly known as: GLUCOPHAGE-XR TAKE 1 TABLET BY MOUTH ONCE DAILY WITH BREAKFAST   pantoprazole 40 MG tablet Commonly known as: PROTONIX Take 1 tablet (40 mg total) by mouth daily.   repaglinide 1 MG tablet Commonly known as: PRANDIN TAKE 1 TABLET BY MOUTH 3 TIMES DAILY BEFORE MEAL(S)    triamcinolone cream 0.1 % Commonly known as: KENALOG APPLY 1 APPLICATION TOPICALLY 2 (TWO) TIMES DAILY AS NEEDED. DO NOT USE ON FACE, GROIN OR ARMPITS.   Vitamin D3 50 MCG (2000 UT) capsule Take 1 capsule (2,000 Units total) by mouth daily.   zolpidem 10 MG tablet Commonly known as: AMBIEN Take 0.5-1 tablets (5-10 mg total) by mouth at bedtime as needed for sleep.       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  ROS: All others negative except as noted per HPI.   Objective:  BP (!) 146/82   Pulse 82   Temp 98.9 F (37.2 C) (Temporal)   Wt 142 lb 12.8 oz (64.8 kg)   SpO2 98%   BMI 29.85 kg/m  Body mass index is 29.85 kg/m. Physical Exam: General Appearance:  Alert, cooperative, no distress, appears stated age  Head:  Normocephalic, without obvious abnormality, atraumatic  Eyes:  Conjunctiva clear, EOM's intact  Nose: Nares normal, normal mucosa  Neck: Supple, symmetrical  Lungs:   clear to auscultation bilaterally, Respirations unlabored, no coughing  Heart:  regular rate and rhythm and no murmur, Appears well perfused  Extremities: No edema  Skin: Hyperpigmented macular rash under breast with hyperpigmented satellite lesions, much improved from prior exam  Neurologic: No gross deficits   Assessment/Plan  Candidal rash under breast and groin now improved on lotrim ointment. AD and PN also controlled on dupixent. No changes. Due for dupixent injection today  RASH-  1. Every day use zyrtec (cetirizine) 10 mg twice daily (max 40 mg/day = 2 tablets twice  daily) as needed. Can use this in place of benadryl to avoid sleepiness.   2. Triamcinolone ointment on body up to twice daily in areas of rash. Do not use on face, groin or arm pits. Will send in hydrocortisone 2.5% ointment for these areas.   3. Nasal saline spray as needed  4 Continue Dupixent per protocol. Call to schedule delivery using number provided.  5. Rash under breasts and thighs-continue clotrimazole lotion-use twice daily until rash resolves and continue for another 3 days  Follow-up in 12 months, sooner if needed. It was a pleasure seeing you in clinic again today.  Sigurd Sos, MD  [facility]

## 2022-09-30 ENCOUNTER — Encounter: Payer: Self-pay | Admitting: Internal Medicine

## 2022-09-30 ENCOUNTER — Ambulatory Visit (INDEPENDENT_AMBULATORY_CARE_PROVIDER_SITE_OTHER): Payer: Medicare Other | Admitting: Internal Medicine

## 2022-09-30 ENCOUNTER — Other Ambulatory Visit: Payer: Self-pay

## 2022-09-30 ENCOUNTER — Ambulatory Visit: Payer: Medicare Other

## 2022-09-30 VITALS — BP 146/82 | HR 82 | Temp 98.9°F | Wt 142.8 lb

## 2022-09-30 DIAGNOSIS — L281 Prurigo nodularis: Secondary | ICD-10-CM | POA: Diagnosis not present

## 2022-09-30 DIAGNOSIS — B3789 Other sites of candidiasis: Secondary | ICD-10-CM

## 2022-09-30 DIAGNOSIS — L509 Urticaria, unspecified: Secondary | ICD-10-CM | POA: Diagnosis not present

## 2022-09-30 NOTE — Patient Instructions (Signed)
RASH-  1. Every day use zyrtec (cetirizine) 10 mg twice daily (max 40 mg/day = 2 tablets twice daily) as needed. Can use this in place of benadryl to avoid sleepiness.   2. Triamcinolone ointment on body up to twice daily in areas of rash. Do not use on face, groin or arm pits. Will send in hydrocortisone 2.5% ointment for these areas.   3. Nasal saline spray as needed  4 Continue Dupixent per protocol. Call to schedule delivery using number provided.  5. Rash under breasts and thighs-continue clotrimazole lotion-use twice daily until rash resolves and continue for another 3 days  Follow-up in 12 months, sooner if needed. It was a pleasure seeing you in clinic again today.

## 2022-10-01 ENCOUNTER — Telehealth: Payer: Self-pay | Admitting: Gastroenterology

## 2022-10-01 NOTE — Telephone Encounter (Signed)
Rectal swab done 09/25/22 for H pylori is negative - H pylori eradicated   She will see me in the office for follow up appointment this month

## 2022-10-03 ENCOUNTER — Other Ambulatory Visit: Payer: Self-pay | Admitting: Internal Medicine

## 2022-10-07 ENCOUNTER — Ambulatory Visit (INDEPENDENT_AMBULATORY_CARE_PROVIDER_SITE_OTHER): Payer: Medicare Other | Admitting: Gastroenterology

## 2022-10-07 ENCOUNTER — Encounter: Payer: Self-pay | Admitting: Gastroenterology

## 2022-10-07 VITALS — BP 132/80 | HR 84 | Ht <= 58 in | Wt 143.4 lb

## 2022-10-07 DIAGNOSIS — R0789 Other chest pain: Secondary | ICD-10-CM

## 2022-10-07 DIAGNOSIS — K8020 Calculus of gallbladder without cholecystitis without obstruction: Secondary | ICD-10-CM | POA: Diagnosis not present

## 2022-10-07 DIAGNOSIS — K760 Fatty (change of) liver, not elsewhere classified: Secondary | ICD-10-CM

## 2022-10-07 DIAGNOSIS — K319 Disease of stomach and duodenum, unspecified: Secondary | ICD-10-CM | POA: Diagnosis not present

## 2022-10-07 DIAGNOSIS — R1310 Dysphagia, unspecified: Secondary | ICD-10-CM

## 2022-10-07 DIAGNOSIS — K219 Gastro-esophageal reflux disease without esophagitis: Secondary | ICD-10-CM | POA: Diagnosis not present

## 2022-10-07 DIAGNOSIS — Z8619 Personal history of other infectious and parasitic diseases: Secondary | ICD-10-CM | POA: Diagnosis not present

## 2022-10-07 NOTE — Progress Notes (Signed)
HPI :  67 year old female with a history of GERD, dysphagia, atypical chest pain, gallstones, fatty liver, here to reassess some of these issues.  She was most recently seen in November 2023.  Recall she was having problems with reflux, and dysphagia.  Treated her reflux symptoms with Protonix 40 mg which has been taking once to twice daily.  I performed an EGD in November, she had no obvious stenosis or stricture noted.  No obvious inflammatory changes concerning for EOE, biopsies were negative for that.  No hiatal hernia.  Her esophagus did appear a bit tortuous and spastic.  I performed empiric dilation of her esophagus to 17 mm.  No mucosal rents were noted.  Incidentally she had some atrophic gastric mucosa noted, biopsies were taken and showed no intestinal metaplasia but she did have H. pylori gastritis.  This was treated with amoxicillin, azithromycin, Flagyl, and PPI.  She had a follow-up H. pylori rectal swab/Diatherix done which was negative and confirmed eradication.  Further, she had an antral subepithelial nodule noted.  Bite on bite biopsies were taken and negative for clear cause, it appeared rather hard to palpation for typical lipoma.  She states that the dilation did not really help her symptoms at all.  She continues to have dysphagia periodically to solids, not much to liquids.  This occurs with most meals and is bothersome to her.  She feels in her throat area as well as her mid esophagus.  She also has some chest discomfort, sometimes related to eating, sometimes not.  She has no exertional chest pain or shortness of breath.  She had an echo done recently which looked okay and a cardiac CT scan which had a calcium score 0.  She does not think this is cardiac in nature.  She was given some Levsin for this and it actually works pretty well to control her symptoms when she takes it.  She has been taking her Protonix 40 mg twice daily, seems to work okay currently to control her  reflux.  Of note as part of her workup previously with the PA she had an ultrasound of her abdomen done in December.  This showed some gallstones and hepatic steatosis.  She has occasional mild discomfort in her upper abdomen but it is seldom and not frequently, she denies any recurrent significant right upper quadrant pain.  No nausea or vomiting.  She does not drink any alcohol.  She has had stable weight around the 140s, her BMI is around 30.  She does drink coffee routinely, she is working on weight loss.    06/29/2016 colonoscopy Dr. Havery Moros good bowel prep 4 mm benign polyp ascending colon, diverticulosis sigmoid colon, nonbleeding internal hemorrhoids Recall 10 years   06/17/2022 echocardiogram for chest pain showed ejection fraction 60 to 65%, mild LVH, grade 1 diastolic dysfunction normal valves.      EGD 07/22/22: Esophagogastric landmarks were identified: the                            Z-line was found at 36 cm, the gastroesophageal                            junction was found at 36 cm and the upper extent of  the gastric folds was found at 36 cm from the                            incisors.                           The exam of the esophagus was otherwise normal. No                            obvious stenosis / stricture, or inflammatory                            changes, although it appeared somewhat spastic.                           A guidewire was placed and the scope was withdrawn.                            Empiric dilation was performed in the entire                            esophagus with a Savary dilator with mild                            resistance at 17 mm. Relook endoscopy showed no                            mucosal wrents. Biopsies were obtained from the                            proximal and distal esophagus with cold forceps to                            rule out eosinophilic esophagitis.                           A single  subepithelial nodule was found in the                            gastric antrum, a few cm in size. May be a lipoma,                            however was somewhat harder to palpation than                            expected for a lipoma. Bite on bite biopsies were                            taken with a cold forceps for histology.                           Diffuse atrophic mucosa was found in the entire  examined stomach. Biopsies were taken with a cold                            forceps for Helicobacter pylori testing / rule out                            GIM.                           The exam of the stomach was otherwise normal.                           The examined duodenum was normal.  1. Surgical [P], gastric nodule bx - ANTRAL MUCOSA WITH CHRONIC GASTRITIS - NO HELICOBACTER ORGANISMS IDENTIFIED ON H&E STAIN - SEE NOTE 2. Surgical [P], gastric antrum and gastric body - ANTRAL AND OXYNTIC MUCOSAE WITH CHRONIC, FOCALLY ACTIVE HELICOBACTER ASSOCIATED GASTRITIS - OXYNTIC MUCOSA WITH MILD, FOCAL PROTON PUMP INHIBITOR TYPE EFFECT - IMMUNOHISTOCHEMICAL STAIN FOR HELICOBACTER ORGANISMS IS POSITIVE - NEGATIVE FOR INTESTINAL METAPLASIA OR DYSPLASIA 3. Surgical [P], esophagus bx - SQUAMOUS MUCOSA WITH MILD REACTIVE CHANGES SUGGESTIVE OF REFLUX - NEGATIVE FOR EOSINOPHILS  1. Multiple deeper levels are examined and there is no evidence of a neoplasm in this biopsy. There is no submucosa present to evaluate for the presence of a lipoma. The biopsy is negative for intestinal metaplasia, dysplasia or malignancy. Clinical correlation recommended.  US abdomen 07/30/22: IMPRESSION: 1. Hepatic fatty infiltration. 2. Cholelithiasis. 3. Echogenic kidneys consistent with chronic medical renal disease.  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   URI (upper respiratory infection) 11/28/2009     Past Surgical History:  Procedure Laterality Date   ABDOMINAL HYSTERECTOMY  1987   burritis surgery Left 1998   hip   Family History  Problem Relation Age of Onset   Hypertension Mother    Cancer Mother    Stroke Mother    Hypertension Sister    Diabetes Sister    Hypertension Other    Colon cancer Neg Hx    Esophageal cancer Neg Hx    Stomach cancer Neg Hx    Rectal cancer Neg Hx    Social History   Tobacco Use   Smoking status: Former    Types: Cigarettes    Quit date: 08/24/1998    Years since quitting: 24.1   Smokeless tobacco: Never  Vaping Use   Vaping Use: Never used  Substance Use Topics   Alcohol use: No    Alcohol/week: 0.0 standard drinks of alcohol   Drug use: No   Current Outpatient Medications  Medication Sig Dispense Refill   ACCU-CHEK GUIDE test strip USE TO CHECK BLOOD SUGARS ONCE A DAY 100 strip 3   Accu-Chek Softclix Lancets lancets USE TO CHECK BLOOD SUGAR DAILY 100 each 3   amLODipine (NORVASC) 5 MG tablet TAKE 1 TABLET BY MOUTH EVERY DAY 90 tablet 3   blood glucose meter kit and supplies Dispense based on patient and insurance preference. Use up to four times daily as directed. (FOR ICD-10 E10.9, E11.9). 1 each 0  Blood Glucose Monitoring Suppl (ACCU-CHEK AVIVA PLUS) w/Device KIT Use to check blood sugar daily. DX E11.9 1 kit 0   Cholecalciferol (VITAMIN D3) 50 MCG (2000 UT) capsule Take 1 capsule (2,000 Units total) by mouth daily. 100 capsule 3   clotrimazole (LOTRIMIN) 1 % cream APPLY 1 APPLICATION TOPICALLY IN THE MORNING AND AT BEDTIME. USE UNTIL RASH DISAPPEARS AND THEN ANOTHER 3 DAYS. 30 g 3   diphenhydrAMINE (BENADRYL) 25 MG tablet Take 25 mg by mouth every 6 (six) hours as needed.     hydrocortisone 2.5 % ointment Apply topically twice daily as need to red sandpapery rash. 30 g 3   hyoscyamine (LEVSIN SL) 0.125 MG SL tablet Place 1  tablet (0.125 mg total) under the tongue every 6 (six) hours as needed for cramping (nausea, AB pain). 30 tablet 0   IBUPROFEN PO Take by mouth as needed.     ketoconazole (NIZORAL) 2 % cream Apply 1 Application topically daily as needed (rash). 30 g 0   meloxicam (MOBIC) 15 MG tablet TAKE 1 TABLET BY MOUTH EVERY DAY AS NEEDED FOR PAIN 30 tablet 1   metFORMIN (GLUCOPHAGE-XR) 750 MG 24 hr tablet TAKE 1 TABLET BY MOUTH ONCE DAILY WITH BREAKFAST 90 tablet 3   pantoprazole (PROTONIX) 40 MG tablet Take 1 tablet (40 mg total) by mouth daily. 30 tablet 5   repaglinide (PRANDIN) 1 MG tablet TAKE 1 TABLET BY MOUTH 3 TIMES DAILY BEFORE MEAL(S) 270 tablet 1   triamcinolone cream (KENALOG) 0.1 % APPLY TO AFFECTED AREA TWICE A DAY 30 g 5   zolpidem (AMBIEN) 10 MG tablet Take 0.5-1 tablets (5-10 mg total) by mouth at bedtime as needed for sleep. 30 tablet 3   Current Facility-Administered Medications  Medication Dose Route Frequency Provider Last Rate Last Admin   dupilumab (DUPIXENT) prefilled syringe 300 mg  300 mg Subcutaneous Q14 Days Clemon Chambers, MD   300 mg at 09/30/22 [id number]   Allergies  Allergen Reactions   Codeine Hives   Atenolol Other (See Comments)    Weird feelings   Hydrochlorothiazide W-Triamterene Other (See Comments)    headache     Review of Systems: All systems reviewed and negative except where noted in HPI.   Lab Results  Component Value Date   WBC 6.1 06/09/2022   HGB 12.9 06/09/2022   HCT 39.6 06/09/2022   MCV 89.3 06/09/2022   PLT 438.0 (H) 06/09/2022    Lab Results  Component Value Date   CREATININE 0.78 06/09/2022   BUN 8 06/09/2022   NA 139 06/09/2022   K 3.8 06/09/2022   CL 102 06/09/2022   CO2 28 06/09/2022    Lab Results  Component Value Date   ALT 19 06/09/2022   AST 16 06/09/2022   ALKPHOS 105 06/09/2022   BILITOT 0.4 06/09/2022     Physical Exam: BP 132/80   Pulse 84   Ht 4' 10"$  (1.473 m)   Wt 143 lb 6.4 oz (65 kg)   SpO2 96%   BMI  29.97 kg/m  Constitutional: Pleasant,well-developed, female in no acute distress. Neurological: Alert and oriented to person place and time. Psychiatric: Normal mood and affect. Behavior is normal.   ASSESSMENT: 67 y.o. female here for assessment of the following  1. Dysphagia, unspecified type   2. Gastroesophageal reflux disease, unspecified whether esophagitis present   3. Atypical chest pain   4. Subepithelial gastric lesion   5. History of Helicobacter pylori infection   6. Gallstones  7. Fatty liver    Intermittent dysphagia, EGD negative other than showing a spastic/tortuous esophagus.  Empiric dilation did not help.  No evidence of EOE.  I suspect she likely has dysmotility or spasm causing her symptoms.  We discussed ways to evaluate this.  Initially will send her for a barium swallow with tablet to see if we can help localize this and get a sense of overall motility.  Discussed esophageal manometry, she may need this although is not too interested in that now based on discussion today.  We will await barium study initially, make sure no obvious evidence of achalasia or anything else concerning.  I do think she may have a component of spasm, Levsin seems to be working okay, can add peppermint deltoids as needed to treat empirically for esophageal spasm in the interim.  She can also continue her Protonix.  Incidentally had a subepithelial gastric nodule, but bite on bite biopsies negative.  This could be a lipoma although it felt rather hard to palpation for typical lipoma on endoscopy.  Discussed differential diagnosis with her, will discuss this with Dr. Rush Landmark to see if he will consider EUS.  She willing to proceed with this if offered.  Was H. pylori positive, treated and eradicated.  I reviewed gallstones with her, she really does not have any significant biliary colic.  She will be mindful of symptoms and if this occurs she will contact me although wants to avoid surgical  evaluation right now.  Discussed risks of complications from gallstones and she understands.  Otherwise counseled her on fatty liver, she will work on weight loss, routine coffee intake recommended, continue to abstain from alcohol, check LFTs yearly.  PLAN: - barium swallow with tablet for dysphagia - add peppermint althoids - 2 under the tongue PRN for esophageal spasm - continue Levsin PRN - continue protonix for now - H pylori eradicated - will discuss her case with Dr. Rush Landmark about possible EUS - offered surgical evaluation for gallstones, no significant biliary colic, she declines surgical opinion for now, will be mindful of symptoms - counseled on fatty liver - LFTs this fall, work on weight loss, coffee intake recommended  Jolly Mango, MD [facility]

## 2022-10-07 NOTE — Patient Instructions (Addendum)
If your blood pressure at your visit was 140/90 or greater, please contact your primary care physician to follow up on this.  _______________________________________________________  If you are age 69 or older, your body mass index should be between 23-30. Your Body mass index is 29.97 kg/m. If this is out of the aforementioned range listed, please consider follow up with your Primary Care Provider.  If you are age 36 or younger, your body mass index should be between 19-25. Your Body mass index is 29.97 kg/m. If this is out of the aformentioned range listed, please consider follow up with your Primary Care Provider.   ________________________________________________________  The [facility] providers would like to encourage you to use [facility] to communicate with providers for non-urgent requests or questions.  Due to long hold times on the telephone, sending your provider a message by [facility] may be a faster and more efficient way to get a response.  Please allow 48 business hours for a response.  Please remember that this is for non-urgent requests.  _______________________________________________________ Dennis Bast have been scheduled for a Barium Esophogram at [facility] (1st floor of the hospital) on Monday, 10-19-22 at 9:00am. Please arrive 30 minutes prior to your appointment for registration. Make certain not to have anything to eat or drink 3 hours prior to your test. If you need to reschedule for any reason, please contact radiology at [phone] to do so. __________________________________________________________________ A barium swallow is an examination that concentrates on views of the esophagus. This tends to be a double contrast exam (barium and two liquids which, when combined, create a gas to distend the wall of the oesophagus) or single contrast (non-ionic iodine based). The study is usually tailored to your symptoms so a good history is essential. Attention is paid during the  study to the form, structure and configuration of the esophagus, looking for functional disorders (such as aspiration, dysphagia, achalasia, motility and reflux) EXAMINATION You may be asked to change into a gown, depending on the type of swallow being performed. A radiologist and radiographer will perform the procedure. The radiologist will advise you of the type of contrast selected for your procedure and direct you during the exam. You will be asked to stand, sit or lie in several different positions and to hold a small amount of fluid in your mouth before being asked to swallow while the imaging is performed .In some instances you may be asked to swallow barium coated marshmallows to assess the motility of a solid food bolus. The exam can be recorded as a digital or video fluoroscopy procedure. POST PROCEDURE It will take 1-2 days for the barium to pass through your system. To facilitate this, it is important, unless otherwise directed, to increase your fluids for the next 24-48hrs and to resume your normal diet.  This test typically takes about 30 minutes to perform. _________________________________________________________________________________  Please use 2 (two) Peppermint Altoids under your tongue for esophageal spasm.  Thank you for entrusting me with your care and for choosing [facility], Dr. Wasco Cellar

## 2022-10-10 ENCOUNTER — Telehealth: Payer: Self-pay | Admitting: Gastroenterology

## 2022-10-10 DIAGNOSIS — K319 Disease of stomach and duodenum, unspecified: Secondary | ICD-10-CM

## 2022-10-10 NOTE — Telephone Encounter (Signed)
Discussed case with Dr. Rush Landmark about possible EUS. There is a chance this lesion in the stomach may be a lipoma, we discussed doing a CT scan first to evaluate this and if an obvious lipoma there, she would not need an EUS. If CT scan does not show the lesion as an obvious lipoma, would then proceed with EUS.  Renee Mann can you let the patient know I discussed her case with GM. Recommending CT scan abdomen to rule out gastric lipoma as next step. Pending this result, will determine if we wish to pursue EUS or not. Can you help order her the CT scan if she is willing? Thanks

## 2022-10-12 NOTE — Telephone Encounter (Signed)
Called and spoke with patient regarding Dr. Doyne Keel recommendations. Pt would like to proceed with CT scan as recommended. Pt knows to expect a call from radiology scheduling to set up her CT appt. Pt has been advised to keep her upcoming appt for her barium esophagram. Pt verbalized understanding and had no concerns at the end of the call.  CT order in epic. Secure staff message sent to radiology scheduling to contact patient to set up appt.

## 2022-10-12 NOTE — Addendum Note (Signed)
Addended by: Yevette Edwards on: 10/12/2022 10:48 AM   Modules accepted: Orders

## 2022-10-14 ENCOUNTER — Ambulatory Visit (INDEPENDENT_AMBULATORY_CARE_PROVIDER_SITE_OTHER): Payer: Medicare Other

## 2022-10-14 DIAGNOSIS — L209 Atopic dermatitis, unspecified: Secondary | ICD-10-CM

## 2022-10-19 ENCOUNTER — Ambulatory Visit (HOSPITAL_COMMUNITY)
Admission: RE | Admit: 2022-10-19 | Discharge: 2022-10-19 | Disposition: A | Discharge status: Home / Self Care | Payer: Medicare Other | Source: Ambulatory Visit | Attending: Gastroenterology | Admitting: Gastroenterology

## 2022-10-19 DIAGNOSIS — R0789 Other chest pain: Secondary | ICD-10-CM | POA: Diagnosis not present

## 2022-10-19 DIAGNOSIS — K319 Disease of stomach and duodenum, unspecified: Secondary | ICD-10-CM | POA: Diagnosis not present

## 2022-10-19 DIAGNOSIS — R1310 Dysphagia, unspecified: Secondary | ICD-10-CM | POA: Diagnosis not present

## 2022-10-19 DIAGNOSIS — K760 Fatty (change of) liver, not elsewhere classified: Secondary | ICD-10-CM

## 2022-10-19 DIAGNOSIS — K219 Gastro-esophageal reflux disease without esophagitis: Secondary | ICD-10-CM | POA: Diagnosis not present

## 2022-10-19 DIAGNOSIS — K8020 Calculus of gallbladder without cholecystitis without obstruction: Secondary | ICD-10-CM | POA: Diagnosis not present

## 2022-10-19 DIAGNOSIS — Z8619 Personal history of other infectious and parasitic diseases: Secondary | ICD-10-CM

## 2022-10-20 ENCOUNTER — Encounter: Payer: Self-pay | Admitting: Internal Medicine

## 2022-10-20 ENCOUNTER — Ambulatory Visit (INDEPENDENT_AMBULATORY_CARE_PROVIDER_SITE_OTHER): Payer: Medicare Other | Admitting: Internal Medicine

## 2022-10-20 VITALS — BP 138/86 | HR 82 | Temp 97.6°F | Ht <= 58 in | Wt 143.0 lb

## 2022-10-20 DIAGNOSIS — E119 Type 2 diabetes mellitus without complications: Secondary | ICD-10-CM

## 2022-10-20 DIAGNOSIS — R1310 Dysphagia, unspecified: Secondary | ICD-10-CM | POA: Insufficient documentation

## 2022-10-20 DIAGNOSIS — R1319 Other dysphagia: Secondary | ICD-10-CM | POA: Diagnosis not present

## 2022-10-20 DIAGNOSIS — I10 Essential (primary) hypertension: Secondary | ICD-10-CM | POA: Diagnosis not present

## 2022-10-20 DIAGNOSIS — K219 Gastro-esophageal reflux disease without esophagitis: Secondary | ICD-10-CM | POA: Diagnosis not present

## 2022-10-20 DIAGNOSIS — R0789 Other chest pain: Secondary | ICD-10-CM | POA: Diagnosis not present

## 2022-10-20 LAB — COMPREHENSIVE METABOLIC PANEL
ALT: 267 U/L — ABNORMAL HIGH (ref 0–35)
AST: 59 U/L — ABNORMAL HIGH (ref 0–37)
Albumin: 4.1 g/dL (ref 3.5–5.2)
Alkaline Phosphatase: 184 U/L — ABNORMAL HIGH (ref 39–117)
BUN: 9 mg/dL (ref 6–23)
CO2: 30 mEq/L (ref 19–32)
Calcium: 10.1 mg/dL (ref 8.4–10.5)
Chloride: 100 mEq/L (ref 96–112)
Creatinine, Ser: 0.69 mg/dL (ref 0.40–1.20)
GFR: 89.33 mL/min (ref >60.00)
Glucose, Bld: 112 mg/dL — ABNORMAL HIGH (ref 70–99)
Potassium: 4 mEq/L (ref 3.5–5.1)
Sodium: 140 mEq/L (ref 135–145)
Total Bilirubin: 0.3 mg/dL (ref 0.2–1.2)
Total Protein: 7.7 g/dL (ref 6.0–8.3)

## 2022-10-20 LAB — HEMOGLOBIN A1C: Hgb A1c MFr Bld: 7.8 % — ABNORMAL HIGH (ref 4.6–6.5)

## 2022-10-20 MED ORDER — METFORMIN HCL ER 500 MG PO TB24: 500.0000 mg | ORAL_TABLET | Freq: Two times a day (BID) | ORAL | 3 refills | Status: DC

## 2022-10-20 NOTE — Assessment & Plan Note (Addendum)
We can use Mounjaro, Ozempic, Trulicity if affordable Change Metformin XR to smaller tablets

## 2022-10-20 NOTE — Assessment & Plan Note (Signed)
Doing better BP Readings from Last 3 Encounters:  10/20/22 138/86  10/07/22 132/80  09/30/22 (!) 146/82

## 2022-10-20 NOTE — Assessment & Plan Note (Addendum)
Better after EGD/dilatation Cont w/Protonix

## 2022-10-20 NOTE — Progress Notes (Signed)
She  Subjective:  Patient ID: Renee Mann, female    DOB: 05-27-1956  Age: 67 y.o. MRN: [id number]  CC: Follow-up   HPI Renee Mann presents for GERD, DM, HTN. Dysphagia is better after dilatation  Outpatient Medications Prior to Visit  Medication Sig Dispense Refill   ACCU-CHEK GUIDE test strip USE TO CHECK BLOOD SUGARS ONCE A DAY 100 strip 3   Accu-Chek Softclix Lancets lancets USE TO CHECK BLOOD SUGAR DAILY 100 each 3   amLODipine (NORVASC) 5 MG tablet TAKE 1 TABLET BY MOUTH EVERY DAY 90 tablet 3   blood glucose meter kit and supplies Dispense based on patient and insurance preference. Use up to four times daily as directed. (FOR ICD-10 E10.9, E11.9). 1 each 0   Blood Glucose Monitoring Suppl (ACCU-CHEK AVIVA PLUS) w/Device KIT Use to check blood sugar daily. DX E11.9 1 kit 0   Cholecalciferol (VITAMIN D3) 50 MCG (2000 UT) capsule Take 1 capsule (2,000 Units total) by mouth daily. 100 capsule 3   clotrimazole (LOTRIMIN) 1 % cream APPLY 1 APPLICATION TOPICALLY IN THE MORNING AND AT BEDTIME. USE UNTIL RASH DISAPPEARS AND THEN ANOTHER 3 DAYS. 30 g 3   diphenhydrAMINE (BENADRYL) 25 MG tablet Take 25 mg by mouth every 6 (six) hours as needed.     hydrocortisone 2.5 % ointment Apply topically twice daily as need to red sandpapery rash. 30 g 3   hyoscyamine (LEVSIN SL) 0.125 MG SL tablet Place 1 tablet (0.125 mg total) under the tongue every 6 (six) hours as needed for cramping (nausea, AB pain). 30 tablet 0   IBUPROFEN PO Take by mouth as needed.     ketoconazole (NIZORAL) 2 % cream Apply 1 Application topically daily as needed (rash). 30 g 0   meloxicam (MOBIC) 15 MG tablet TAKE 1 TABLET BY MOUTH EVERY DAY AS NEEDED FOR PAIN 30 tablet 1   pantoprazole (PROTONIX) 40 MG tablet Take 1 tablet (40 mg total) by mouth daily. 30 tablet 5   repaglinide (PRANDIN) 1 MG tablet TAKE 1 TABLET BY MOUTH 3 TIMES DAILY BEFORE MEAL(S) 270 tablet 1   triamcinolone cream (KENALOG) 0.1 % APPLY TO AFFECTED AREA  TWICE A DAY 30 g 5   zolpidem (AMBIEN) 10 MG tablet Take 0.5-1 tablets (5-10 mg total) by mouth at bedtime as needed for sleep. 30 tablet 3   metFORMIN (GLUCOPHAGE-XR) 750 MG 24 hr tablet TAKE 1 TABLET BY MOUTH ONCE DAILY WITH BREAKFAST 90 tablet 3   Facility-Administered Medications Prior to Visit  Medication Dose Route Frequency Provider Last Rate Last Admin   dupilumab (DUPIXENT) prefilled syringe 300 mg  300 mg Subcutaneous Q14 Days Clemon Chambers, MD   300 mg at 10/14/22 0945    ROS: Review of Systems  Constitutional:  Negative for activity change, appetite change, chills, fatigue and unexpected weight change.  HENT:  Negative for congestion, mouth sores and sinus pressure.   Eyes:  Negative for visual disturbance.  Respiratory:  Negative for cough and chest tightness.   Gastrointestinal:  Negative for abdominal pain and nausea.  Genitourinary:  Negative for difficulty urinating, frequency and vaginal pain.  Musculoskeletal:  Negative for back pain and gait problem.  Skin:  Negative for pallor and rash.  Neurological:  Negative for dizziness, tremors, weakness, numbness and headaches.  Psychiatric/Behavioral:  Negative for confusion and sleep disturbance.     Objective:  BP 138/86 (BP Location: Left Arm, Patient Position: Sitting, Cuff Size: Large)   Pulse 82  Temp 97.6 F (36.4 C) (Temporal)   Ht '4\' 10"'$  (1.473 m)   Wt 143 lb (64.9 kg)   SpO2 100%   BMI 29.89 kg/m   BP Readings from Last 3 Encounters:  10/20/22 138/86  10/07/22 132/80  09/30/22 (!) 146/82    Wt Readings from Last 3 Encounters:  10/20/22 143 lb (64.9 kg)  10/07/22 143 lb 6.4 oz (65 kg)  09/30/22 142 lb 12.8 oz (64.8 kg)    Physical Exam Constitutional:      General: She is not in acute distress.    Appearance: She is well-developed.  HENT:     Head: Normocephalic.     Right Ear: External ear normal.     Left Ear: External ear normal.     Nose: Nose normal.  Eyes:     General:        Right  eye: No discharge.        Left eye: No discharge.     Conjunctiva/sclera: Conjunctivae normal.     Pupils: Pupils are equal, round, and reactive to light.  Neck:     Thyroid: No thyromegaly.     Vascular: No JVD.     Trachea: No tracheal deviation.  Cardiovascular:     Rate and Rhythm: Normal rate and regular rhythm.     Heart sounds: Normal heart sounds.  Pulmonary:     Effort: No respiratory distress.     Breath sounds: No stridor. No wheezing.  Abdominal:     General: Bowel sounds are normal. There is no distension.     Palpations: Abdomen is soft. There is no mass.     Tenderness: There is no abdominal tenderness. There is no guarding or rebound.  Musculoskeletal:        General: No tenderness.     Cervical back: Normal range of motion and neck supple. No rigidity.  Lymphadenopathy:     Cervical: No cervical adenopathy.  Skin:    Findings: No erythema or rash.  Neurological:     Cranial Nerves: No cranial nerve deficit.     Motor: No abnormal muscle tone.     Coordination: Coordination normal.     Deep Tendon Reflexes: Reflexes normal.  Psychiatric:        Behavior: Behavior normal.        Thought Content: Thought content normal.        Judgment: Judgment normal.     Lab Results  Component Value Date   WBC 6.1 06/09/2022   HGB 12.9 06/09/2022   HCT 39.6 06/09/2022   PLT 438.0 (H) 06/09/2022   GLUCOSE 110 (H) 06/09/2022   CHOL 193 03/10/2022   TRIG 112.0 03/10/2022   HDL 42.40 03/10/2022   LDLCALC 128 (H) 03/10/2022   ALT 19 06/09/2022   AST 16 06/09/2022   NA 139 06/09/2022   K 3.8 06/09/2022   CL 102 06/09/2022   CREATININE 0.78 06/09/2022   BUN 8 06/09/2022   CO2 28 06/09/2022   TSH 1.93 06/09/2022   HGBA1C 7.5 (H) 06/09/2022   MICROALBUR 1.6 09/16/2020    DG ESOPHAGUS W DOUBLE CM (HD)  Result Date: 10/19/2022 CLINICAL DATA:  History of GERD, atypical chest pain, gastric lesion and H pylori. Patient underwent EGD dilation of esophagus November  2023. She continues to complain of solid-food dysphagia with feeling of food becoming lodged in throat and mid esophagus. Request received for fluoroscopic esophagram study. EXAM: ESOPHAGUS/BARIUM SWALLOW/TABLET STUDY TECHNIQUE: Combined double and single contrast examination was  performed using effervescent crystals, high-density barium, and thin liquid barium. This exam was performed by Narda Rutherford, NP, and was supervised and interpreted by Corrie Mckusick, MD. FLUOROSCOPY: Radiation Exposure Index (as provided by the fluoroscopic device): 11.70 mGy Kerma COMPARISON:  None Available. FINDINGS: Swallowing: Appears normal. No vestibular penetration or aspiration seen. Pharynx: Unremarkable. Esophagus: Normal appearance with no mass or stricture noted. Esophageal motility: Within normal limits. Hiatal Hernia: None. Gastroesophageal reflux: Moderate volume gastroesophageal reflux to mid esophagus without provocation maneuvers. Ingested 65m barium tablet: 13 mm barium tablet passed normally. Other: None. IMPRESSION: Gastroesophageal reflux observed, otherwise unremarkable. Read by: SNarda Rutherford AGNP-BC Electronically Signed   By: JCorrie MckusickD.O.   On: 10/19/2022 10:12    Assessment & Plan:   Problem List Items Addressed This Visit       Cardiovascular and Mediastinum   Benign essential hypertension    Doing better BP Readings from Last 3 Encounters:  10/20/22 138/86  10/07/22 132/80  09/30/22 (!) 146/82          Digestive   Gastroesophageal reflux disease    Change Metformin XR to smaller tablets Better after EGD      Dysphagia    Change Metformin XR to smaller tablets Better after esoph dilatation Treating GERD        Endocrine   Type 2 diabetes mellitus ([place]    We can use Mounjaro, Ozempic, Trulicity if affordable Change Metformin XR to smaller tablets      Relevant Medications   metFORMIN (GLUCOPHAGE-XR) 500 MG 24 hr tablet   Other Relevant Orders   Comprehensive  metabolic panel   Hemoglobin A1c     Other   Midsternal chest pain    Better after EGD/dilatation Cont w/Protonix         Meds ordered this encounter  Medications   metFORMIN (GLUCOPHAGE-XR) 500 MG 24 hr tablet    Sig: Take 1 tablet (500 mg total) by mouth 2 (two) times daily with a meal.    Dispense:  180 tablet    Refill:  3      Follow-up: Return in about 3 months (around 01/18/2023) for a follow-up visit.  AWalker Kehr MD

## 2022-10-20 NOTE — Assessment & Plan Note (Signed)
Change Metformin XR to smaller tablets Better after esoph dilatation Treating GERD

## 2022-10-20 NOTE — Patient Instructions (Signed)
We can use Mounjaro, Ozempic, Trulicity if affordable

## 2022-10-20 NOTE — Assessment & Plan Note (Signed)
Change Metformin XR to smaller tablets Better after EGD

## 2022-10-25 ENCOUNTER — Other Ambulatory Visit: Payer: Self-pay | Admitting: Internal Medicine

## 2022-10-28 ENCOUNTER — Ambulatory Visit (INDEPENDENT_AMBULATORY_CARE_PROVIDER_SITE_OTHER): Payer: Medicare Other

## 2022-10-28 ENCOUNTER — Ambulatory Visit (HOSPITAL_COMMUNITY): Payer: PRIVATE HEALTH INSURANCE

## 2022-10-28 DIAGNOSIS — L209 Atopic dermatitis, unspecified: Secondary | ICD-10-CM

## 2022-10-29 ENCOUNTER — Telehealth: Payer: Self-pay | Admitting: Internal Medicine

## 2022-10-29 NOTE — Telephone Encounter (Signed)
Pls advise per lasr visit does not mention to take pantoprazole twice a day.Marland KitchenJohny Mann

## 2022-10-29 NOTE — Telephone Encounter (Signed)
Prescription Request  10/29/2022  LOV: 10/20/2022  What is the name of the medication or equipment? Pantoprazole 40 mg, pt states it should be changed to taking 1 tablet twice a day  Have you contacted your pharmacy to request a refill? Yes   Which pharmacy would you like this sent to?  [id number] [address]Phone:[phone]:[phone]   Patient notified that their request is being sent to the clinical staff for review and that they should receive a response within 2 business days.   Please advise at [place] [phone]

## 2022-10-30 MED ORDER — PANTOPRAZOLE SODIUM 40 MG PO TBEC: 40.0000 mg | DELAYED_RELEASE_TABLET | Freq: Two times a day (BID) | ORAL | 1 refills | Status: DC

## 2022-10-30 NOTE — Telephone Encounter (Signed)
Okay.  Done.  Thanks 

## 2022-11-03 ENCOUNTER — Other Ambulatory Visit: Payer: Self-pay | Admitting: Gastroenterology

## 2022-11-03 ENCOUNTER — Other Ambulatory Visit (HOSPITAL_COMMUNITY): Payer: Self-pay | Admitting: Gastroenterology

## 2022-11-03 ENCOUNTER — Ambulatory Visit (HOSPITAL_COMMUNITY)
Admission: RE | Admit: 2022-11-03 | Discharge: 2022-11-03 | Disposition: A | Discharge status: Home / Self Care | Payer: Medicare Other | Source: Ambulatory Visit | Attending: Gastroenterology | Admitting: Gastroenterology

## 2022-11-03 DIAGNOSIS — R079 Chest pain, unspecified: Secondary | ICD-10-CM | POA: Diagnosis not present

## 2022-11-03 DIAGNOSIS — K319 Disease of stomach and duodenum, unspecified: Secondary | ICD-10-CM

## 2022-11-03 DIAGNOSIS — R0789 Other chest pain: Secondary | ICD-10-CM

## 2022-11-03 DIAGNOSIS — K3189 Other diseases of stomach and duodenum: Secondary | ICD-10-CM | POA: Diagnosis not present

## 2022-11-03 DIAGNOSIS — D179 Benign lipomatous neoplasm, unspecified: Secondary | ICD-10-CM

## 2022-11-03 MED ORDER — IOHEXOL 300 MG/ML  SOLN
100.0000 mL | Freq: Once | INTRAMUSCULAR | Status: AC | PRN
  Administered 2022-11-03: 100 mL via INTRAVENOUS

## 2022-11-03 MED ORDER — SODIUM CHLORIDE (PF) 0.9 % IJ SOLN
INTRAMUSCULAR | Status: AC
  Filled 2022-11-03: qty 50

## 2022-11-06 ENCOUNTER — Other Ambulatory Visit: Payer: Self-pay

## 2022-11-06 ENCOUNTER — Telehealth: Payer: Self-pay

## 2022-11-06 DIAGNOSIS — K319 Disease of stomach and duodenum, unspecified: Secondary | ICD-10-CM

## 2022-11-06 NOTE — Telephone Encounter (Signed)
-----   Message from Yetta Flock, MD sent at 11/06/2022  7:42 AM EDT ----- Regarding: RE: follow up - possible EUS Thanks Gabe  ----- Message ----- From: Irving Copas., MD Sent: 11/05/2022  11:25 PM EDT To: Timothy Lasso, RN; Yetta Flock, MD Subject: RE: follow up - possible EUS                   SA, Sounds reasonable with negative CT.  Ermal Haberer, Please offer this patient a nonurgent EUS for gastric nodule/gastric subepithelial lesion. Thanks. GM ----- Message ----- From: Yetta Flock, MD Sent: 11/05/2022   7:16 AM EDT To: Irving Copas., MD Subject: follow up - possible EUS                       Sloan Leiter, Did a CT for gastric antral lesion noted on prior EGD to see if this was a lipoma or not, they are not seeing it on CT. Curious if you would be willing to offer EUS in upcoming months, it is not urgent. Thanks  Richardson Landry

## 2022-11-06 NOTE — Telephone Encounter (Signed)
The pt has been scheduled for EUS on 12/16/22 at 915 am at [facility] with GM   Left message on machine to call back

## 2022-11-06 NOTE — Telephone Encounter (Signed)
EUS scheduled, pt instructed and medications reviewed.  Patient instructions mailed to home.  Patient to call with any questions or concerns.  

## 2022-11-11 ENCOUNTER — Ambulatory Visit (INDEPENDENT_AMBULATORY_CARE_PROVIDER_SITE_OTHER): Payer: Medicare Other

## 2022-11-11 DIAGNOSIS — L209 Atopic dermatitis, unspecified: Secondary | ICD-10-CM | POA: Diagnosis not present

## 2022-11-11 DIAGNOSIS — H2511 Age-related nuclear cataract, right eye: Secondary | ICD-10-CM | POA: Diagnosis not present

## 2022-11-25 ENCOUNTER — Ambulatory Visit (INDEPENDENT_AMBULATORY_CARE_PROVIDER_SITE_OTHER): Payer: Medicare Other

## 2022-11-25 DIAGNOSIS — L209 Atopic dermatitis, unspecified: Secondary | ICD-10-CM

## 2022-11-26 ENCOUNTER — Other Ambulatory Visit: Payer: Self-pay | Admitting: Internal Medicine

## 2022-12-09 ENCOUNTER — Encounter (HOSPITAL_COMMUNITY): Payer: Self-pay | Admitting: Gastroenterology

## 2022-12-09 ENCOUNTER — Ambulatory Visit (INDEPENDENT_AMBULATORY_CARE_PROVIDER_SITE_OTHER): Payer: Medicare Other

## 2022-12-09 DIAGNOSIS — L209 Atopic dermatitis, unspecified: Secondary | ICD-10-CM

## 2022-12-13 ENCOUNTER — Other Ambulatory Visit: Payer: Self-pay | Admitting: Internal Medicine

## 2022-12-15 NOTE — Anesthesia Preprocedure Evaluation (Signed)
Anesthesia Evaluation  Patient identified by MRN, date of birth, ID band Patient awake    Reviewed: Allergy & Precautions, NPO status , Patient's Chart, lab work & pertinent test results  History of Anesthesia Complications Negative for: history of anesthetic complications  Airway Mallampati: III  TM Distance: >3 FB Neck ROM: Full    Dental no notable dental hx. (+) Dental Advisory Given   Pulmonary former smoker   Pulmonary exam normal        Cardiovascular hypertension, Normal cardiovascular exam  IMPRESSIONS     1. Left ventricular ejection fraction, by estimation, is 60 to 65%. Left  ventricular ejection fraction by 3D volume is 60 %. The left ventricle has  normal function. The left ventricle has no regional wall motion  abnormalities. There is mild concentric  left ventricular hypertrophy. Left ventricular diastolic parameters are  consistent with Grade I diastolic dysfunction (impaired relaxation). The  average left ventricular global longitudinal strain is -19.0 %. The global  longitudinal strain is normal.   2. Right ventricular systolic function is normal. The right ventricular  size is normal. Tricuspid regurgitation signal is inadequate for assessing  PA pressure.   3. The mitral valve is normal in structure. No evidence of mitral valve  regurgitation. No evidence of mitral stenosis.   4. The aortic valve is tricuspid. There is mild thickening of the aortic  valve. Aortic valve regurgitation is not visualized. Aortic valve  sclerosis is present, with no evidence of aortic valve stenosis.   5. The inferior vena cava is normal in size with greater than 50%  respiratory variability, suggesting right atrial pressure of 3 mmHg.   Comparison(s): No prior Echocardiogram.     Neuro/Psych negative neurological ROS     GI/Hepatic negative GI ROS, Neg liver ROS,,,  Endo/Other  diabetes    Renal/GU negative Renal  ROS     Musculoskeletal negative musculoskeletal ROS (+)    Abdominal   Peds  Hematology negative hematology ROS (+)   Anesthesia Other Findings   Reproductive/Obstetrics                             Anesthesia Physical Anesthesia Plan  ASA: 2  Anesthesia Plan: MAC   Post-op Pain Management: Minimal or no pain anticipated   Induction:   PONV Risk Score and Plan: Ondansetron and Propofol infusion  Airway Management Planned: Natural Airway  Additional Equipment:   Intra-op Plan:   Post-operative Plan: Extubation in OR  Informed Consent: I have reviewed the patients History and Physical, chart, labs and discussed the procedure including the risks, benefits and alternatives for the proposed anesthesia with the patient or authorized representative who has indicated his/her understanding and acceptance.     Dental advisory given  Plan Discussed with: Anesthesiologist and CRNA  Anesthesia Plan Comments:         Anesthesia Quick Evaluation

## 2022-12-16 ENCOUNTER — Other Ambulatory Visit: Payer: Self-pay

## 2022-12-16 ENCOUNTER — Ambulatory Visit (HOSPITAL_COMMUNITY): Payer: Medicare Other | Admitting: Anesthesiology

## 2022-12-16 ENCOUNTER — Ambulatory Visit (HOSPITAL_COMMUNITY)
Admission: RE | Admit: 2022-12-16 | Discharge: 2022-12-16 | Disposition: A | Discharge status: Home / Self Care | Payer: Medicare Other | Attending: Gastroenterology | Admitting: Gastroenterology

## 2022-12-16 ENCOUNTER — Encounter (HOSPITAL_COMMUNITY): Payer: Self-pay | Admitting: Gastroenterology

## 2022-12-16 ENCOUNTER — Encounter (HOSPITAL_COMMUNITY)
Admission: RE | Disposition: A | Discharge status: Home / Self Care | Payer: Self-pay | Source: Home / Self Care | Attending: Gastroenterology

## 2022-12-16 ENCOUNTER — Ambulatory Visit (HOSPITAL_BASED_OUTPATIENT_CLINIC_OR_DEPARTMENT_OTHER): Payer: Medicare Other | Admitting: Anesthesiology

## 2022-12-16 DIAGNOSIS — E119 Type 2 diabetes mellitus without complications: Secondary | ICD-10-CM | POA: Diagnosis not present

## 2022-12-16 DIAGNOSIS — K2289 Other specified disease of esophagus: Secondary | ICD-10-CM | POA: Diagnosis not present

## 2022-12-16 DIAGNOSIS — Z87891 Personal history of nicotine dependence: Secondary | ICD-10-CM

## 2022-12-16 DIAGNOSIS — K449 Diaphragmatic hernia without obstruction or gangrene: Secondary | ICD-10-CM | POA: Insufficient documentation

## 2022-12-16 DIAGNOSIS — I10 Essential (primary) hypertension: Secondary | ICD-10-CM | POA: Diagnosis not present

## 2022-12-16 DIAGNOSIS — I517 Cardiomegaly: Secondary | ICD-10-CM | POA: Insufficient documentation

## 2022-12-16 DIAGNOSIS — Z833 Family history of diabetes mellitus: Secondary | ICD-10-CM | POA: Diagnosis not present

## 2022-12-16 DIAGNOSIS — K219 Gastro-esophageal reflux disease without esophagitis: Secondary | ICD-10-CM | POA: Diagnosis not present

## 2022-12-16 DIAGNOSIS — I358 Other nonrheumatic aortic valve disorders: Secondary | ICD-10-CM | POA: Insufficient documentation

## 2022-12-16 DIAGNOSIS — K8020 Calculus of gallbladder without cholecystitis without obstruction: Secondary | ICD-10-CM | POA: Diagnosis not present

## 2022-12-16 DIAGNOSIS — K2950 Unspecified chronic gastritis without bleeding: Secondary | ICD-10-CM | POA: Insufficient documentation

## 2022-12-16 DIAGNOSIS — I119 Hypertensive heart disease without heart failure: Secondary | ICD-10-CM

## 2022-12-16 DIAGNOSIS — K3189 Other diseases of stomach and duodenum: Secondary | ICD-10-CM | POA: Diagnosis not present

## 2022-12-16 DIAGNOSIS — K319 Disease of stomach and duodenum, unspecified: Secondary | ICD-10-CM

## 2022-12-16 DIAGNOSIS — K9189 Other postprocedural complications and disorders of digestive system: Secondary | ICD-10-CM | POA: Diagnosis not present

## 2022-12-16 HISTORY — PX: ESOPHAGOGASTRODUODENOSCOPY (EGD) WITH PROPOFOL: SHX5813

## 2022-12-16 HISTORY — PX: EUS: SHX5427

## 2022-12-16 HISTORY — PX: BIOPSY: SHX5522

## 2022-12-16 LAB — GLUCOSE, CAPILLARY: Glucose-Capillary: 109 mg/dL — ABNORMAL HIGH (ref 70–99)

## 2022-12-16 SURGERY — UPPER ENDOSCOPIC ULTRASOUND (EUS) RADIAL
Anesthesia: Monitor Anesthesia Care

## 2022-12-16 MED ORDER — PROPOFOL 10 MG/ML IV BOLUS
INTRAVENOUS | Status: DC | PRN
  Administered 2022-12-16: 30 mg via INTRAVENOUS
  Administered 2022-12-16 (×2): 20 mg via INTRAVENOUS

## 2022-12-16 MED ORDER — PROPOFOL 500 MG/50ML IV EMUL
INTRAVENOUS | Status: DC | PRN
  Administered 2022-12-16: 125 ug/kg/min via INTRAVENOUS

## 2022-12-16 MED ORDER — SODIUM CHLORIDE 0.9 % IV SOLN: INTRAVENOUS | Status: DC

## 2022-12-16 MED ORDER — LACTATED RINGERS IV SOLN: INTRAVENOUS | Status: DC

## 2022-12-16 MED ORDER — PROPOFOL 500 MG/50ML IV EMUL
INTRAVENOUS | Status: AC
  Filled 2022-12-16: qty 50

## 2022-12-16 MED ORDER — ESMOLOL HCL 100 MG/10ML IV SOLN
INTRAVENOUS | Status: DC | PRN
  Administered 2022-12-16: 10 mg via INTRAVENOUS
  Administered 2022-12-16: 5 mg via INTRAVENOUS
  Administered 2022-12-16: 30 mg via INTRAVENOUS

## 2022-12-16 NOTE — Transfer of Care (Signed)
Immediate Anesthesia Transfer of Care Note  Patient: Renee Mann  Procedure(s) Performed: UPPER ENDOSCOPIC ULTRASOUND (EUS) RADIAL ESOPHAGOGASTRODUODENOSCOPY (EGD) WITH PROPOFOL BIOPSY  Patient Location: PACU and Endoscopy Unit  Anesthesia Type:MAC  Level of Consciousness: awake, alert , oriented, and patient cooperative  Airway & Oxygen Therapy: Patient Spontanous Breathing and Patient connected to face mask oxygen  Post-op Assessment: Report given to RN, Post -op Vital signs reviewed and stable, and Patient moving all extremities  Post vital signs: Reviewed and stable  Last Vitals:  Vitals Value Taken Time  BP    Temp    Pulse    Resp    SpO2      Last Pain:  Vitals:   12/16/22 0810  TempSrc: Temporal  PainSc: 0-No pain         Complications: No notable events documented.

## 2022-12-16 NOTE — Op Note (Signed)
[facility] Procedure Date: 12/16/2022 MRN: [id number] Attending MD: Corliss Parish , MD, [id number] Date of Birth: Nov 12, 1955 CSN: [id number] Age: 67 Admit Type: Outpatient Procedure:                Upper EUS Indications:              Gastric deformity on endoscopy/Subepithelial tumor                            versus extrinsic compression, Previously treated                            for Helicobacter pylori Providers:                Corliss Parish, MD, Lorenza Evangelist, RN,                            Kandice Robinsons, Technician Referring MD:             Willaim Rayas. Adela Lank, MD, Georgina Quint. Plotnikov MD,                            MD Medicines:                Monitored Anesthesia Care Complications:            No immediate complications. Estimated Blood Loss:     Estimated blood loss was minimal. Procedure:                Pre-Anesthesia Assessment:                           - Prior to the procedure, a History and Physical                            was performed, and patient medications and                            allergies were reviewed. The patient's tolerance of                            previous anesthesia was also reviewed. The risks                            and benefits of the procedure and the sedation                            options and risks were discussed with the patient.                            All questions were answered, and informed consent                            was obtained. Prior Anticoagulants: The patient has                            taken no anticoagulant  or antiplatelet agents                            except for NSAID medication. ASA Grade Assessment:                            III - A patient with severe systemic disease. After                            reviewing the risks and benefits, the patient was                            deemed in satisfactory condition to undergo the                             procedure.                           After obtaining informed consent, the endoscope was                            passed under direct vision. Throughout the                            procedure, the patient's blood pressure, pulse, and                            oxygen saturations were monitored continuously. The                            [id number] ([id number]) Olympus therapeutic endoscope                            was introduced through the mouth, and advanced to                            the second part of duodenum. The [id number]                            ([id number]) Olympus radial ultrasound scope was                            introduced through the mouth, and advanced to the                            duodenum for ultrasound examination from the                            stomach and duodenum. The upper EUS was                            accomplished without difficulty. The patient                            tolerated  the procedure. Scope In: Scope Out: Findings:      ENDOSCOPIC FINDING: :      No gross lesions were noted in the entire esophagus.      The Z-line was irregular and was found 36 cm from the incisors.      A 1 cm hiatal hernia was present.      A small scar was found in the gastric antrum (from previous biopsies).       There appeared to be a very mild extrinsic impression in this area. No       overt mass or lesion was noted however.      Patchy mildly erythematous mucosa without bleeding was found in the       entire examined stomach. As result of the patient previously having H.       pylori, biopsies were taken with a cold forceps for histology and       Helicobacter pylori testing to ensure eradication.      No gross lesions were noted in the duodenal bulb, in the first portion       of the duodenum and in the second portion of the duodenum.      ENDOSONOGRAPHIC FINDING: :      There was mild extrinsic impression in the antrum of the stomach.        Endosonographic examination showed this compression to be due to a       gallbladder filled with stones.      Endosonographic imaging in the cardia of the stomach, in the fundus of       the stomach, in the body of the stomach and in the antrum of the stomach       showed no intramural (subepithelial) lesion or wall thickening.      Endosonographic imaging in the visualized portion of the liver showed no       mass.      The celiac region was visualized. Impression:               EGD impression:                           - No gross lesions in the entire esophagus. Z-line                            irregular, 36 cm from the incisors.                           - 1 cm hiatal hernia.                           - Scar in the gastric antrum from previous                            biopsies. Mild extrinsic compression noted.                           - Erythematous mucosa in the stomach. Biopsied for                            follow-up of previous H. pylori infection to ensure  eradication.                           - No gross lesions in the duodenal bulb, in the                            first portion of the duodenum and in the second                            portion of the duodenum.                           EUS impression:                           - Extrinsic impression was noted in the antrum of                            the stomach due to a gallbladder filled with stones.                           - The rest of the visualized stomach showed no                            abnormalities. Moderate Sedation:      Not Applicable - Patient had care per Anesthesia. Recommendation:           - The patient will be observed post-procedure,                            until all discharge criteria are met.                           - Discharge patient to home.                           - Patient has a contact number available for                             emergencies. The signs and symptoms of potential                            delayed complications were discussed with the                            patient. Return to normal activities tomorrow.                            Written discharge instructions were provided to the                            patient.                           - Resume previous diet.                           -  Observe patient's clinical course.                           - Await path results.                           - Continue present medications.                           - Follow-up with primary gastroenterologist as                            needed. If evidence of H. pylori is still present,                            will need to consider additional treatment.                           - The findings and recommendations were discussed                            with the patient.                           - The findings and recommendations were discussed                            with the patient's family. Procedure Code(s):        --- Professional ---                           [phone], Esophagogastroduodenoscopy, flexible,                            transoral; with endoscopic ultrasound examination                            limited to the esophagus, stomach or duodenum, and                            adjacent structures                           43239, Esophagogastroduodenoscopy, flexible,                            transoral; with biopsy, single or multiple Diagnosis Code(s):        --- Professional ---                           K22.89, Other specified disease of esophagus                           K44.9, Diaphragmatic hernia without obstruction or                            gangrene  K31.89, Other diseases of stomach and duodenum CPT copyright 2022 [employer]. All rights reserved. The codes documented in this report are preliminary and upon coder review may  be  revised to meet current compliance requirements. Corliss Parish, MD 12/16/2022 9:49:07 AM Number of Addenda: 0

## 2022-12-16 NOTE — Anesthesia Postprocedure Evaluation (Signed)
Anesthesia Post Note  Patient: Renee Mann  Procedure(s) Performed: UPPER ENDOSCOPIC ULTRASOUND (EUS) RADIAL ESOPHAGOGASTRODUODENOSCOPY (EGD) WITH PROPOFOL BIOPSY     Patient location during evaluation: Endoscopy Anesthesia Type: MAC Level of consciousness: awake and alert Pain management: pain level controlled Vital Signs Assessment: post-procedure vital signs reviewed and stable Respiratory status: spontaneous breathing and respiratory function stable Cardiovascular status: stable Postop Assessment: no apparent nausea or vomiting Anesthetic complications: no   No notable events documented.  Last Vitals:  Vitals:   12/16/22 1000 12/16/22 1010  BP: (!) 149/85 (!) 146/76  Pulse: (!) 108 96  Resp: 15 15  Temp:    SpO2: 98% 95%    Last Pain:  Vitals:   12/16/22 1010  TempSrc:   PainSc: 7                  [place] [facility] DANIEL

## 2022-12-16 NOTE — Anesthesia Procedure Notes (Signed)
Procedure Name: MAC Date/Time: 12/16/2022 9:10 AM  Performed by: Nelle Don, CRNAPre-anesthesia Checklist: Emergency Drugs available, Patient identified, Suction available and Patient being monitored Oxygen Delivery Method: Simple face mask

## 2022-12-16 NOTE — H&P (Signed)
GASTROENTEROLOGY PROCEDURE H&P NOTE   Primary Care Physician: Tresa Garter, MD  HPI: Renee Mann is a 67 y.o. female who presents for EGD/EUS to evaluate gastric SEL of the antrum.  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   URI (upper respiratory infection) 11/28/2009   Past Surgical History:  Procedure Laterality Date   ABDOMINAL HYSTERECTOMY  1987   burritis surgery Left 1998   hip   Current Facility-Administered Medications  Medication Dose Route Frequency Provider Last Rate Last Admin   0.9 %  sodium chloride infusion   Intravenous Continuous Mansouraty, Netty Starring., MD       lactated ringers infusion   Intravenous Continuous Mansouraty, Netty Starring., MD 50 mL/hr at 12/16/22 0815 New Bag at 12/16/22 0815    Current Facility-Administered Medications:    0.9 %  sodium chloride infusion, , Intravenous, Continuous, Mansouraty, Netty Starring., MD   lactated ringers infusion, , Intravenous, Continuous, Mansouraty, Netty Starring., MD, Last Rate: 50 mL/hr at 12/16/22 0815, New Bag at 12/16/22 0815 Allergies  Allergen Reactions   Codeine Hives   Atenolol Other (See Comments)    Weird feelings   Hydrochlorothiazide W-Triamterene Other (See Comments)    headache   Family History  Problem Relation Age of Onset   Hypertension Mother    Cancer Mother    Stroke Mother    Hypertension Sister    Diabetes Sister    Hypertension Other    Colon cancer Neg Hx    Esophageal cancer Neg Hx    Stomach cancer Neg Hx    Rectal cancer Neg Hx    Social History   Socioeconomic History   Marital status: Married    Spouse name: Not on file   Number of children: 1   Years of education: Not on file   Highest education level: Not on file  Occupational History   Occupation: housewife    Comment: staying at home with  kids  Tobacco Use   Smoking status: Former    Types: Cigarettes    Quit date: 08/24/1998    Years since quitting: 24.3   Smokeless tobacco: Never  Vaping Use   Vaping Use: Never used  Substance and Sexual Activity   Alcohol use: No    Alcohol/week: 0.0 standard drinks of alcohol   Drug use: No   Sexual activity: Yes  Other Topics Concern   Not on file  Social History Narrative   ** Merged History Encounter **       Social Determinants of Health   Financial Resource Strain: Low Risk  (06/08/2022)   Overall Financial Resource Strain (CARDIA)    Difficulty of Paying Living Expenses: Not hard at all  Food Insecurity: No Food Insecurity (06/08/2022)   Hunger Vital Sign    Worried About Running Out of Food in the Last Year: Never true    Ran Out of Food in the Last Year: Never true  Transportation Needs: No Transportation Needs (06/08/2022)   PRAPARE - Administrator, Civil Service (Medical): No    Lack of Transportation (Non-Medical): No  Physical Activity: Insufficiently Active (06/08/2022)   Exercise Vital Sign    Days of Exercise per Week: 3 days    Minutes of Exercise per  Session: 30 min  Stress: No Stress Concern Present (06/08/2022)   [employer] of Occupational Health - Occupational Stress Questionnaire    Feeling of Stress : Not at all  Social Connections: Moderately Isolated (06/08/2022)   Social Connection and Isolation Panel [NHANES]    Frequency of Communication with Friends and Family: More than three times a week    Frequency of Social Gatherings with Friends and Family: More than three times a week    Attends Religious Services: Never    Database administrator or Organizations: No    Attends Banker Meetings: Never    Marital Status: Married  Catering manager Violence: Not At Risk (06/08/2022)   Humiliation, Afraid, Rape, and Kick questionnaire    Fear of Current or Ex-Partner: No    Emotionally Abused: No    Physically Abused:  No    Sexually Abused: No    Physical Exam: Today's Vitals   12/09/22 1209 12/16/22 0810  BP:  (!) 164/68  Pulse:  78  Resp:  20  Temp:  97.7 F (36.5 C)  TempSrc:  Temporal  SpO2:  99%  Weight: 64.4 kg 64.9 kg  Height:   (1.473 m)  PainSc:  0-No pain   Body mass index is 29.89 kg/m. GEN: NAD EYE: Sclerae anicteric ENT: MMM CV: Non-tachycardic GI: Soft, NT/ND NEURO:  Alert & Oriented x 3  Lab Results: No results for input(s): "WBC", "HGB", "HCT", "PLT" in the last 72 hours. BMET No results for input(s): "NA", "K", "CL", "CO2", "GLUCOSE", "BUN", "CREATININE", "CALCIUM" in the last 72 hours. LFT No results for input(s): "PROT", "ALBUMIN", "AST", "ALT", "ALKPHOS", "BILITOT", "BILIDIR", "IBILI" in the last 72 hours. PT/INR No results for input(s): "LABPROT", "INR" in the last 72 hours.   Impression / Plan: This is a 67 y.o.female who presents for EGD/EUS to evaluate gastric SEL of the antrum.  The risks and benefits of endoscopic evaluation/treatment were discussed with the patient and/or family; these include but are not limited to the risk of perforation, infection, bleeding, missed lesions, lack of diagnosis, severe illness requiring hospitalization, as well as anesthesia and sedation related illnesses.  The patient's history has been reviewed, patient examined, no change in status, and deemed stable for procedure.  The patient and/or family is agreeable to proceed.    Renee Parish, MD [facility] Office # [phone]

## 2022-12-16 NOTE — Discharge Instructions (Signed)
YOU HAD AN ENDOSCOPIC PROCEDURE TODAY: Refer to the procedure report and other information in the discharge instructions given to you for any specific questions about what was found during the examination. If this information does not answer your questions, please call [place] office at [phone] to clarify.   YOU SHOULD EXPECT: Some feelings of bloating in the abdomen. Passage of more gas than usual. Walking can help get rid of the air that was put into your GI tract during the procedure and reduce the bloating. If you had a lower endoscopy (such as a colonoscopy or flexible sigmoidoscopy) you may notice spotting of blood in your stool or on the toilet paper. Some abdominal soreness may be present for a day or two, also.  DIET: Your first meal following the procedure should be a light meal and then it is ok to progress to your normal diet. A half-sandwich or bowl of soup is an example of a good first meal. Heavy or fried foods are harder to digest and may make you feel nauseous or bloated. Drink plenty of fluids but you should avoid alcoholic beverages for 24 hours. If you had a esophageal dilation, please see attached instructions for diet.    ACTIVITY: Your care partner should take you home directly after the procedure. You should plan to take it easy, moving slowly for the rest of the day. You can resume normal activity the day after the procedure however YOU SHOULD NOT DRIVE, use power tools, machinery or perform tasks that involve climbing or major physical exertion for 24 hours (because of the sedation medicines used during the test).   SYMPTOMS TO REPORT IMMEDIATELY: A gastroenterologist can be reached at any hour. Please call [phone]  for any of the following symptoms:   Following upper endoscopy (EGD, EUS, ERCP, esophageal dilation) Vomiting of blood or coffee ground material  New, significant abdominal pain  New, significant chest pain or pain under the shoulder blades  Painful or  persistently difficult swallowing  New shortness of breath  Black, tarry-looking or red, bloody stools  FOLLOW UP:  If any biopsies were taken you will be contacted by phone or by letter within the next 1-3 weeks. Call [phone]  if you have not heard about the biopsies in 3 weeks.  Please also call with any specific questions about appointments or follow up tests.  

## 2022-12-18 DIAGNOSIS — H2513 Age-related nuclear cataract, bilateral: Secondary | ICD-10-CM | POA: Diagnosis not present

## 2022-12-18 DIAGNOSIS — Z961 Presence of intraocular lens: Secondary | ICD-10-CM | POA: Diagnosis not present

## 2022-12-18 DIAGNOSIS — H269 Unspecified cataract: Secondary | ICD-10-CM | POA: Diagnosis not present

## 2022-12-18 DIAGNOSIS — H2511 Age-related nuclear cataract, right eye: Secondary | ICD-10-CM | POA: Diagnosis not present

## 2022-12-18 HISTORY — PX: CATARACT EXTRACTION: SUR2

## 2022-12-18 LAB — SURGICAL PATHOLOGY

## 2022-12-20 ENCOUNTER — Encounter (HOSPITAL_COMMUNITY): Payer: Self-pay | Admitting: Gastroenterology

## 2022-12-22 ENCOUNTER — Encounter: Payer: Self-pay | Admitting: Gastroenterology

## 2022-12-23 ENCOUNTER — Ambulatory Visit (INDEPENDENT_AMBULATORY_CARE_PROVIDER_SITE_OTHER): Payer: Medicare Other | Admitting: *Deleted

## 2022-12-23 DIAGNOSIS — L209 Atopic dermatitis, unspecified: Secondary | ICD-10-CM

## 2023-01-06 ENCOUNTER — Ambulatory Visit (INDEPENDENT_AMBULATORY_CARE_PROVIDER_SITE_OTHER): Payer: Medicare Other | Admitting: *Deleted

## 2023-01-06 DIAGNOSIS — L209 Atopic dermatitis, unspecified: Secondary | ICD-10-CM | POA: Diagnosis not present

## 2023-01-08 DIAGNOSIS — H2513 Age-related nuclear cataract, bilateral: Secondary | ICD-10-CM | POA: Diagnosis not present

## 2023-01-08 DIAGNOSIS — Z961 Presence of intraocular lens: Secondary | ICD-10-CM | POA: Diagnosis not present

## 2023-01-08 DIAGNOSIS — H2512 Age-related nuclear cataract, left eye: Secondary | ICD-10-CM | POA: Diagnosis not present

## 2023-01-17 ENCOUNTER — Encounter: Payer: Self-pay | Admitting: Internal Medicine

## 2023-01-25 ENCOUNTER — Other Ambulatory Visit: Payer: Self-pay | Admitting: Internal Medicine

## 2023-01-26 ENCOUNTER — Ambulatory Visit: Payer: PRIVATE HEALTH INSURANCE | Admitting: Internal Medicine

## 2023-01-26 ENCOUNTER — Ambulatory Visit (INDEPENDENT_AMBULATORY_CARE_PROVIDER_SITE_OTHER): Payer: Medicare Other | Admitting: *Deleted

## 2023-01-26 DIAGNOSIS — L209 Atopic dermatitis, unspecified: Secondary | ICD-10-CM | POA: Diagnosis not present

## 2023-01-29 ENCOUNTER — Encounter: Payer: Self-pay | Admitting: Internal Medicine

## 2023-01-29 ENCOUNTER — Ambulatory Visit (INDEPENDENT_AMBULATORY_CARE_PROVIDER_SITE_OTHER): Payer: Medicare Other | Admitting: Internal Medicine

## 2023-01-29 VITALS — BP 124/82 | HR 95 | Temp 98.1°F | Ht <= 58 in | Wt 144.0 lb

## 2023-01-29 DIAGNOSIS — Z7984 Long term (current) use of oral hypoglycemic drugs: Secondary | ICD-10-CM

## 2023-01-29 DIAGNOSIS — E119 Type 2 diabetes mellitus without complications: Secondary | ICD-10-CM | POA: Diagnosis not present

## 2023-01-29 DIAGNOSIS — M1990 Unspecified osteoarthritis, unspecified site: Secondary | ICD-10-CM | POA: Diagnosis not present

## 2023-01-29 DIAGNOSIS — M10079 Idiopathic gout, unspecified ankle and foot: Secondary | ICD-10-CM | POA: Diagnosis not present

## 2023-01-29 DIAGNOSIS — M5416 Radiculopathy, lumbar region: Secondary | ICD-10-CM

## 2023-01-29 LAB — COMPREHENSIVE METABOLIC PANEL
ALT: 13 U/L (ref 0–35)
AST: 15 U/L (ref 0–37)
Albumin: 4.5 g/dL (ref 3.5–5.2)
Alkaline Phosphatase: 108 U/L (ref 39–117)
BUN: 10 mg/dL (ref 6–23)
CO2: 30 mEq/L (ref 19–32)
Calcium: 9.8 mg/dL (ref 8.4–10.5)
Chloride: 100 mEq/L (ref 96–112)
Creatinine, Ser: 0.89 mg/dL (ref 0.40–1.20)
GFR: 66.6 mL/min (ref >60.00)
Glucose, Bld: 103 mg/dL — ABNORMAL HIGH (ref 70–99)
Potassium: 4.1 mEq/L (ref 3.5–5.1)
Sodium: 140 mEq/L (ref 135–145)
Total Bilirubin: 0.4 mg/dL (ref 0.2–1.2)
Total Protein: 8.2 g/dL (ref 6.0–8.3)

## 2023-01-29 LAB — HEMOGLOBIN A1C: Hgb A1c MFr Bld: 7.7 % — ABNORMAL HIGH (ref 4.6–6.5)

## 2023-01-29 NOTE — Progress Notes (Unsigned)
Subjective:  Patient ID: Renee Mann, female    DOB: [birth year]  Age: 67 y.o. MRN: [id number]  CC: Medical Management of Chronic Issues (3 month follow up )   HPI Renee Mann presents for DM, HTN, GERD  Possible scammers mailed the pt Dexcom 7, back and knee brace  Outpatient Medications Prior to Visit  Medication Sig Dispense Refill   ACCU-CHEK GUIDE test strip USE TO CHECK BLOOD SUGARS ONCE A DAY 100 strip 3   Accu-Chek Softclix Lancets lancets USE TO CHECK BLOOD SUGAR DAILY 100 each 3   amLODipine (NORVASC) 5 MG tablet TAKE 1 TABLET BY MOUTH EVERY DAY 90 tablet 3   blood glucose meter kit and supplies Dispense based on patient and insurance preference. Use up to four times daily as directed. (FOR ICD-10 E10.9, E11.9). 1 each 0   Blood Glucose Monitoring Suppl (ACCU-CHEK AVIVA PLUS) w/Device KIT Use to check blood sugar daily. DX E11.9 1 kit 0   Cholecalciferol (VITAMIN D3) 50 MCG (2000 UT) capsule Take 1 capsule (2,000 Units total) by mouth daily. (Patient taking differently: Take 5,000 Units by mouth daily.) 100 capsule 3   clotrimazole (LOTRIMIN) 1 % cream APPLY 1 APPLICATION TOPICALLY IN THE MORNING AND AT BEDTIME. USE UNTIL RASH DISAPPEARS AND THEN ANOTHER 3 DAYS. (Patient taking differently: Apply 1 Application topically daily as needed (Itching).) 30 g 3   diphenhydrAMINE (BENADRYL) 25 MG tablet Take 25 mg by mouth every 6 (six) hours as needed for allergies.     hydrocortisone 2.5 % ointment Apply topically twice daily as need to red sandpapery rash. 30 g 3   hyoscyamine (LEVSIN SL) 0.125 MG SL tablet Place 1 tablet (0.125 mg total) under the tongue every 6 (six) hours as needed for cramping (nausea, AB pain). 30 tablet 0   ketoconazole (NIZORAL) 2 % cream Apply 1 Application topically daily as needed (rash). 30 g 0   meloxicam (MOBIC) 15 MG tablet TAKE 1 TABLET BY MOUTH EVERY DAY AS NEEDED FOR PAIN 30 tablet 1   metFORMIN (GLUCOPHAGE-XR) 750 MG 24 hr tablet TAKE 1 TABLET BY  MOUTH ONCE DAILY WITH BREAKFAST 90 tablet 2   OVER THE COUNTER MEDICATION Take 1 tablet by mouth every 6 (six) hours as needed (Feeling in stomach). Altoids     pantoprazole (PROTONIX) 40 MG tablet Take 1 tablet (40 mg total) by mouth 2 (two) times daily. 180 tablet 1   repaglinide (PRANDIN) 1 MG tablet TAKE 1 TABLET BY MOUTH 3 TIMES DAILY BEFORE MEAL(S) 270 tablet 1   triamcinolone cream (KENALOG) 0.1 % APPLY TO AFFECTED AREA TWICE A DAY (Patient taking differently: Apply 1 Application topically 2 (two) times daily as needed (itching).) 30 g 5   zolpidem (AMBIEN) 10 MG tablet Take 0.5-1 tablets (5-10 mg total) by mouth at bedtime as needed for sleep. 30 tablet 3   metFORMIN (GLUCOPHAGE-XR) 500 MG 24 hr tablet Take 1 tablet (500 mg total) by mouth 2 (two) times daily with a meal. 180 tablet 3   Facility-Administered Medications Prior to Visit  Medication Dose Route Frequency Provider Last Rate Last Admin   dupilumab (DUPIXENT) prefilled syringe 300 mg  300 mg Subcutaneous Q14 Days Verlee Monte, MD   300 mg at 01/26/23 0843    ROS: Review of Systems  Constitutional:  Negative for activity change, appetite change, chills, fatigue and unexpected weight change.  HENT:  Negative for congestion, mouth sores and sinus pressure.   Eyes:  Negative for  visual disturbance.  Respiratory:  Negative for cough and chest tightness.   Gastrointestinal:  Negative for abdominal pain and nausea.  Genitourinary:  Negative for difficulty urinating, frequency and vaginal pain.  Musculoskeletal:  Negative for back pain and gait problem.  Skin:  Negative for pallor and rash.  Neurological:  Negative for dizziness, tremors, weakness, numbness and headaches.  Psychiatric/Behavioral:  Negative for confusion and sleep disturbance.     Objective:  BP 124/82 (BP Location: Left Arm, Patient Position: Sitting, Cuff Size: Normal)   Pulse 95   Temp 98.1 F (36.7 C) (Oral)   Ht 4\' 10"  (1.473 m)   Wt 144 lb (65.3 kg)    SpO2 98%   BMI 30.10 kg/m   BP Readings from Last 3 Encounters:  01/29/23 124/82  12/16/22 (!) 146/76  10/20/22 138/86    Wt Readings from Last 3 Encounters:  01/29/23 144 lb (65.3 kg)  12/16/22 143 lb (64.9 kg)  10/20/22 143 lb (64.9 kg)    Physical Exam Constitutional:      General: She is not in acute distress.    Appearance: She is well-developed. She is obese.  HENT:     Head: Normocephalic.     Right Ear: External ear normal.     Left Ear: External ear normal.     Nose: Nose normal.  Eyes:     General:        Right eye: No discharge.        Left eye: No discharge.     Conjunctiva/sclera: Conjunctivae normal.     Pupils: Pupils are equal, round, and reactive to light.  Neck:     Thyroid: No thyromegaly.     Vascular: No JVD.     Trachea: No tracheal deviation.  Cardiovascular:     Rate and Rhythm: Normal rate and regular rhythm.     Heart sounds: Normal heart sounds.  Pulmonary:     Effort: No respiratory distress.     Breath sounds: No stridor. No wheezing.  Abdominal:     General: Bowel sounds are normal. There is no distension.     Palpations: Abdomen is soft. There is no mass.     Tenderness: There is no abdominal tenderness. There is no guarding or rebound.  Musculoskeletal:        General: No tenderness.     Cervical back: Normal range of motion and neck supple. No rigidity.  Lymphadenopathy:     Cervical: No cervical adenopathy.  Skin:    Findings: No erythema or rash.  Neurological:     Mental Status: She is oriented to person, place, and time.     Cranial Nerves: No cranial nerve deficit.     Motor: No abnormal muscle tone.     Coordination: Coordination normal.     Deep Tendon Reflexes: Reflexes normal.  Psychiatric:        Behavior: Behavior normal.        Thought Content: Thought content normal.        Judgment: Judgment normal.     Lab Results  Component Value Date   WBC 6.1 06/09/2022   HGB 12.9 06/09/2022   HCT 39.6 06/09/2022    PLT 438.0 (H) 06/09/2022   GLUCOSE 103 (H) 01/29/2023   CHOL 193 03/10/2022   TRIG 112.0 03/10/2022   HDL 42.40 03/10/2022   LDLCALC 128 (H) 03/10/2022   ALT 13 01/29/2023   AST 15 01/29/2023   NA 140 01/29/2023   K 4.1 01/29/2023  CL 100 01/29/2023   CREATININE 0.89 01/29/2023   BUN 10 01/29/2023   CO2 30 01/29/2023   TSH 1.93 06/09/2022   HGBA1C 7.7 (H) 01/29/2023   MICROALBUR 1.6 09/16/2020    No results found.  Assessment & Plan:   Problem List Items Addressed This Visit     Type 2 diabetes mellitus (HCC) - Primary    Possible scammers mailed the pt Dexcom 7, back and knee brace We can use Mounjaro, Ozempic, Trulicity if affordable Change Metformin XR to smaller tablets      Relevant Orders   Comprehensive metabolic panel (Completed)   Hemoglobin A1c (Completed)   Osteoarthritis    Possible scammers mailed the pt Dexcom 7, back and knee brace      Gout    No relapse      Lumbar radiculopathy, right    Possible scammers mailed the pt Dexcom 7, back and knee brace         No orders of the defined types were placed in this encounter.     Follow-up: Return in about 3 months (around 05/01/2023) for a follow-up visit.  Sonda Primes, MD

## 2023-01-29 NOTE — Assessment & Plan Note (Signed)
Possible scammers mailed the pt Dexcom 7, back and knee brace

## 2023-01-29 NOTE — Assessment & Plan Note (Signed)
Possible scammers mailed the pt Dexcom 7, back and knee brace We can use Mounjaro, Ozempic, Trulicity if affordable Change Metformin XR to smaller tablets

## 2023-02-01 NOTE — Assessment & Plan Note (Signed)
No relapse 

## 2023-02-09 ENCOUNTER — Ambulatory Visit (INDEPENDENT_AMBULATORY_CARE_PROVIDER_SITE_OTHER): Payer: Medicare Other | Admitting: *Deleted

## 2023-02-09 DIAGNOSIS — L209 Atopic dermatitis, unspecified: Secondary | ICD-10-CM

## 2023-02-24 ENCOUNTER — Ambulatory Visit (INDEPENDENT_AMBULATORY_CARE_PROVIDER_SITE_OTHER): Payer: Medicare Other | Admitting: *Deleted

## 2023-02-24 DIAGNOSIS — L209 Atopic dermatitis, unspecified: Secondary | ICD-10-CM | POA: Diagnosis not present

## 2023-03-11 ENCOUNTER — Ambulatory Visit (INDEPENDENT_AMBULATORY_CARE_PROVIDER_SITE_OTHER): Payer: Medicare Other

## 2023-03-11 DIAGNOSIS — L209 Atopic dermatitis, unspecified: Secondary | ICD-10-CM | POA: Diagnosis not present

## 2023-03-25 ENCOUNTER — Ambulatory Visit (INDEPENDENT_AMBULATORY_CARE_PROVIDER_SITE_OTHER): Payer: Medicare Other

## 2023-03-25 DIAGNOSIS — L209 Atopic dermatitis, unspecified: Secondary | ICD-10-CM | POA: Diagnosis not present

## 2023-04-02 ENCOUNTER — Other Ambulatory Visit: Payer: Self-pay | Admitting: Internal Medicine

## 2023-04-02 ENCOUNTER — Other Ambulatory Visit: Payer: Self-pay

## 2023-04-02 DIAGNOSIS — R0789 Other chest pain: Secondary | ICD-10-CM

## 2023-04-02 MED ORDER — HYOSCYAMINE SULFATE 0.125 MG SL SUBL
0.1250 mg | SUBLINGUAL_TABLET | Freq: Four times a day (QID) | SUBLINGUAL | 0 refills | Status: AC | PRN
Start: 2023-04-02 — End: ?

## 2023-04-08 ENCOUNTER — Ambulatory Visit (INDEPENDENT_AMBULATORY_CARE_PROVIDER_SITE_OTHER): Payer: Medicare Other | Admitting: *Deleted

## 2023-04-08 ENCOUNTER — Encounter (INDEPENDENT_AMBULATORY_CARE_PROVIDER_SITE_OTHER): Payer: Self-pay

## 2023-04-08 DIAGNOSIS — L209 Atopic dermatitis, unspecified: Secondary | ICD-10-CM

## 2023-04-22 ENCOUNTER — Ambulatory Visit (INDEPENDENT_AMBULATORY_CARE_PROVIDER_SITE_OTHER): Payer: Medicare Other

## 2023-04-22 DIAGNOSIS — L209 Atopic dermatitis, unspecified: Secondary | ICD-10-CM | POA: Diagnosis not present

## 2023-05-03 ENCOUNTER — Encounter: Payer: Self-pay | Admitting: Internal Medicine

## 2023-05-03 ENCOUNTER — Ambulatory Visit (INDEPENDENT_AMBULATORY_CARE_PROVIDER_SITE_OTHER): Payer: Medicare Other | Admitting: Internal Medicine

## 2023-05-03 VITALS — BP 130/70 | HR 70 | Temp 98.6°F | Ht <= 58 in | Wt 145.0 lb

## 2023-05-03 DIAGNOSIS — Z7984 Long term (current) use of oral hypoglycemic drugs: Secondary | ICD-10-CM | POA: Diagnosis not present

## 2023-05-03 DIAGNOSIS — M1990 Unspecified osteoarthritis, unspecified site: Secondary | ICD-10-CM

## 2023-05-03 DIAGNOSIS — E785 Hyperlipidemia, unspecified: Secondary | ICD-10-CM | POA: Diagnosis not present

## 2023-05-03 DIAGNOSIS — R5383 Other fatigue: Secondary | ICD-10-CM

## 2023-05-03 DIAGNOSIS — E119 Type 2 diabetes mellitus without complications: Secondary | ICD-10-CM | POA: Diagnosis not present

## 2023-05-03 LAB — COMPREHENSIVE METABOLIC PANEL
ALT: 31 U/L (ref 0–35)
AST: 20 U/L (ref 0–37)
Albumin: 4.3 g/dL (ref 3.5–5.2)
Alkaline Phosphatase: 103 U/L (ref 39–117)
BUN: 10 mg/dL (ref 6–23)
CO2: 30 meq/L (ref 19–32)
Calcium: 9.8 mg/dL (ref 8.4–10.5)
Chloride: 102 meq/L (ref 96–112)
Creatinine, Ser: 0.78 mg/dL (ref 0.40–1.20)
GFR: 77.89 mL/min (ref >60.00)
Glucose, Bld: 112 mg/dL — ABNORMAL HIGH (ref 70–99)
Potassium: 3.7 meq/L (ref 3.5–5.1)
Sodium: 142 meq/L (ref 135–145)
Total Bilirubin: 0.4 mg/dL (ref 0.2–1.2)
Total Protein: 7.9 g/dL (ref 6.0–8.3)

## 2023-05-03 LAB — CBC WITH DIFFERENTIAL/PLATELET
Basophils Absolute: 0.1 10*3/uL (ref 0.0–0.1)
Basophils Relative: 0.9 % (ref 0.0–3.0)
Eosinophils Absolute: 0.1 10*3/uL (ref 0.0–0.7)
Eosinophils Relative: 2.1 % (ref 0.0–5.0)
HCT: 40.2 % (ref 36.0–46.0)
Hemoglobin: 13 g/dL (ref 12.0–15.0)
Lymphocytes Relative: 44 % (ref 12.0–46.0)
Lymphs Abs: 2.7 10*3/uL (ref 0.7–4.0)
MCHC: 32.2 g/dL (ref 30.0–36.0)
MCV: 89.5 fl (ref 78.0–100.0)
Monocytes Absolute: 0.4 10*3/uL (ref 0.1–1.0)
Monocytes Relative: 6.9 % (ref 3.0–12.0)
Neutro Abs: 2.9 10*3/uL (ref 1.4–7.7)
Neutrophils Relative %: 46.1 % (ref 43.0–77.0)
Platelets: 442 10*3/uL — ABNORMAL HIGH (ref 150.0–400.0)
RBC: 4.49 Mil/uL (ref 3.87–5.11)
RDW: 14.2 % (ref 11.5–15.5)
WBC: 6.2 10*3/uL (ref 4.0–10.5)

## 2023-05-03 LAB — URINALYSIS, ROUTINE W REFLEX MICROSCOPIC
Bilirubin Urine: NEGATIVE
Hgb urine dipstick: NEGATIVE
Ketones, ur: NEGATIVE
Nitrite: NEGATIVE
RBC / HPF: NONE SEEN (ref 0–?)
Specific Gravity, Urine: 1.02 (ref 1.000–1.030)
Total Protein, Urine: NEGATIVE
Urine Glucose: NEGATIVE
Urobilinogen, UA: 0.2 (ref 0.0–1.0)
pH: 6 (ref 5.0–8.0)

## 2023-05-03 LAB — LIPID PANEL
Cholesterol: 170 mg/dL (ref 0–200)
HDL: 46.1 mg/dL (ref >39.00)
LDL Cholesterol: 110 mg/dL — ABNORMAL HIGH (ref 0–99)
NonHDL: 123.79
Total CHOL/HDL Ratio: 4
Triglycerides: 69 mg/dL (ref 0.0–149.0)
VLDL: 13.8 mg/dL (ref 0.0–40.0)

## 2023-05-03 LAB — MICROALBUMIN / CREATININE URINE RATIO
Creatinine,U: 81.6 mg/dL
Microalb Creat Ratio: 0.9 mg/g (ref 0.0–30.0)
Microalb, Ur: <0.7 mg/dL (ref 0.0–1.9)

## 2023-05-03 LAB — HEMOGLOBIN A1C: Hgb A1c MFr Bld: 7.6 % — ABNORMAL HIGH (ref 4.6–6.5)

## 2023-05-03 LAB — TSH: TSH: 1.91 u[IU]/mL (ref 0.35–5.50)

## 2023-05-03 NOTE — Assessment & Plan Note (Addendum)
  We can use Mounjaro, Ozempic, Trulicity if affordable  Metformin XR ok on  smaller tablets Labs

## 2023-05-03 NOTE — Progress Notes (Signed)
Subjective:  Patient ID: Renee Mann, female    DOB: 1955-10-12  Age: 67 y.o. MRN: [id number]  CC: Follow-up (3 mnth f/u knee pain/cramps radiating into calves.)   HPI Renee Mann presents for leg cramps and OA x months; more like arthralgia  On Meloxicam  Outpatient Medications Prior to Visit  Medication Sig Dispense Refill   ACCU-CHEK GUIDE test strip USE TO CHECK BLOOD SUGARS ONCE A DAY 100 strip 3   Accu-Chek Softclix Lancets lancets USE TO CHECK BLOOD SUGAR DAILY 100 each 3   amLODipine (NORVASC) 5 MG tablet TAKE 1 TABLET BY MOUTH EVERY DAY 90 tablet 3   blood glucose meter kit and supplies Dispense based on patient and insurance preference. Use up to four times daily as directed. (FOR ICD-10 E10.9, E11.9). 1 each 0   Blood Glucose Monitoring Suppl (ACCU-CHEK AVIVA PLUS) w/Device KIT Use to check blood sugar daily. DX E11.9 1 kit 0   Cholecalciferol (VITAMIN D3) 50 MCG (2000 UT) capsule Take 1 capsule (2,000 Units total) by mouth daily. (Patient taking differently: Take 5,000 Units by mouth daily.) 100 capsule 3   clotrimazole (LOTRIMIN) 1 % cream APPLY 1 APPLICATION TOPICALLY IN THE MORNING AND AT BEDTIME. USE UNTIL RASH DISAPPEARS AND THEN ANOTHER 3 DAYS. (Patient taking differently: Apply 1 Application topically daily as needed (Itching).) 30 g 3   diphenhydrAMINE (BENADRYL) 25 MG tablet Take 25 mg by mouth every 6 (six) hours as needed for allergies.     hydrocortisone 2.5 % ointment Apply topically twice daily as need to red sandpapery rash. 30 g 3   hyoscyamine (LEVSIN SL) 0.125 MG SL tablet Place 1 tablet (0.125 mg total) under the tongue every 6 (six) hours as needed for cramping (nausea, AB pain). 30 tablet 0   ketoconazole (NIZORAL) 2 % cream Apply 1 Application topically daily as needed (rash). 30 g 0   meloxicam (MOBIC) 15 MG tablet TAKE 1 TABLET BY MOUTH EVERY DAY AS NEEDED FOR PAIN 30 tablet 1   metFORMIN (GLUCOPHAGE-XR) 750 MG 24 hr tablet TAKE 1 TABLET BY MOUTH ONCE  DAILY WITH BREAKFAST 90 tablet 2   OVER THE COUNTER MEDICATION Take 1 tablet by mouth every 6 (six) hours as needed (Feeling in stomach). Altoids     pantoprazole (PROTONIX) 40 MG tablet Take 1 tablet (40 mg total) by mouth 2 (two) times daily. 180 tablet 1   repaglinide (PRANDIN) 1 MG tablet TAKE 1 TABLET BY MOUTH 3 TIMES DAILY BEFORE MEAL(S) 270 tablet 1   triamcinolone cream (KENALOG) 0.1 % APPLY TO AFFECTED AREA TWICE A DAY 30 g 5   zolpidem (AMBIEN) 10 MG tablet Take 0.5-1 tablets (5-10 mg total) by mouth at bedtime as needed for sleep. 30 tablet 3   Facility-Administered Medications Prior to Visit  Medication Dose Route Frequency Provider Last Rate Last Admin   dupilumab (DUPIXENT) prefilled syringe 300 mg  300 mg Subcutaneous Q14 Days Verlee Monte, MD   300 mg at 04/22/23 0830    ROS: Review of Systems  Constitutional:  Negative for activity change, appetite change, chills, fatigue and unexpected weight change.  HENT:  Negative for congestion, mouth sores and sinus pressure.   Eyes:  Negative for visual disturbance.  Respiratory:  Negative for cough and chest tightness.   Gastrointestinal:  Negative for abdominal pain and nausea.  Genitourinary:  Negative for difficulty urinating, frequency and vaginal pain.  Musculoskeletal:  Positive for arthralgias. Negative for back pain and gait problem.  Skin:  Negative for pallor and rash.  Neurological:  Negative for dizziness, tremors, weakness, numbness and headaches.  Psychiatric/Behavioral:  Negative for confusion and sleep disturbance.     Objective:  BP 130/70 (BP Location: Left Arm, Patient Position: Sitting, Cuff Size: Normal)   Pulse 70   Temp 98.6 F (37 C) (Oral)   Ht 4\' 10"  (1.473 m)   Wt 145 lb (65.8 kg)   SpO2 97%   BMI 30.31 kg/m   BP Readings from Last 3 Encounters:  05/03/23 130/70  01/29/23 124/82  12/16/22 (!) 146/76    Wt Readings from Last 3 Encounters:  05/03/23 145 lb (65.8 kg)  01/29/23 144 lb  (65.3 kg)  12/16/22 143 lb (64.9 kg)    Physical Exam Constitutional:      General: She is not in acute distress.    Appearance: She is well-developed. She is obese.  HENT:     Head: Normocephalic.     Right Ear: External ear normal.     Left Ear: External ear normal.     Nose: Nose normal.  Eyes:     General:        Right eye: No discharge.        Left eye: No discharge.     Conjunctiva/sclera: Conjunctivae normal.     Pupils: Pupils are equal, round, and reactive to light.  Neck:     Thyroid: No thyromegaly.     Vascular: No JVD.     Trachea: No tracheal deviation.  Cardiovascular:     Rate and Rhythm: Normal rate and regular rhythm.     Heart sounds: Normal heart sounds.  Pulmonary:     Effort: No respiratory distress.     Breath sounds: No stridor. No wheezing.  Abdominal:     General: Bowel sounds are normal. There is no distension.     Palpations: Abdomen is soft. There is no mass.     Tenderness: There is no abdominal tenderness. There is no guarding or rebound.  Musculoskeletal:        General: No tenderness.     Cervical back: Normal range of motion and neck supple. No rigidity.  Lymphadenopathy:     Cervical: No cervical adenopathy.  Skin:    Findings: No erythema or rash.  Neurological:     Cranial Nerves: No cranial nerve deficit.     Motor: No abnormal muscle tone.     Coordination: Coordination normal.     Deep Tendon Reflexes: Reflexes normal.  Psychiatric:        Behavior: Behavior normal.        Thought Content: Thought content normal.        Judgment: Judgment normal.     Lab Results  Component Value Date   WBC 6.1 06/09/2022   HGB 12.9 06/09/2022   HCT 39.6 06/09/2022   PLT 438.0 (H) 06/09/2022   GLUCOSE 103 (H) 01/29/2023   CHOL 193 03/10/2022   TRIG 112.0 03/10/2022   HDL 42.40 03/10/2022   LDLCALC 128 (H) 03/10/2022   ALT 13 01/29/2023   AST 15 01/29/2023   NA 140 01/29/2023   K 4.1 01/29/2023   CL 100 01/29/2023   CREATININE  0.89 01/29/2023   BUN 10 01/29/2023   CO2 30 01/29/2023   TSH 1.93 06/09/2022   HGBA1C 7.7 (H) 01/29/2023   MICROALBUR 1.6 09/16/2020    No results found.  Assessment & Plan:   Problem List Items Addressed This Visit  Type 2 diabetes mellitus (HCC)     We can use Mounjaro, Ozempic, Trulicity if affordable  Metformin XR ok on  smaller tablets Labs      Relevant Orders   CBC with Differential/Platelet   Comprehensive metabolic panel   Urinalysis   Microalbumin / creatinine urine ratio   Hemoglobin A1c   Osteoarthritis - Primary    On Meloxicam prn  Potential benefits of a long term NSAID use as well as potential risks  and complications were explained to the patient and were aknowledged.        Relevant Orders   Comprehensive metabolic panel   Urinalysis   Microalbumin / creatinine urine ratio   Hemoglobin A1c   Fatigue    Better      Relevant Orders   TSH   Other Visit Diagnoses     Dyslipidemia       Relevant Orders   TSH   Lipid panel         No orders of the defined types were placed in this encounter.     Follow-up: Return in about 3 months (around 08/02/2023) for a follow-up visit.  Sonda Primes, MD

## 2023-05-03 NOTE — Assessment & Plan Note (Signed)
Better  

## 2023-05-03 NOTE — Assessment & Plan Note (Signed)
On Meloxicam prn  Potential benefits of a long term NSAID use as well as potential risks  and complications were explained to the patient and were aknowledged.

## 2023-05-06 ENCOUNTER — Ambulatory Visit: Payer: Medicare Other

## 2023-05-17 ENCOUNTER — Ambulatory Visit (INDEPENDENT_AMBULATORY_CARE_PROVIDER_SITE_OTHER): Payer: Medicare Other

## 2023-05-17 DIAGNOSIS — L209 Atopic dermatitis, unspecified: Secondary | ICD-10-CM

## 2023-05-31 ENCOUNTER — Ambulatory Visit (INDEPENDENT_AMBULATORY_CARE_PROVIDER_SITE_OTHER): Payer: Medicare Other

## 2023-05-31 DIAGNOSIS — L209 Atopic dermatitis, unspecified: Secondary | ICD-10-CM

## 2023-06-11 ENCOUNTER — Ambulatory Visit (INDEPENDENT_AMBULATORY_CARE_PROVIDER_SITE_OTHER): Payer: Medicare Other

## 2023-06-11 ENCOUNTER — Other Ambulatory Visit: Payer: Self-pay | Admitting: Nurse Practitioner

## 2023-06-11 VITALS — Ht <= 58 in | Wt 145.0 lb

## 2023-06-11 DIAGNOSIS — Z0000 Encounter for general adult medical examination without abnormal findings: Secondary | ICD-10-CM | POA: Diagnosis not present

## 2023-06-11 DIAGNOSIS — Z1239 Encounter for other screening for malignant neoplasm of breast: Secondary | ICD-10-CM

## 2023-06-11 DIAGNOSIS — E119 Type 2 diabetes mellitus without complications: Secondary | ICD-10-CM

## 2023-06-11 MED ORDER — BLOOD GLUCOSE TEST VI STRP
1.0000 | ORAL_STRIP | Freq: Three times a day (TID) | 11 refills | Status: DC
Start: 2023-06-11 — End: 2023-12-28

## 2023-06-11 MED ORDER — LANCETS MISC. MISC
1.0000 | Freq: Three times a day (TID) | 11 refills | Status: AC
Start: 2023-06-11 — End: 2023-07-11

## 2023-06-11 MED ORDER — LANCET DEVICE MISC
1.0000 | Freq: Three times a day (TID) | 0 refills | Status: AC
Start: 2023-06-11 — End: 2023-07-11

## 2023-06-11 MED ORDER — ACCU-CHEK AVIVA PLUS W/DEVICE KIT
PACK | 0 refills | Status: DC
Start: 2023-06-11 — End: 2024-03-28

## 2023-06-11 MED ORDER — BLOOD GLUCOSE MONITORING SUPPL DEVI
1.0000 | Freq: Three times a day (TID) | 0 refills | Status: DC
Start: 2023-06-11 — End: 2023-12-28

## 2023-06-11 NOTE — Progress Notes (Signed)
Subjective:   Renee Mann is a 67 y.o. female who presents for Medicare Annual (Subsequent) preventive examination.  Visit Complete: Virtual I connected with  Renee Mann on 06/11/23 by a audio enabled telemedicine application and verified that I am speaking with the correct person using two identifiers.  Patient Location: Home  Provider Location: Home Office  I discussed the limitations of evaluation and management by telemedicine. The patient expressed understanding and agreed to proceed.  Vital Signs: Because this visit was a virtual/telehealth visit, some criteria may be missing or patient reported. Any vitals not documented were not able to be obtained and vitals that have been documented are patient reported.  Patient Medicare AWV questionnaire was completed by the patient on 06/07/2023; I have confirmed that all information answered by patient is correct and no changes since this date.  Cardiac Risk Factors include: advanced age (>26men, >1 women);hypertension;diabetes mellitus     Objective:    Today's Vitals   06/11/23 0814  Weight: 145 lb (65.8 kg)  Height: 4\' 10"  (1.473 m)   Body mass index is 30.31 kg/m.     06/11/2023    8:24 AM 12/16/2022    8:05 AM 06/08/2022    8:26 AM 12/25/2021    8:24 PM 10/27/2019    4:23 PM 12/05/2016    5:29 PM 06/15/2016   10:24 AM  Advanced Directives  Does Patient Have a Medical Advance Directive? No No No No No No No  Would patient like information on creating a medical advance directive?   No - Patient declined        Current Medications (verified) Outpatient Encounter Medications as of 06/11/2023  Medication Sig   ACCU-CHEK GUIDE test strip USE TO CHECK BLOOD SUGARS ONCE A DAY   Accu-Chek Softclix Lancets lancets USE TO CHECK BLOOD SUGAR DAILY   amLODipine (NORVASC) 5 MG tablet TAKE 1 TABLET BY MOUTH EVERY DAY   blood glucose meter kit and supplies Dispense based on patient and insurance preference. Use up to four times  daily as directed. (FOR ICD-10 E10.9, E11.9).   Blood Glucose Monitoring Suppl (ACCU-CHEK AVIVA PLUS) w/Device KIT Use to check blood sugar daily. DX E11.9   Cholecalciferol (VITAMIN D3) 50 MCG (2000 UT) capsule Take 1 capsule (2,000 Units total) by mouth daily. (Patient taking differently: Take 5,000 Units by mouth daily.)   clotrimazole (LOTRIMIN) 1 % cream APPLY 1 APPLICATION TOPICALLY IN THE MORNING AND AT BEDTIME. USE UNTIL RASH DISAPPEARS AND THEN ANOTHER 3 DAYS. (Patient taking differently: Apply 1 Application topically daily as needed (Itching).)   diphenhydrAMINE (BENADRYL) 25 MG tablet Take 25 mg by mouth every 6 (six) hours as needed for allergies.   hydrocortisone 2.5 % ointment Apply topically twice daily as need to red sandpapery rash.   hyoscyamine (LEVSIN SL) 0.125 MG SL tablet Place 1 tablet (0.125 mg total) under the tongue every 6 (six) hours as needed for cramping (nausea, AB pain).   ketoconazole (NIZORAL) 2 % cream Apply 1 Application topically daily as needed (rash).   meloxicam (MOBIC) 15 MG tablet TAKE 1 TABLET BY MOUTH EVERY DAY AS NEEDED FOR PAIN   metFORMIN (GLUCOPHAGE-XR) 750 MG 24 hr tablet TAKE 1 TABLET BY MOUTH ONCE DAILY WITH BREAKFAST   OVER THE COUNTER MEDICATION Take 1 tablet by mouth every 6 (six) hours as needed (Feeling in stomach). Altoids   pantoprazole (PROTONIX) 40 MG tablet Take 1 tablet (40 mg total) by mouth 2 (two) times daily.  repaglinide (PRANDIN) 1 MG tablet TAKE 1 TABLET BY MOUTH 3 TIMES DAILY BEFORE MEAL(S)   triamcinolone cream (KENALOG) 0.1 % APPLY TO AFFECTED AREA TWICE A DAY   zolpidem (AMBIEN) 10 MG tablet Take 0.5-1 tablets (5-10 mg total) by mouth at bedtime as needed for sleep.   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   URI (upper respiratory infection) 11/28/2009   Past Surgical History:  Procedure Laterality Date   ABDOMINAL HYSTERECTOMY  1987   BIOPSY  12/16/2022   Procedure: BIOPSY;  Surgeon: Lemar Lofty., MD;  Location: [facility];  Service: Gastroenterology;;   burritis surgery Left 1998   hip   CATARACT EXTRACTION Bilateral 12/18/2022   ESOPHAGOGASTRODUODENOSCOPY (EGD) WITH PROPOFOL N/A 12/16/2022   Procedure: ESOPHAGOGASTRODUODENOSCOPY (EGD) WITH PROPOFOL;  Surgeon: Lemar Lofty., MD;  Location: [facility];  Service: Gastroenterology;  Laterality: N/A;   EUS N/A 12/16/2022   Procedure: UPPER ENDOSCOPIC ULTRASOUND (EUS) RADIAL;  Surgeon: Lemar Lofty., MD;  Location: [facility];  Service: Gastroenterology;  Laterality: N/A;   Family History  Problem Relation Age of Onset   Hypertension Mother    Cancer Mother    Stroke Mother    Hypertension Sister    Diabetes Sister    Hypertension Other    Colon cancer Neg Hx    Esophageal cancer Neg Hx    Stomach cancer Neg Hx    Rectal cancer Neg Hx    Social History   Socioeconomic History   Marital status: Married    Spouse name: Renee Mann   Number of children: 1   Years of education: Not on file   Highest education level: 12th grade  Occupational History   Occupation: housewife    Comment: staying 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   Occupation: Retired  Tobacco Use   Smoking status: Former    Current packs/day: 0.00    Types: Cigarettes    Quit date: 08/24/1998    Years since quitting: 24.8   Smokeless tobacco: Never  Vaping Use   Vaping status: Never Used  Substance and Sexual Activity   Alcohol use: No    Alcohol/week: 0.0 standard drinks of alcohol   Drug use: No   Sexual activity: Yes  Other Topics Concern   Not on file  Social History  Narrative   ** Merged History Encounter       Lives with husband.**    Social Determinants of Health   Financial Resource Strain: Low Risk  (06/07/2023)   Overall Financial Resource Strain (CARDIA)    Difficulty of Paying Living Expenses: Not hard at all  Food Insecurity: No Food Insecurity (06/07/2023)   Hunger Vital Sign    Worried About Running Out of Food in the Last Year: Never true    Ran Out of Food in the Last Year: Never true  Transportation Needs: No Transportation Needs (06/07/2023)   PRAPARE - Administrator, Civil Service (Medical): No    Lack of Transportation (Non-Medical): No  Physical Activity: Insufficiently Active (06/07/2023)   Exercise Vital Sign    Days  of Exercise per Week: 3 days    Minutes of Exercise per Session: 30 min  Stress: No Stress Concern Present (06/11/2023)   [employer] of Occupational Health - Occupational Stress Questionnaire    Feeling of Stress : Not at all  Social Connections: Unknown (06/07/2023)   Social Connection and Isolation Panel [NHANES]    Frequency of Communication with Friends and Family: More than three times a week    Frequency of Social Gatherings with Friends and Family: Twice a week    Attends Religious Services: Patient declined    Database administrator or Organizations: No    Attends Engineer, structural: Never    Marital Status: Married    Tobacco Counseling Counseling given: Not Answered   Clinical Intake:  Pre-visit preparation completed: Yes  Pain : No/denies pain     BMI - recorded: 30.31 Nutritional Status: BMI > 30  Obese Nutritional Risks: None Diabetes: Yes CBG done?: No (109) Did pt. bring in CBG monitor from home?: No  How often do you need to have someone help you when you read instructions, pamphlets, or other written materials from your doctor or pharmacy?: 1 - Never  Interpreter Needed?: No  Information entered by :: Rasheen Schewe, RMA   Activities of  Daily Living    06/07/2023    9:25 PM  In your present state of health, do you have any difficulty performing the following activities:  Hearing? 0  Vision? 0  Difficulty concentrating or making decisions? 0  Walking or climbing stairs? 1  Dressing or bathing? 0  Doing errands, shopping? 0  Preparing Food and eating ? N  Using the Toilet? N  In the past six months, have you accidently leaked urine? Y  Do you have problems with loss of bowel control? N  Managing your Medications? N  Managing your Finances? N  Housekeeping or managing your Housekeeping? N    Patient Care Team: Plotnikov, Georgina Quint, MD as PCP - Merleen Milliner, MD (Inactive) (Orthopedic Surgery) Ilda Mori, MD (Obstetrics and Gynecology) Lucie Leather Alvira Philips, MD as Consulting Physician (Allergy and Immunology)  Indicate any recent Medical Services you may have received from other than [facility] providers in the past year (date may be approximate).     Assessment:   This is a routine wellness examination for Charlita.  Hearing/Vision screen Hearing Screening - Comments:: Denies hearing difficulties   Vision Screening - Comments:: Wears eyeglasses/had cataract surgery   Goals Addressed               This Visit's Progress     Patient Stated (pt-stated)        Continue to stay active      Depression Screen    06/11/2023    8:26 AM 05/03/2023    8:22 AM 01/29/2023    8:29 AM 10/20/2022    7:53 AM 07/21/2022    8:12 AM 06/09/2022    7:59 AM 06/08/2022    8:24 AM  PHQ 2/9 Scores  PHQ - 2 Score 0 0 0 1 0 1 0  PHQ- 9 Score 2    0 7     Fall Risk    06/07/2023    9:25 PM 05/03/2023    8:22 AM 01/29/2023    8:29 AM 10/20/2022    7:53 AM 07/21/2022    8:12 AM  Fall Risk   Falls in the past year? 0 0 0 0 0  Number falls in past yr:  0 0 0 0  Injury with Fall?  0 0 0 0  Risk for fall due to :  No Fall Risks No Fall Risks No Fall Risks No Fall Risks  Follow up Falls evaluation completed;Falls  prevention discussed Falls evaluation completed Falls evaluation completed Falls evaluation completed Falls evaluation completed    MEDICARE RISK AT HOME: Medicare Risk at Home Any stairs in or around the home?: No Home free of loose throw rugs in walkways, pet beds, electrical cords, etc?: Yes Adequate lighting in your home to reduce risk of falls?: Yes Life alert?: No Use of a cane, walker or w/c?: No Grab bars in the bathroom?: No Shower chair or bench in shower?: Yes Elevated toilet seat or a handicapped toilet?: No  TIMED UP AND GO:  Was the test performed?  No    Cognitive Function:        06/11/2023    8:24 AM 06/08/2022    8:27 AM  6CIT Screen  What Year? 0 points 0 points  What month? 0 points 0 points  What time? 0 points 0 points  Count back from 20 0 points 0 points  Months in reverse 4 points 0 points  Repeat phrase 0 points 0 points  Total Score 4 points 0 points    Immunizations Immunization History  Administered Date(s) Administered   PFIZER Comirnaty(Gray Top)Covid-19 Tri-Sucrose Vaccine 11/04/2019, 11/25/2019, 07/19/2020   PFIZER(Purple Top)SARS-COV-2 Vaccination 03/04/2021   Pfizer Covid-19 Vaccine Bivalent Booster 52yrs & up 06/03/2021   Pfizer(Comirnaty)Fall Seasonal Vaccine 12 years and older 06/19/2022   Pneumococcal Conjugate-13 07/13/2019   Pneumococcal Polysaccharide-23 09/16/2020   Tdap 03/20/2015   Zoster Recombinant(Shingrix) 03/12/2022, 06/19/2022    TDAP status: Up to date  Flu Vaccine status: Declined, Education has been provided regarding the importance of this vaccine but patient still declined. Advised may receive this vaccine at local pharmacy or Health Dept. Aware to provide a copy of the vaccination record if obtained from local pharmacy or Health Dept. Verbalized acceptance and understanding.  Pneumococcal vaccine status: Up to date  Covid-19 vaccine status: Information provided on how to obtain vaccines.   Qualifies for  Shingles Vaccine? Yes   Zostavax completed Yes   Shingrix Completed?: Yes  Screening Tests Health Maintenance  Topic Date Due   MAMMOGRAM  08/30/2021   FOOT EXAM  09/16/2021   HEMOGLOBIN A1C  10/31/2023   OPHTHALMOLOGY EXAM  01/11/2024   Diabetic kidney evaluation - eGFR measurement  05/02/2024   Diabetic kidney evaluation - Urine ACR  05/02/2024   Medicare Annual Wellness (AWV)  06/10/2024   DTaP/Tdap/Td (2 - Td or Tdap) 03/19/2025   Colonoscopy  06/29/2026   Pneumonia Vaccine 22+ Years old  Completed   DEXA SCAN  Completed   Hepatitis C Screening  Completed   HPV VACCINES  Aged Out   INFLUENZA VACCINE  Discontinued   COVID-19 Vaccine  Discontinued   Zoster Vaccines- Shingrix  Discontinued    Health Maintenance  Health Maintenance Due  Topic Date Due   MAMMOGRAM  08/30/2021   FOOT EXAM  09/16/2021    Colorectal cancer screening: Type of screening: Colonoscopy. Completed 06/29/2016. Repeat every 10 years  Mammogram status: Completed 08/30/2020. Repeat every year  Bone Density status: Completed 09/03/2022. Results reflect: Bone density results: NORMAL. Repeat every 2 years.  Lung Cancer Screening: (Low Dose CT Chest recommended if Age 64-80 years, 20 pack-year currently smoking OR have quit w/in 15years.) does not qualify.   Lung Cancer Screening Referral:  N/A  Additional Screening:  Hepatitis C Screening: does qualify; Completed 04/17/2016  Vision Screening: Recommended annual ophthalmology exams for early detection of glaucoma and other disorders of the eye. Is the patient up to date with their annual eye exam?  Yes  Who is the provider or what is the name of the office in which the patient attends annual eye exams? [facility] ([facility] ) If pt is not established with a provider, would they like to be referred to a provider to establish care? No .   Dental Screening: Recommended annual dental exams for proper oral hygiene  Diabetic Foot Exam: Diabetic Foot Exam:  Overdue, Pt has been advised about the importance in completing this exam. Pt is scheduled for diabetic foot exam on 08/02/2023.  Community Resource Referral / Chronic Care Management: CRR required this visit?  No   CCM required this visit?  No     Plan:     I have personally reviewed and noted the following in the patient's chart:   Medical and social history Use of alcohol, tobacco or illicit drugs  Current medications and supplements including opioid prescriptions. Patient is not currently taking opioid prescriptions. Functional ability and status Nutritional status Physical activity Advanced directives List of other physicians Hospitalizations, surgeries, and ER visits in previous 12 months Vitals Screenings to include cognitive, depression, and falls Referrals and appointments  In addition, I have reviewed and discussed with patient certain preventive protocols, quality metrics, and best practice recommendations. A written personalized care plan for preventive services as well as general preventive health recommendations were provided to patient.     Mario Voong L Craig Wisnewski, CMA   06/11/2023   After Visit Summary: (MyChart) Due to this being a telephonic visit, the after visit summary with patients personalized plan was offered to patient via MyChart   Nurse Notes: Patient decline the Flu and Covid vaccines.  She stated that she is due for a mammogram soon.  She is up to date on all other health maintenance.  Patient is requesting a new Accu-check device kit (meter) to be sent to her pharmacy.  She had no other concerns to address today.

## 2023-06-11 NOTE — Patient Instructions (Signed)
Ms. Devany , Thank you for taking time to come for your Medicare Wellness Visit. I appreciate your ongoing commitment to your health goals. Please review the following plan we discussed and let me know if I can assist you in the future.   Referrals/Orders/Follow-Ups/Clinician Recommendations: Keep up the good work.  This is a list of the screening recommended for you and due dates:  Health Maintenance  Topic Date Due   Mammogram  07/03/2021   Complete foot exam   09/16/2021   Hemoglobin A1C  10/31/2023   Eye exam for diabetics  01/11/2024   Yearly kidney function blood test for diabetes  05/02/2024   Yearly kidney health urinalysis for diabetes  05/02/2024   Medicare Annual Wellness Visit  06/10/2024   DTaP/Tdap/Td vaccine (2 - Td or Tdap) 03/19/2025   Colon Cancer Screening  06/29/2026   Pneumonia Vaccine  Completed   DEXA scan (bone density measurement)  Completed   Hepatitis C Screening  Completed   HPV Vaccine  Aged Out   Flu Shot  Discontinued   COVID-19 Vaccine  Discontinued   Zoster (Shingles) Vaccine  Discontinued    Advanced directives: (Copy Requested) Please bring a copy of your health care power of attorney and living will to the office to be added to your chart at your convenience.  Next Medicare Annual Wellness Visit scheduled for next year: Yes

## 2023-06-15 ENCOUNTER — Other Ambulatory Visit: Payer: Self-pay | Admitting: Internal Medicine

## 2023-06-15 NOTE — Telephone Encounter (Signed)
Patient wants to know why this was refused - she needs the test strips - Accu-check - please call patient and let her know

## 2023-06-16 ENCOUNTER — Other Ambulatory Visit: Payer: Self-pay | Admitting: *Deleted

## 2023-06-16 ENCOUNTER — Ambulatory Visit: Payer: Medicare Other | Admitting: *Deleted

## 2023-06-16 DIAGNOSIS — L209 Atopic dermatitis, unspecified: Secondary | ICD-10-CM

## 2023-06-16 MED ORDER — DUPIXENT 300 MG/2ML ~~LOC~~ SOSY: 300.0000 mg | PREFILLED_SYRINGE | SUBCUTANEOUS | 3 refills | Status: DC

## 2023-06-17 ENCOUNTER — Other Ambulatory Visit: Payer: Self-pay | Admitting: Internal Medicine

## 2023-06-19 DIAGNOSIS — Z23 Encounter for immunization: Secondary | ICD-10-CM | POA: Diagnosis not present

## 2023-07-01 ENCOUNTER — Ambulatory Visit (INDEPENDENT_AMBULATORY_CARE_PROVIDER_SITE_OTHER): Payer: Medicare Other

## 2023-07-01 DIAGNOSIS — L209 Atopic dermatitis, unspecified: Secondary | ICD-10-CM

## 2023-07-15 ENCOUNTER — Ambulatory Visit: Payer: Medicare Other

## 2023-07-15 DIAGNOSIS — L209 Atopic dermatitis, unspecified: Secondary | ICD-10-CM

## 2023-07-18 ENCOUNTER — Other Ambulatory Visit: Payer: Self-pay | Admitting: Internal Medicine

## 2023-07-18 DIAGNOSIS — E119 Type 2 diabetes mellitus without complications: Secondary | ICD-10-CM

## 2023-07-29 ENCOUNTER — Ambulatory Visit: Payer: Medicare Other | Admitting: *Deleted

## 2023-07-29 DIAGNOSIS — L209 Atopic dermatitis, unspecified: Secondary | ICD-10-CM | POA: Diagnosis not present

## 2023-08-02 ENCOUNTER — Encounter: Payer: Self-pay | Admitting: Internal Medicine

## 2023-08-02 ENCOUNTER — Ambulatory Visit (INDEPENDENT_AMBULATORY_CARE_PROVIDER_SITE_OTHER): Payer: Medicare Other | Admitting: Internal Medicine

## 2023-08-02 VITALS — BP 142/94 | HR 90 | Temp 97.8°F | Ht <= 58 in | Wt 143.0 lb

## 2023-08-02 DIAGNOSIS — E119 Type 2 diabetes mellitus without complications: Secondary | ICD-10-CM | POA: Diagnosis not present

## 2023-08-02 DIAGNOSIS — M25552 Pain in left hip: Secondary | ICD-10-CM

## 2023-08-02 DIAGNOSIS — G4700 Insomnia, unspecified: Secondary | ICD-10-CM | POA: Diagnosis not present

## 2023-08-02 DIAGNOSIS — R5383 Other fatigue: Secondary | ICD-10-CM | POA: Diagnosis not present

## 2023-08-02 DIAGNOSIS — Z7984 Long term (current) use of oral hypoglycemic drugs: Secondary | ICD-10-CM | POA: Diagnosis not present

## 2023-08-02 DIAGNOSIS — M25551 Pain in right hip: Secondary | ICD-10-CM | POA: Diagnosis not present

## 2023-08-02 DIAGNOSIS — Z7985 Long-term (current) use of injectable non-insulin antidiabetic drugs: Secondary | ICD-10-CM

## 2023-08-02 LAB — COMPREHENSIVE METABOLIC PANEL
ALT: 12 U/L (ref 0–35)
AST: 15 U/L (ref 0–37)
Albumin: 4.4 g/dL (ref 3.5–5.2)
Alkaline Phosphatase: 99 U/L (ref 39–117)
BUN: 12 mg/dL (ref 6–23)
CO2: 26 meq/L (ref 19–32)
Calcium: 9.5 mg/dL (ref 8.4–10.5)
Chloride: 103 meq/L (ref 96–112)
Creatinine, Ser: 0.84 mg/dL (ref 0.40–1.20)
GFR: 71.14 mL/min (ref >60.00)
Glucose, Bld: 107 mg/dL — ABNORMAL HIGH (ref 70–99)
Potassium: 3.9 meq/L (ref 3.5–5.1)
Sodium: 140 meq/L (ref 135–145)
Total Bilirubin: 0.3 mg/dL (ref 0.2–1.2)
Total Protein: 7.8 g/dL (ref 6.0–8.3)

## 2023-08-02 LAB — HEMOGLOBIN A1C: Hgb A1c MFr Bld: 7.6 % — ABNORMAL HIGH (ref 4.6–6.5)

## 2023-08-02 MED ORDER — CYCLOBENZAPRINE HCL 5 MG PO TABS
5.0000 mg | ORAL_TABLET | Freq: Every day | ORAL | 2 refills | Status: AC
Start: 2023-08-02 — End: ?

## 2023-08-02 NOTE — Assessment & Plan Note (Signed)
  We can use Mounjaro, Ozempic, Trulicity if affordable  Metformin XR ok on  smaller tablets Labs

## 2023-08-02 NOTE — Assessment & Plan Note (Signed)
Not using anything

## 2023-08-02 NOTE — Assessment & Plan Note (Signed)
Treat L troch bursitis, Insomnia Try Flexeril at night

## 2023-08-02 NOTE — Assessment & Plan Note (Addendum)
L troch bursitis - refractory. Pt declined injections On meloxicam 15 mg/d prn F/u w/Ortho Try Flexeril at night

## 2023-08-02 NOTE — Progress Notes (Signed)
Subjective:  Patient ID: Renee Mann, female    DOB: Dec 25, 1955  Age: 67 y.o. MRN: [id number]  CC: Medical Management of Chronic Issues (3 month f/u)   HPI Renee Mann presents for HTN C/o fatigue C/o L leg/hip pain  Outpatient Medications Prior to Visit  Medication Sig Dispense Refill   ACCU-CHEK GUIDE test strip USE TO CHECK BLOOD SUGARS ONCE A DAY 100 strip 3   Accu-Chek Softclix Lancets lancets USE TO CHECK BLOOD SUGAR DAILY 100 each 3   amLODipine (NORVASC) 5 MG tablet TAKE 1 TABLET BY MOUTH EVERY DAY 90 tablet 3   blood glucose meter kit and supplies Dispense based on patient and insurance preference. Use up to four times daily as directed. (FOR ICD-10 E10.9, E11.9). 1 each 0   Blood Glucose Monitoring Suppl (ACCU-CHEK AVIVA PLUS) w/Device KIT Use to check blood sugar daily. DX E11.9 1 kit 0   Blood Glucose Monitoring Suppl DEVI 1 each by Does not apply route in the morning, at noon, and at bedtime. May substitute to any manufacturer covered by patient's insurance. 1 each 0   Cholecalciferol (VITAMIN D3) 50 MCG (2000 UT) capsule Take 1 capsule (2,000 Units total) by mouth daily. (Patient taking differently: Take 5,000 Units by mouth daily.) 100 capsule 3   clotrimazole (LOTRIMIN) 1 % cream APPLY 1 APPLICATION TOPICALLY IN THE MORNING AND AT BEDTIME. USE UNTIL RASH DISAPPEARS AND THEN ANOTHER 3 DAYS. (Patient taking differently: Apply 1 Application topically daily as needed (Itching).) 30 g 3   diphenhydrAMINE (BENADRYL) 25 MG tablet Take 25 mg by mouth every 6 (six) hours as needed for allergies.     dupilumab (DUPIXENT) 300 MG/2ML prefilled syringe Inject 300 mg into the skin every 14 (fourteen) days. 12 mL 3   Glucose Blood (BLOOD GLUCOSE TEST STRIPS) STRP 1 each by In Vitro route in the morning, at noon, and at bedtime. May substitute to any manufacturer covered by patient's insurance. 100 strip 11   hydrocortisone 2.5 % ointment Apply topically twice daily as need to red  sandpapery rash. 30 g 3   hyoscyamine (LEVSIN SL) 0.125 MG SL tablet Place 1 tablet (0.125 mg total) under the tongue every 6 (six) hours as needed for cramping (nausea, AB pain). 30 tablet 0   ketoconazole (NIZORAL) 2 % cream Apply 1 Application topically daily as needed (rash). 30 g 0   meloxicam (MOBIC) 15 MG tablet TAKE 1 TABLET BY MOUTH EVERY DAY AS NEEDED FOR PAIN 30 tablet 1   metFORMIN (GLUCOPHAGE-XR) 750 MG 24 hr tablet TAKE 1 TABLET BY MOUTH ONCE DAILY WITH BREAKFAST 90 tablet 2   OVER THE COUNTER MEDICATION Take 1 tablet by mouth every 6 (six) hours as needed (Feeling in stomach). Altoids     pantoprazole (PROTONIX) 40 MG tablet TAKE 1 TABLET BY MOUTH TWICE A DAY 180 tablet 3   repaglinide (PRANDIN) 1 MG tablet TAKE 1 TABLET BY MOUTH 3 TIMES DAILY BEFORE MEAL(S) 270 tablet 1   triamcinolone cream (KENALOG) 0.1 % APPLY TO AFFECTED AREA TWICE A DAY 30 g 5   zolpidem (AMBIEN) 10 MG tablet Take 0.5-1 tablets (5-10 mg total) by mouth at bedtime as needed for sleep. 30 tablet 3   Facility-Administered Medications Prior to Visit  Medication Dose Route Frequency Provider Last Rate Last Admin   dupilumab (DUPIXENT) prefilled syringe 300 mg  300 mg Subcutaneous Q14 Days Verlee Monte, MD   300 mg at 07/29/23 0909    ROS:  Review of Systems  Constitutional:  Negative for activity change, appetite change, chills, fatigue and unexpected weight change.  HENT:  Negative for congestion, mouth sores and sinus pressure.   Eyes:  Negative for visual disturbance.  Respiratory:  Negative for cough and chest tightness.   Gastrointestinal:  Negative for abdominal pain and nausea.  Genitourinary:  Negative for difficulty urinating, frequency and vaginal pain.  Musculoskeletal:  Positive for arthralgias and gait problem. Negative for back pain.  Skin:  Negative for pallor and rash.  Neurological:  Negative for dizziness, tremors, weakness, numbness and headaches.  Hematological:  Does not bruise/bleed  easily.  Psychiatric/Behavioral:  Positive for sleep disturbance. Negative for confusion.     Objective:  BP (!) 142/94 (BP Location: Left Arm, Patient Position: Sitting, Cuff Size: Large)   Pulse 90   Temp 97.8 F (36.6 C) (Temporal)   Ht 4\' 10"  (1.473 m)   Wt 143 lb (64.9 kg)   SpO2 99%   BMI 29.89 kg/m   BP Readings from Last 3 Encounters:  08/02/23 (!) 142/94  05/03/23 130/70  01/29/23 124/82    Wt Readings from Last 3 Encounters:  08/02/23 143 lb (64.9 kg)  06/11/23 145 lb (65.8 kg)  05/03/23 145 lb (65.8 kg)    Physical Exam Constitutional:      General: She is not in acute distress.    Appearance: She is well-developed. She is obese.  HENT:     Head: Normocephalic.     Right Ear: External ear normal.     Left Ear: External ear normal.     Nose: Nose normal.  Eyes:     General:        Right eye: No discharge.        Left eye: No discharge.     Conjunctiva/sclera: Conjunctivae normal.     Pupils: Pupils are equal, round, and reactive to light.  Neck:     Thyroid: No thyromegaly.     Vascular: No JVD.     Trachea: No tracheal deviation.  Cardiovascular:     Rate and Rhythm: Normal rate and regular rhythm.     Heart sounds: Normal heart sounds.  Pulmonary:     Effort: No respiratory distress.     Breath sounds: No stridor. No wheezing.  Abdominal:     General: Bowel sounds are normal. There is no distension.     Palpations: Abdomen is soft. There is no mass.     Tenderness: There is no abdominal tenderness. There is no guarding or rebound.  Musculoskeletal:        General: Tenderness present.     Cervical back: Normal range of motion and neck supple. No rigidity.  Lymphadenopathy:     Cervical: No cervical adenopathy.  Skin:    Findings: No erythema or rash.  Neurological:     Cranial Nerves: No cranial nerve deficit.     Motor: No abnormal muscle tone.     Coordination: Coordination normal.     Deep Tendon Reflexes: Reflexes normal.   Psychiatric:        Behavior: Behavior normal.        Thought Content: Thought content normal.        Judgment: Judgment normal.   L lat hip w/pain  Lab Results  Component Value Date   WBC 6.2 05/03/2023   HGB 13.0 05/03/2023   HCT 40.2 05/03/2023   PLT 442.0 (H) 05/03/2023   GLUCOSE 112 (H) 05/03/2023   CHOL 170 05/03/2023  TRIG 69.0 05/03/2023   HDL 46.10 05/03/2023   LDLCALC 110 (H) 05/03/2023   ALT 31 05/03/2023   AST 20 05/03/2023   NA 142 05/03/2023   K 3.7 05/03/2023   CL 102 05/03/2023   CREATININE 0.78 05/03/2023   BUN 10 05/03/2023   CO2 30 05/03/2023   TSH 1.91 05/03/2023   HGBA1C 7.6 (H) 05/03/2023   MICROALBUR <0.7 05/03/2023    No results found.  Assessment & Plan:   Problem List Items Addressed This Visit     Type 2 diabetes mellitus (HCC) - Primary     We can use Mounjaro, Ozempic, Trulicity if affordable  Metformin XR ok on  smaller tablets Labs      Relevant Orders   Comprehensive metabolic panel   Hemoglobin A1c   INSOMNIA, PERSISTENT    Not using anything      Hip pain    L troch bursitis - refractory. Pt declined injections On meloxicam 15 mg/d prn F/u w/Ortho Try Flexeril at night      Fatigue    Treat L troch bursitis, Insomnia Try Flexeril at night         Meds ordered this encounter  Medications   cyclobenzaprine (FLEXERIL) 5 MG tablet    Sig: Take 1 tablet (5 mg total) by mouth at bedtime.    Dispense:  30 tablet    Refill:  2      Follow-up: Return in about 3 months (around 10/31/2023) for a follow-up visit.  Sonda Primes, MD

## 2023-08-05 ENCOUNTER — Encounter: Payer: Self-pay | Admitting: Internal Medicine

## 2023-08-08 ENCOUNTER — Other Ambulatory Visit: Payer: Self-pay | Admitting: Internal Medicine

## 2023-08-08 MED ORDER — REPAGLINIDE 1 MG PO TABS: ORAL_TABLET | ORAL | 3 refills | Status: DC

## 2023-08-13 ENCOUNTER — Ambulatory Visit: Payer: Medicare Other

## 2023-08-13 ENCOUNTER — Ambulatory Visit (INDEPENDENT_AMBULATORY_CARE_PROVIDER_SITE_OTHER): Payer: Medicare Other | Admitting: *Deleted

## 2023-08-13 DIAGNOSIS — L209 Atopic dermatitis, unspecified: Secondary | ICD-10-CM

## 2023-08-13 DIAGNOSIS — Z01419 Encounter for gynecological examination (general) (routine) without abnormal findings: Secondary | ICD-10-CM | POA: Diagnosis not present

## 2023-08-13 DIAGNOSIS — Z1231 Encounter for screening mammogram for malignant neoplasm of breast: Secondary | ICD-10-CM | POA: Diagnosis not present

## 2023-08-14 ENCOUNTER — Other Ambulatory Visit: Payer: Self-pay | Admitting: Internal Medicine

## 2023-08-20 ENCOUNTER — Other Ambulatory Visit: Payer: Self-pay | Admitting: Obstetrics and Gynecology

## 2023-08-20 DIAGNOSIS — R928 Other abnormal and inconclusive findings on diagnostic imaging of breast: Secondary | ICD-10-CM

## 2023-09-03 ENCOUNTER — Ambulatory Visit (INDEPENDENT_AMBULATORY_CARE_PROVIDER_SITE_OTHER): Payer: Medicare Other | Admitting: *Deleted

## 2023-09-03 DIAGNOSIS — L209 Atopic dermatitis, unspecified: Secondary | ICD-10-CM | POA: Diagnosis not present

## 2023-09-09 ENCOUNTER — Ambulatory Visit
Admission: RE | Admit: 2023-09-09 | Discharge: 2023-09-09 | Disposition: A | Discharge status: Home / Self Care | Payer: Medicare Other | Source: Ambulatory Visit | Attending: Obstetrics and Gynecology | Admitting: Obstetrics and Gynecology

## 2023-09-09 DIAGNOSIS — R928 Other abnormal and inconclusive findings on diagnostic imaging of breast: Secondary | ICD-10-CM

## 2023-09-09 DIAGNOSIS — N6315 Unspecified lump in the right breast, overlapping quadrants: Secondary | ICD-10-CM | POA: Diagnosis not present

## 2023-09-10 ENCOUNTER — Other Ambulatory Visit: Payer: Self-pay | Admitting: Obstetrics and Gynecology

## 2023-09-10 DIAGNOSIS — N6310 Unspecified lump in the right breast, unspecified quadrant: Secondary | ICD-10-CM

## 2023-09-17 ENCOUNTER — Ambulatory Visit: Payer: Medicare Other

## 2023-09-21 ENCOUNTER — Other Ambulatory Visit: Payer: Self-pay | Admitting: Internal Medicine

## 2023-09-21 DIAGNOSIS — E119 Type 2 diabetes mellitus without complications: Secondary | ICD-10-CM

## 2023-09-21 NOTE — Telephone Encounter (Signed)
Copied from CRM [id number]. Topic: Clinical - Medication Refill >> Sep 21, 2023 10:35 AM Florestine Avers wrote: Most Recent Primary Care Visit:  Provider: Tresa Garter  Department: [facility]  Visit Type: OFFICE VISIT  Date: 08/02/2023  Medication: ACCU-CHEK GUIDE test strip  Has the patient contacted their pharmacy? Yes (Agent: If no, request that the patient contact the pharmacy for the refill. If patient does not wish to contact the pharmacy document the reason why and proceed with request.) (Agent: If yes, when and what did the pharmacy advise?)  Is this the correct pharmacy for this prescription? Yes If no, delete pharmacy and type the correct one.  This is the patient's preferred pharmacy:  [facility] [address] [place] - [address] [place] [address] Phone: [phone] Fax: [phone]   Has the prescription been filled recently? Yes  Is the patient out of the medication? Yes  Has the patient been seen for an appointment in the last year OR does the patient have an upcoming appointment? Yes  Can we respond through MyChart? Yes  Agent: Please be advised that Rx refills may take up to 3 business days. We ask that you follow-up with your pharmacy.

## 2023-09-22 ENCOUNTER — Telehealth: Payer: Self-pay | Admitting: Internal Medicine

## 2023-09-22 ENCOUNTER — Other Ambulatory Visit: Payer: Self-pay

## 2023-09-22 DIAGNOSIS — E119 Type 2 diabetes mellitus without complications: Secondary | ICD-10-CM

## 2023-09-22 MED ORDER — ACCU-CHEK GUIDE TEST VI STRP: 1.0000 | ORAL_STRIP | 3 refills | Status: DC | PRN

## 2023-09-22 NOTE — Telephone Encounter (Signed)
Test strips have been sent in with proper instructions as stated by Pharmacist.

## 2023-09-22 NOTE — Telephone Encounter (Signed)
Copied from CRM [id number]. Topic: Clinical - Prescription Issue >> Sep 22, 2023 11:57 AM Florestine Avers wrote: Reason for CRM: The Pharmacist from [facility] states that the patients [facility] does not cover her test strips, but medicare does. In order for Medicare to pay for it the provider has to E-scribe the strips, as well as provide a diagnosis code and proper detailed instructions on how the strips should be used by the patient. Pharmacist states that if you have any questions feel free to contact them @ [phone].

## 2023-09-23 ENCOUNTER — Other Ambulatory Visit: Payer: Self-pay

## 2023-09-23 DIAGNOSIS — E119 Type 2 diabetes mellitus without complications: Secondary | ICD-10-CM

## 2023-09-23 MED ORDER — ACCU-CHEK GUIDE TEST VI STRP: ORAL_STRIP | 3 refills | Status: DC

## 2023-09-23 NOTE — Telephone Encounter (Signed)
Copied from CRM [id number]. Topic: Clinical - Prescription Issue >> Sep 23, 2023 12:30 PM Sonny Dandy B wrote: Reason for CRM: [facility], [facility]. [facility] [address] [phone] called regarding ACCU-CHEK GUIDE test strip states she needs a new prescription, to state the pt with test 1 once Insurance will not cover previous instructions for you as directed it must state once per day . Please call [facility] back to update the order .

## 2023-09-24 NOTE — Progress Notes (Unsigned)
FOLLOW UP Date of Service/Encounter:  09/27/23  Subjective:  Renee Mann (DOB: 12-17-1955) is a 68 y.o. female who [facility] in re-evaluation of the following: atopic dermatitis with prurigo nodularis, previous history of hives History obtained from: chart review and patient.  For Review, LV was on 09/30/22  with Dr.Italia Wolfert seen for routine follow-up. See below for summary of history and diagnostics.   Therapeutic plans/changes recommended: continued on dupixent. ----------------------------------------------------- Pertinent History/Diagnostics:  Patient previously on Xolair but stopped in 2021 due to an insurance issue.  Hives were controlled at that time so she continued on antihistamines and topical steroids.  However in March 2023, rash returned and she wanted to restart Xolair which was started in April 2023.  Exam normal, but Xolair restarted based on patient history.  In October 2023, after 5 Xolair injections, she reports no improvement in rash.  Rash on exam showed hyperpigmented macules scattered on back and abdomen with excoriations, concern for atopic dermatitis with prurigo nodularis. Started on Dupixent.  At her Jan 2024 follow-up, rash on trunk improved and generalized pruritus significantly improved.  However, still reporting rash on breast and groin.  She reported mod-high glycemia, but never reaching > 200 when checked (non-insulin dependent diabetic). Exam concerning for candidiasis treated with lotrimin, and we discussed oral treatment if fails topical.  --------------------------------------------------- Today presents for follow-up. Discussed the use of AI scribe software for clinical note transcription with the patient, who gave verbal consent to proceed.  History of Present Illness   The patient presents with a follow-up regarding Dupixent treatment for skin itching.  The patient has been experiencing overall improvement  in her skin condition with Dupixent treatment. However, she recently experienced itching due to missing a scheduled injection two weeks ago because of moving. To manage the itching from the missed Dupixent shot, she has been using her topical cream (triamcinolone), which has provided relief. She confirms that Dupixent is otherwise working well for her. She tolerates the injections well. She is not requiring the use of topical steroids or antihistamines regularly.  She has been in contact with Dupixent representatives to ensure her recertification and insurance coverage are in place. She has completed the necessary paperwork and confirmed that everything is in order for continued treatment through 2025.  She is recovering from a recent flu, which has been prevalent in the community. She is on the mend from this.     Chart Review: Her last Dupixent injection was 09/03/2023.  She received 300 mg every 2 weeks.  All medications reviewed by clinical staff and updated in chart. No new pertinent medical or surgical history except as noted in HPI.  ROS: All others negative except as noted per HPI.   Objective:  BP 130/84 (BP Location: Right Arm, Patient Position: Sitting, Cuff Size: Normal)   Pulse 82   Temp 98.3 F (36.8 C) (Temporal)   Resp 18   Ht 4\' 10"  (1.473 m)   Wt 146 lb 12.8 oz (66.6 kg)   SpO2 99%   BMI 30.68 kg/m  Body mass index is 30.68 kg/m. Physical Exam: General Appearance:  Alert, cooperative, no distress, appears stated age  Head:  Normocephalic, without obvious abnormality, atraumatic  Eyes:  Conjunctiva clear, EOM's intact  Ears EACs normal bilaterally and normal TMs bilaterally  Nose: Nares normal, normal mucosa and no visible anterior polyps  Throat: Lips, tongue normal; teeth and gums normal, normal posterior oropharynx  Neck: Supple, symmetrical  Lungs:   clear to auscultation bilaterally, Respirations unlabored, no coughing  Heart:  regular rate and rhythm and no  murmur, Appears well perfused  Extremities: No edema  Skin: Skin color, texture, turgor normal and no rashes or lesions on visualized portions of skin  Neurologic: No gross deficits   Labs:  Lab Orders  No laboratory test(s) ordered today   Assessment/Plan   Atopic Dermatitis with prurigo nodularis  1. Every day use zyrtec (cetirizine) 10 mg twice daily (max 40 mg/day = 2 tablets twice daily) as needed. Can use this in place of benadryl to avoid sleepiness.   2. Triamcinolone ointment on body up to twice daily in areas of rash. Do not use on face, groin or arm pits. Will send in hydrocortisone 2.5% ointment for these areas.   3. Nasal saline spray as needed  4 Continue Dupixent per protocol.Injection given today in clinic.  Follow-up in 12 months, sooner if needed. It was a pleasure seeing you in clinic again today.  Other: dupixent received in clinic today.  Tonny Bollman, MD  [facility]

## 2023-09-25 ENCOUNTER — Other Ambulatory Visit: Payer: Self-pay | Admitting: Internal Medicine

## 2023-09-27 ENCOUNTER — Ambulatory Visit: Payer: Medicare Other

## 2023-09-27 ENCOUNTER — Ambulatory Visit: Payer: Medicare Other | Admitting: Internal Medicine

## 2023-09-27 ENCOUNTER — Encounter: Payer: Self-pay | Admitting: Internal Medicine

## 2023-09-27 ENCOUNTER — Other Ambulatory Visit: Payer: Self-pay

## 2023-09-27 VITALS — BP 130/84 | HR 82 | Temp 98.3°F | Resp 18 | Ht <= 58 in | Wt 146.8 lb

## 2023-09-27 DIAGNOSIS — L281 Prurigo nodularis: Secondary | ICD-10-CM

## 2023-09-27 DIAGNOSIS — L508 Other urticaria: Secondary | ICD-10-CM | POA: Diagnosis not present

## 2023-09-27 DIAGNOSIS — L209 Atopic dermatitis, unspecified: Secondary | ICD-10-CM

## 2023-09-27 DIAGNOSIS — L509 Urticaria, unspecified: Secondary | ICD-10-CM

## 2023-09-27 NOTE — Patient Instructions (Addendum)
Atopic Dermatitis with prurigo nodularis  1. Every day use zyrtec (cetirizine) 10 mg twice daily (max 40 mg/day = 2 tablets twice daily) as needed. Can use this in place of benadryl to avoid sleepiness.   2. Triamcinolone ointment on body up to twice daily in areas of rash. Do not use on face, groin or arm pits. Will send in hydrocortisone 2.5% ointment for these areas.   3. Nasal saline spray as needed  4 Continue Dupixent per protocol.Injection given today in clinic.  Follow-up in 12 months, sooner if needed. It was a pleasure seeing you in clinic again today.

## 2023-10-13 ENCOUNTER — Other Ambulatory Visit: Payer: Self-pay | Admitting: Internal Medicine

## 2023-10-15 ENCOUNTER — Ambulatory Visit (INDEPENDENT_AMBULATORY_CARE_PROVIDER_SITE_OTHER): Payer: PRIVATE HEALTH INSURANCE

## 2023-10-15 DIAGNOSIS — L209 Atopic dermatitis, unspecified: Secondary | ICD-10-CM | POA: Diagnosis not present

## 2023-10-29 ENCOUNTER — Ambulatory Visit: Payer: Medicare Other | Admitting: *Deleted

## 2023-10-29 DIAGNOSIS — L209 Atopic dermatitis, unspecified: Secondary | ICD-10-CM | POA: Diagnosis not present

## 2023-11-02 ENCOUNTER — Ambulatory Visit (INDEPENDENT_AMBULATORY_CARE_PROVIDER_SITE_OTHER): Payer: Medicare Other | Admitting: Internal Medicine

## 2023-11-02 ENCOUNTER — Encounter: Payer: Self-pay | Admitting: Internal Medicine

## 2023-11-02 VITALS — BP 120/68 | HR 110 | Temp 98.0°F | Ht <= 58 in | Wt 140.0 lb

## 2023-11-02 DIAGNOSIS — I10 Essential (primary) hypertension: Secondary | ICD-10-CM | POA: Diagnosis not present

## 2023-11-02 DIAGNOSIS — F5101 Primary insomnia: Secondary | ICD-10-CM

## 2023-11-02 DIAGNOSIS — Z7985 Long-term (current) use of injectable non-insulin antidiabetic drugs: Secondary | ICD-10-CM | POA: Diagnosis not present

## 2023-11-02 DIAGNOSIS — E119 Type 2 diabetes mellitus without complications: Secondary | ICD-10-CM

## 2023-11-02 DIAGNOSIS — Z7984 Long term (current) use of oral hypoglycemic drugs: Secondary | ICD-10-CM | POA: Diagnosis not present

## 2023-11-02 DIAGNOSIS — M1990 Unspecified osteoarthritis, unspecified site: Secondary | ICD-10-CM | POA: Diagnosis not present

## 2023-11-02 LAB — HEMOGLOBIN A1C: Hgb A1c MFr Bld: 7.3 % — ABNORMAL HIGH (ref 4.6–6.5)

## 2023-11-02 LAB — COMPREHENSIVE METABOLIC PANEL
ALT: 15 U/L (ref 0–35)
AST: 14 U/L (ref 0–37)
Albumin: 4.5 g/dL (ref 3.5–5.2)
Alkaline Phosphatase: 88 U/L (ref 39–117)
BUN: 11 mg/dL (ref 6–23)
CO2: 28 meq/L (ref 19–32)
Calcium: 10.1 mg/dL (ref 8.4–10.5)
Chloride: 101 meq/L (ref 96–112)
Creatinine, Ser: 0.73 mg/dL (ref 0.40–1.20)
GFR: 84.04 mL/min (ref >60.00)
Glucose, Bld: 101 mg/dL — ABNORMAL HIGH (ref 70–99)
Potassium: 4 meq/L (ref 3.5–5.1)
Sodium: 140 meq/L (ref 135–145)
Total Bilirubin: 0.3 mg/dL (ref 0.2–1.2)
Total Protein: 8 g/dL (ref 6.0–8.3)

## 2023-11-02 MED ORDER — ACCU-CHEK GUIDE TEST VI STRP: ORAL_STRIP | 3 refills | Status: DC

## 2023-11-02 MED ORDER — ACCU-CHEK SOFTCLIX LANCETS MISC: 3 refills | Status: DC

## 2023-11-02 MED ORDER — TRAZODONE HCL 50 MG PO TABS: 25.0000 mg | ORAL_TABLET | Freq: Every evening | ORAL | 5 refills | Status: AC | PRN

## 2023-11-02 MED ORDER — METFORMIN HCL ER 750 MG PO TB24: 750.0000 mg | ORAL_TABLET | Freq: Every day | ORAL | 2 refills | Status: DC

## 2023-11-02 NOTE — Assessment & Plan Note (Signed)
 Worse. Trial of Trazodone

## 2023-11-02 NOTE — Assessment & Plan Note (Signed)
  We can use Mounjaro, Ozempic, Trulicity if affordable  Metformin XR ok on  smaller tablets Labs

## 2023-11-02 NOTE — Assessment & Plan Note (Signed)
 Doing better BP Readings from Last 3 Encounters:  11/02/23 120/68  09/27/23 130/84  08/02/23 (!) 142/94

## 2023-11-02 NOTE — Assessment & Plan Note (Signed)
On Meloxicam prn  Potential benefits of a long term NSAID use as well as potential risks  and complications were explained to the patient and were aknowledged.

## 2023-11-02 NOTE — Progress Notes (Signed)
 Subjective:  Patient ID: Renee Mann, female    DOB: Apr 26, 1956  Age: 68 y.o. MRN: [id number]  CC: Medical Management of Chronic Issues (3 mnth f/u)   HPI Renee Mann presents for DM, HTN, OA C/o insomnia  Outpatient Medications Prior to Visit  Medication Sig Dispense Refill   ACCU-CHEK GUIDE test strip USE TO CHECK BLOOD SUGARS ONCE A DAY 100 strip 3   amLODipine (NORVASC) 5 MG tablet TAKE 1 TABLET BY MOUTH EVERY DAY 90 tablet 3   blood glucose meter kit and supplies Dispense based on patient and insurance preference. Use up to four times daily as directed. (FOR ICD-10 E10.9, E11.9). 1 each 0   Blood Glucose Monitoring Suppl (ACCU-CHEK AVIVA PLUS) w/Device KIT Use to check blood sugar daily. DX E11.9 1 kit 0   Blood Glucose Monitoring Suppl DEVI 1 each by Does not apply route in the morning, at noon, and at bedtime. May substitute to any manufacturer covered by patient's insurance. 1 each 0   Cholecalciferol (VITAMIN D3) 50 MCG (2000 UT) capsule Take 1 capsule (2,000 Units total) by mouth daily. (Patient taking differently: Take 5,000 Units by mouth daily.) 100 capsule 3   clotrimazole (LOTRIMIN) 1 % cream APPLY 1 APPLICATION TOPICALLY IN THE MORNING AND AT BEDTIME. USE UNTIL RASH DISAPPEARS AND THEN ANOTHER 3 DAYS. (Patient taking differently: Apply 1 Application topically daily as needed (Itching).) 30 g 3   cyclobenzaprine (FLEXERIL) 5 MG tablet Take 1 tablet (5 mg total) by mouth at bedtime. 30 tablet 2   diphenhydrAMINE (BENADRYL) 25 MG tablet Take 25 mg by mouth every 6 (six) hours as needed for allergies.     dupilumab (DUPIXENT) 300 MG/2ML prefilled syringe Inject 300 mg into the skin every 14 (fourteen) days. 12 mL 3   gabapentin (NEURONTIN) 300 MG capsule Take 300 mg by mouth 3 (three) times daily.     Glucose Blood (BLOOD GLUCOSE TEST STRIPS) STRP 1 each by In Vitro route in the morning, at noon, and at bedtime. May substitute to any manufacturer covered by patient's  insurance. 100 strip 11   hydrocortisone 2.5 % ointment Apply topically twice daily as need to red sandpapery rash. 30 g 3   hyoscyamine (LEVSIN SL) 0.125 MG SL tablet Place 1 tablet (0.125 mg total) under the tongue every 6 (six) hours as needed for cramping (nausea, AB pain). 30 tablet 0   ketoconazole (NIZORAL) 2 % cream Apply 1 Application topically daily as needed (rash). 30 g 0   levocetirizine (XYZAL) 5 MG tablet Take 1 tablet by mouth 2 (two) times daily.     meloxicam (MOBIC) 15 MG tablet TAKE 1 TABLET BY MOUTH EVERY DAY AS NEEDED FOR PAIN 30 tablet 1   omalizumab (XOLAIR) 150 MG/ML prefilled syringe Inject 150 mg into the skin.     OVER THE COUNTER MEDICATION Take 1 tablet by mouth every 6 (six) hours as needed (Feeling in stomach). Altoids     pantoprazole (PROTONIX) 40 MG tablet TAKE 1 TABLET BY MOUTH EVERY DAY 90 tablet 3   repaglinide (PRANDIN) 1 MG tablet TAKE 1 TABLET BY MOUTH 3 TIMES DAILY BEFORE MEAL(S) 270 tablet 3   triamcinolone cream (KENALOG) 0.1 % APPLY TO AFFECTED AREA TWICE A DAY 30 g 5   Accu-Chek Softclix Lancets lancets USE TO CHECK BLOOD SUGAR DAILY 100 each 3   glucose blood (ACCU-CHEK GUIDE TEST) test strip Use test strips to check blood sugars 1x a day. 300 each 3  metFORMIN (GLUCOPHAGE-XR) 750 MG 24 hr tablet TAKE 1 TABLET BY MOUTH ONCE DAILY WITH BREAKFAST 90 tablet 2   Facility-Administered Medications Prior to Visit  Medication Dose Route Frequency Provider Last Rate Last Admin   dupilumab (DUPIXENT) prefilled syringe 300 mg  300 mg Subcutaneous Q14 Days Verlee Monte, MD   300 mg at 10/29/23 0903    ROS: Review of Systems  Constitutional:  Negative for activity change, appetite change, chills, fatigue and unexpected weight change.  HENT:  Negative for congestion, mouth sores and sinus pressure.   Eyes:  Negative for visual disturbance.  Respiratory:  Negative for cough and chest tightness.   Gastrointestinal:  Negative for abdominal pain and nausea.   Genitourinary:  Negative for difficulty urinating, frequency and vaginal pain.  Musculoskeletal:  Negative for back pain and gait problem.  Skin:  Negative for pallor and rash.  Neurological:  Negative for dizziness, tremors, weakness, numbness and headaches.  Psychiatric/Behavioral:  Negative for confusion and sleep disturbance.     Objective:  BP 120/68   Pulse (!) 110   Temp 98 F (36.7 C) (Oral)   Ht 4\' 10"  (1.473 m)   Wt 140 lb (63.5 kg)   SpO2 99%   BMI 29.26 kg/m   BP Readings from Last 3 Encounters:  11/02/23 120/68  09/27/23 130/84  08/02/23 (!) 142/94    Wt Readings from Last 3 Encounters:  11/02/23 140 lb (63.5 kg)  09/27/23 146 lb 12.8 oz (66.6 kg)  08/02/23 143 lb (64.9 kg)    Physical Exam Constitutional:      General: She is not in acute distress.    Appearance: She is well-developed. She is obese.  HENT:     Head: Normocephalic.     Right Ear: External ear normal.     Left Ear: External ear normal.     Nose: Nose normal.  Eyes:     General:        Right eye: No discharge.        Left eye: No discharge.     Conjunctiva/sclera: Conjunctivae normal.     Pupils: Pupils are equal, round, and reactive to light.  Neck:     Thyroid: No thyromegaly.     Vascular: No JVD.     Trachea: No tracheal deviation.  Cardiovascular:     Rate and Rhythm: Normal rate and regular rhythm.     Heart sounds: Normal heart sounds.  Pulmonary:     Effort: No respiratory distress.     Breath sounds: No stridor. No wheezing.  Abdominal:     General: Bowel sounds are normal. There is no distension.     Palpations: Abdomen is soft. There is no mass.     Tenderness: There is no abdominal tenderness. There is no guarding or rebound.  Musculoskeletal:        General: No tenderness.     Cervical back: Normal range of motion and neck supple. No rigidity.  Lymphadenopathy:     Cervical: No cervical adenopathy.  Skin:    Findings: No erythema or rash.  Neurological:      Cranial Nerves: No cranial nerve deficit.     Motor: No abnormal muscle tone.     Coordination: Coordination normal.     Deep Tendon Reflexes: Reflexes normal.  Psychiatric:        Behavior: Behavior normal.        Thought Content: Thought content normal.        Judgment: Judgment  normal.     Lab Results  Component Value Date   WBC 6.2 05/03/2023   HGB 13.0 05/03/2023   HCT 40.2 05/03/2023   PLT 442.0 (H) 05/03/2023   GLUCOSE 107 (H) 08/02/2023   CHOL 170 05/03/2023   TRIG 69.0 05/03/2023   HDL 46.10 05/03/2023   LDLCALC 110 (H) 05/03/2023   ALT 12 08/02/2023   AST 15 08/02/2023   NA 140 08/02/2023   K 3.9 08/02/2023   CL 103 08/02/2023   CREATININE 0.84 08/02/2023   BUN 12 08/02/2023   CO2 26 08/02/2023   TSH 1.91 05/03/2023   HGBA1C 7.6 (H) 08/02/2023   MICROALBUR <0.7 05/03/2023    MM 3D DIAGNOSTIC MAMMOGRAM UNILATERAL RIGHT BREAST Addendum Date: 10/05/2023 ADDENDUM REPORT: 10/05/2023 10:59 ADDENDUM: An addendum is made to the technique section of this exam due to an omission. This should include: EXAM: DIGITAL DIAGNOSTIC UNILATERAL RIGHT MAMMOGRAM WITH TOMOSYNTHESIS AND CAD; RIGHT BREAST ULTRASOUND Electronically Signed   By: Edwin Cap M.D.   On: 10/05/2023 10:59   Result Date: 10/05/2023 CLINICAL DATA:  Screening recall for possible right breast mass. EXAM: DIGITAL DIAGNOSTIC UNILATERAL RIGHT MAMMOGRAM WITH TOMOSYNTHESIS AND CAD TECHNIQUE: Right digital diagnostic mammography and breast tomosynthesis was performed. The images were evaluated with computer-aided detection. COMPARISON:  Previous exam(s). ACR Breast Density Category b: There are scattered areas of fibroglandular density. FINDINGS: Spot compression tomograms were performed of the right breast. There is a low-density oval mass with possible lucent center measuring 0.4 cm. Targeted ultrasound of the right breast was performed. There is an oval circumscribed mass in the right breast at 6 o'clock retroareolar  which is peripherally hypoechoic and hyperechoic centrally. This measures 0.4 x 0.2 x 0.3 cm, possibly an intramammary lymph node. This is felt to correspond with mammography findings. Normal lymph nodes are present in the right axilla. IMPRESSION: Probably benign right breast mass. RECOMMENDATION: Recommend six-month follow-up diagnostic mammography of the right breast with possible ultrasound. I have discussed the findings and recommendations with the patient. If applicable, a reminder letter will be sent to the patient regarding the next appointment. BI-RADS CATEGORY  3: Probably benign. Electronically Signed: By: Edwin Cap M.D. On: 09/09/2023 12:00   [place] LIMITED ULTRASOUND INCLUDING AXILLA RIGHT BREAST Addendum Date: 10/05/2023 ADDENDUM REPORT: 10/05/2023 10:59 ADDENDUM: An addendum is made to the technique section of this exam due to an omission. This should include: EXAM: DIGITAL DIAGNOSTIC UNILATERAL RIGHT MAMMOGRAM WITH TOMOSYNTHESIS AND CAD; RIGHT BREAST ULTRASOUND Electronically Signed   By: Edwin Cap M.D.   On: 10/05/2023 10:59   Result Date: 10/05/2023 CLINICAL DATA:  Screening recall for possible right breast mass. EXAM: DIGITAL DIAGNOSTIC UNILATERAL RIGHT MAMMOGRAM WITH TOMOSYNTHESIS AND CAD TECHNIQUE: Right digital diagnostic mammography and breast tomosynthesis was performed. The images were evaluated with computer-aided detection. COMPARISON:  Previous exam(s). ACR Breast Density Category b: There are scattered areas of fibroglandular density. FINDINGS: Spot compression tomograms were performed of the right breast. There is a low-density oval mass with possible lucent center measuring 0.4 cm. Targeted ultrasound of the right breast was performed. There is an oval circumscribed mass in the right breast at 6 o'clock retroareolar which is peripherally hypoechoic and hyperechoic centrally. This measures 0.4 x 0.2 x 0.3 cm, possibly an intramammary lymph node. This is felt to correspond  with mammography findings. Normal lymph nodes are present in the right axilla. IMPRESSION: Probably benign right breast mass. RECOMMENDATION: Recommend six-month follow-up diagnostic mammography of the right breast with possible  ultrasound. I have discussed the findings and recommendations with the patient. If applicable, a reminder letter will be sent to the patient regarding the next appointment. BI-RADS CATEGORY  3: Probably benign. Electronically Signed: By: Edwin Cap M.D. On: 09/09/2023 12:00    Assessment & Plan:   Problem List Items Addressed This Visit     Hypertension   Doing better BP Readings from Last 3 Encounters:  11/02/23 120/68  09/27/23 130/84  08/02/23 (!) 142/94         Insomnia disorder - Primary   Worse. Trial of Trazodone      Osteoarthritis   On Meloxicam prn  Potential benefits of a long term NSAID use as well as potential risks  and complications were explained to the patient and were aknowledged.      Type 2 diabetes mellitus (HCC)    We can use Mounjaro, Ozempic, Trulicity if affordable  Metformin XR ok on  smaller tablets Labs      Relevant Medications   metFORMIN (GLUCOPHAGE-XR) 750 MG 24 hr tablet   glucose blood (ACCU-CHEK GUIDE TEST) test strip   Accu-Chek Softclix Lancets lancets   Other Relevant Orders   Comprehensive metabolic panel   Hemoglobin A1c      Meds ordered this encounter  Medications   metFORMIN (GLUCOPHAGE-XR) 750 MG 24 hr tablet    Sig: Take 1 tablet (750 mg total) by mouth daily with breakfast.    Dispense:  90 tablet    Refill:  2   glucose blood (ACCU-CHEK GUIDE TEST) test strip    Sig: Use test strips to check blood sugars 1x a day.    Dispense:  300 each    Refill:  3   Accu-Chek Softclix Lancets lancets    Sig: USE TO CHECK BLOOD SUGAR DAILY    Dispense:  100 each    Refill:  3   traZODone (DESYREL) 50 MG tablet    Sig: Take 0.5-1 tablets (25-50 mg total) by mouth at bedtime as needed for sleep.     Dispense:  30 tablet    Refill:  5      Follow-up: Return in about 3 months (around 02/02/2024) for a follow-up visit.  Sonda Primes, MD

## 2023-11-03 ENCOUNTER — Encounter: Payer: Self-pay | Admitting: Internal Medicine

## 2023-11-03 DIAGNOSIS — Z23 Encounter for immunization: Secondary | ICD-10-CM | POA: Diagnosis not present

## 2023-11-12 ENCOUNTER — Ambulatory Visit

## 2023-11-12 DIAGNOSIS — L209 Atopic dermatitis, unspecified: Secondary | ICD-10-CM

## 2023-11-26 ENCOUNTER — Ambulatory Visit

## 2023-11-26 DIAGNOSIS — L209 Atopic dermatitis, unspecified: Secondary | ICD-10-CM

## 2023-12-09 ENCOUNTER — Ambulatory Visit (INDEPENDENT_AMBULATORY_CARE_PROVIDER_SITE_OTHER): Payer: PRIVATE HEALTH INSURANCE

## 2023-12-09 DIAGNOSIS — L209 Atopic dermatitis, unspecified: Secondary | ICD-10-CM

## 2023-12-23 ENCOUNTER — Ambulatory Visit (INDEPENDENT_AMBULATORY_CARE_PROVIDER_SITE_OTHER): Payer: PRIVATE HEALTH INSURANCE

## 2023-12-23 DIAGNOSIS — L209 Atopic dermatitis, unspecified: Secondary | ICD-10-CM

## 2023-12-27 ENCOUNTER — Other Ambulatory Visit (HOSPITAL_COMMUNITY): Payer: Self-pay

## 2023-12-27 ENCOUNTER — Telehealth: Payer: Self-pay | Admitting: Pharmacy Technician

## 2023-12-27 DIAGNOSIS — E119 Type 2 diabetes mellitus without complications: Secondary | ICD-10-CM

## 2023-12-27 NOTE — Telephone Encounter (Signed)
 Pharmacy Patient Advocate Encounter   Received notification from CoverMyMeds that prior authorization for Accu-Chek Guide Test strips is required/requested.   Insurance verification completed.   The patient is insured through [facility] ADVANTAGE/RX ADVANCE .   Per test claim:  ONE [employer] is preferred by the insurance.  If suggested medication is appropriate, Please send in a new RX and discontinue this one. If not, please advise as to why it's not appropriate so that we may request a Prior Authorization. Please note, some preferred medications may still require a PA.  If the suggested medications have not been trialed and there are no contraindications to their use, the PA will not be submitted, as it will not be approved.  Key: [id number]

## 2023-12-28 MED ORDER — BLOOD GLUCOSE MONITORING SUPPL DEVI
1.0000 | Freq: Three times a day (TID) | 0 refills | Status: AC
Start: 2023-12-28 — End: ?

## 2023-12-28 MED ORDER — LANCETS MISC. MISC: 1.0000 | Freq: Three times a day (TID) | 0 refills | Status: AC

## 2023-12-28 MED ORDER — BLOOD GLUCOSE TEST VI STRP: 1.0000 | ORAL_STRIP | Freq: Three times a day (TID) | 3 refills | Status: AC

## 2023-12-28 MED ORDER — LANCET DEVICE MISC: 1.0000 | Freq: Three times a day (TID) | 0 refills | Status: AC

## 2023-12-28 NOTE — Telephone Encounter (Signed)
 Noted.  Please send the prescription.  Thanks

## 2023-12-28 NOTE — Addendum Note (Signed)
 Addended by: Hildegard Low R on: 12/28/2023 11:32 AM   Modules accepted: Orders

## 2023-12-28 NOTE — Telephone Encounter (Signed)
 I was able to send in a rx for a new device and supplies according to her insurance company to the one that is covered ONE TOUCH.

## 2024-01-06 ENCOUNTER — Ambulatory Visit

## 2024-01-06 DIAGNOSIS — L209 Atopic dermatitis, unspecified: Secondary | ICD-10-CM | POA: Diagnosis not present

## 2024-01-24 ENCOUNTER — Ambulatory Visit

## 2024-01-24 DIAGNOSIS — L209 Atopic dermatitis, unspecified: Secondary | ICD-10-CM | POA: Diagnosis not present

## 2024-02-02 ENCOUNTER — Ambulatory Visit (INDEPENDENT_AMBULATORY_CARE_PROVIDER_SITE_OTHER): Admitting: Internal Medicine

## 2024-02-02 ENCOUNTER — Encounter: Payer: Self-pay | Admitting: Internal Medicine

## 2024-02-02 VITALS — BP 120/70 | HR 81 | Temp 98.3°F | Ht <= 58 in | Wt 148.0 lb

## 2024-02-02 DIAGNOSIS — E119 Type 2 diabetes mellitus without complications: Secondary | ICD-10-CM | POA: Diagnosis not present

## 2024-02-02 DIAGNOSIS — Z7985 Long-term (current) use of injectable non-insulin antidiabetic drugs: Secondary | ICD-10-CM | POA: Diagnosis not present

## 2024-02-02 DIAGNOSIS — M10079 Idiopathic gout, unspecified ankle and foot: Secondary | ICD-10-CM

## 2024-02-02 DIAGNOSIS — I10 Essential (primary) hypertension: Secondary | ICD-10-CM | POA: Diagnosis not present

## 2024-02-02 DIAGNOSIS — L509 Urticaria, unspecified: Secondary | ICD-10-CM

## 2024-02-02 LAB — COMPREHENSIVE METABOLIC PANEL WITH GFR
ALT: 12 U/L (ref 0–35)
AST: 13 U/L (ref 0–37)
Albumin: 4.4 g/dL (ref 3.5–5.2)
Alkaline Phosphatase: 93 U/L (ref 39–117)
BUN: 10 mg/dL (ref 6–23)
CO2: 28 meq/L (ref 19–32)
Calcium: 10 mg/dL (ref 8.4–10.5)
Chloride: 104 meq/L (ref 96–112)
Creatinine, Ser: 0.81 mg/dL (ref 0.40–1.20)
GFR: 74.05 mL/min (ref >60.00)
Glucose, Bld: 129 mg/dL — ABNORMAL HIGH (ref 70–99)
Potassium: 4.3 meq/L (ref 3.5–5.1)
Sodium: 140 meq/L (ref 135–145)
Total Bilirubin: 0.3 mg/dL (ref 0.2–1.2)
Total Protein: 7.4 g/dL (ref 6.0–8.3)

## 2024-02-02 LAB — HEMOGLOBIN A1C: Hgb A1c MFr Bld: 7.4 % — ABNORMAL HIGH (ref 4.6–6.5)

## 2024-02-02 MED ORDER — TIRZEPATIDE 5 MG/0.5ML ~~LOC~~ SOAJ: 5.0000 mg | SUBCUTANEOUS | 3 refills | Status: DC

## 2024-02-02 NOTE — Progress Notes (Signed)
 Subjective:  Patient ID: Renee Mann, female    DOB: Sep 07, 1955  Age: 68 y.o. MRN: [id number]  CC: Medical Management of Chronic Issues (3 MNTH F/U)   HPI [name] presents for DM, HTN, obesity  Outpatient Medications Prior to Visit  Medication Sig Dispense Refill   Accu-Chek Softclix Lancets lancets USE TO CHECK BLOOD SUGAR DAILY 100 each 3   amLODipine  (NORVASC ) 5 MG tablet TAKE 1 TABLET BY MOUTH EVERY DAY 90 tablet 3   blood glucose meter kit and supplies Dispense based on patient and insurance preference. Use up to four times daily as directed. (FOR ICD-10 E10.9, E11.9). 1 each 0   Blood Glucose Monitoring Suppl (ACCU-CHEK AVIVA PLUS) w/Device KIT Use to check blood sugar daily. DX E11.9 1 kit 0   Blood Glucose Monitoring Suppl DEVI 1 each by Does not apply route in the morning, at noon, and at bedtime. May substitute to any manufacturer covered by patient's insurance ([facility]). Dx code: E11.9 1 each 0   Cholecalciferol (VITAMIN D3) 50 MCG (2000 UT) capsule Take 1 capsule (2,000 Units total) by mouth daily. (Patient taking differently: Take 5,000 Units by mouth daily.) 100 capsule 3   clotrimazole  (LOTRIMIN ) 1 % cream APPLY 1 APPLICATION TOPICALLY IN THE MORNING AND AT BEDTIME. USE UNTIL RASH DISAPPEARS AND THEN ANOTHER 3 DAYS. (Patient taking differently: Apply 1 Application topically daily as needed (Itching).) 30 g 3   cyclobenzaprine  (FLEXERIL ) 5 MG tablet Take 1 tablet (5 mg total) by mouth at bedtime. 30 tablet 2   diphenhydrAMINE  (BENADRYL ) 25 MG tablet Take 25 mg by mouth every 6 (six) hours as needed for allergies.     dupilumab  (DUPIXENT ) 300 MG/2ML prefilled syringe Inject 300 mg into the skin every 14 (fourteen) days. 12 mL 3   gabapentin  (NEURONTIN ) 300 MG capsule Take 300 mg by mouth 3 (three) times daily.     Glucose Blood (BLOOD GLUCOSE TEST STRIPS) STRP 1 each by In Vitro route in the morning, at noon, and at bedtime. May substitute to any manufacturer covered by  patient's insurance [employer]). Dx code: E11.9 100 strip 3   hydrocortisone  2.5 % ointment Apply topically twice daily as need to red sandpapery rash. 30 g 3   hyoscyamine  (LEVSIN SL) 0.125 MG SL tablet Place 1 tablet (0.125 mg total) under the tongue every 6 (six) hours as needed for cramping (nausea, AB pain). 30 tablet 0   ketoconazole  (NIZORAL ) 2 % cream Apply 1 Application topically daily as needed (rash). 30 g 0   levocetirizine (XYZAL ) 5 MG tablet Take 1 tablet by mouth 2 (two) times daily.     meloxicam  (MOBIC ) 15 MG tablet TAKE 1 TABLET BY MOUTH EVERY DAY AS NEEDED FOR PAIN 30 tablet 1   metFORMIN  (GLUCOPHAGE -XR) 750 MG 24 hr tablet Take 1 tablet (750 mg total) by mouth daily with breakfast. 90 tablet 2   OVER THE COUNTER MEDICATION Take 1 tablet by mouth every 6 (six) hours as needed (Feeling in stomach). Altoids     pantoprazole  (PROTONIX ) 40 MG tablet TAKE 1 TABLET BY MOUTH EVERY DAY 90 tablet 3   repaglinide  (PRANDIN ) 1 MG tablet TAKE 1 TABLET BY MOUTH 3 TIMES DAILY BEFORE MEAL(S) 270 tablet 3   traZODone  (DESYREL ) 50 MG tablet Take 0.5-1 tablets (25-50 mg total) by mouth at bedtime as needed for sleep. 30 tablet 5   triamcinolone  cream (KENALOG ) 0.1 % APPLY TO AFFECTED AREA TWICE A DAY 30 g 5  omalizumab  (XOLAIR ) 150 MG/ML prefilled syringe Inject 150 mg into the skin.     Facility-Administered Medications Prior to Visit  Medication Dose Route Frequency Provider Last Rate Last Admin   dupilumab  (DUPIXENT ) prefilled syringe 300 mg  300 mg Subcutaneous Q14 Days Sean Czar, MD   300 mg at 01/24/24 0840    ROS: Review of Systems  Constitutional:  Positive for unexpected weight change. Negative for activity change, appetite change, chills and fatigue.  HENT:  Negative for congestion, mouth sores and sinus pressure.   Eyes:  Negative for visual disturbance.  Respiratory:  Negative for cough and chest tightness.   Gastrointestinal:  Negative for abdominal pain and nausea.   Genitourinary:  Negative for difficulty urinating, frequency and vaginal pain.  Musculoskeletal:  Negative for back pain and gait problem.  Skin:  Negative for pallor and rash.  Neurological:  Negative for dizziness, tremors, weakness, numbness and headaches.  Psychiatric/Behavioral:  Negative for confusion and sleep disturbance.     Objective:  BP 120/70   Pulse 81   Temp 98.3 F (36.8 C) (Oral)   Ht 4' 10 (1.473 m)   Wt 148 lb (67.1 kg)   SpO2 98%   BMI 30.93 kg/m   BP Readings from Last 3 Encounters:  02/02/24 120/70  11/02/23 120/68  09/27/23 130/84    Wt Readings from Last 3 Encounters:  02/02/24 148 lb (67.1 kg)  11/02/23 140 lb (63.5 kg)  09/27/23 146 lb 12.8 oz (66.6 kg)    Physical Exam Constitutional:      General: She is not in acute distress.    Appearance: She is well-developed. She is obese.  HENT:     Head: Normocephalic.     Right Ear: External ear normal.     Left Ear: External ear normal.     Nose: Nose normal.  Eyes:     General:        Right eye: No discharge.        Left eye: No discharge.     Conjunctiva/sclera: Conjunctivae normal.     Pupils: Pupils are equal, round, and reactive to light.  Neck:     Thyroid : No thyromegaly.     Vascular: No JVD.     Trachea: No tracheal deviation.  Cardiovascular:     Rate and Rhythm: Normal rate and regular rhythm.     Heart sounds: Normal heart sounds.  Pulmonary:     Effort: No respiratory distress.     Breath sounds: No stridor. No wheezing.  Abdominal:     General: Bowel sounds are normal. There is no distension.     Palpations: Abdomen is soft. There is no mass.     Tenderness: There is no abdominal tenderness. There is no guarding or rebound.  Musculoskeletal:        General: No tenderness.     Cervical back: Normal range of motion and neck supple. No rigidity.     Right lower leg: No edema.     Left lower leg: No edema.  Lymphadenopathy:     Cervical: No cervical adenopathy.   Skin:    Findings: No erythema or rash.  Neurological:     Mental Status: She is oriented to person, place, and time.     Cranial Nerves: No cranial nerve deficit.     Motor: No abnormal muscle tone.     Coordination: Coordination normal.     Deep Tendon Reflexes: Reflexes normal.  Psychiatric:  Behavior: Behavior normal.        Thought Content: Thought content normal.        Judgment: Judgment normal.     Lab Results  Component Value Date   WBC 6.2 05/03/2023   HGB 13.0 05/03/2023   HCT 40.2 05/03/2023   PLT 442.0 (H) 05/03/2023   GLUCOSE 101 (H) 11/02/2023   CHOL 170 05/03/2023   TRIG 69.0 05/03/2023   HDL 46.10 05/03/2023   LDLCALC 110 (H) 05/03/2023   ALT 15 11/02/2023   AST 14 11/02/2023   NA 140 11/02/2023   K 4.0 11/02/2023   CL 101 11/02/2023   CREATININE 0.73 11/02/2023   BUN 11 11/02/2023   CO2 28 11/02/2023   TSH 1.91 05/03/2023   HGBA1C 7.3 (H) 11/02/2023   MICROALBUR <0.7 05/03/2023    MM 3D DIAGNOSTIC MAMMOGRAM UNILATERAL RIGHT BREAST Addendum Date: 10/05/2023 ADDENDUM REPORT: 10/05/2023 10:59 ADDENDUM: An addendum is made to the technique section of this exam due to an omission. This should include: EXAM: DIGITAL DIAGNOSTIC UNILATERAL RIGHT MAMMOGRAM WITH TOMOSYNTHESIS AND CAD; RIGHT BREAST ULTRASOUND Electronically Signed   By: Alger Infield M.D.   On: 10/05/2023 10:59   Result Date: 10/05/2023 CLINICAL DATA:  Screening recall for possible right breast mass. EXAM: DIGITAL DIAGNOSTIC UNILATERAL RIGHT MAMMOGRAM WITH TOMOSYNTHESIS AND CAD TECHNIQUE: Right digital diagnostic mammography and breast tomosynthesis was performed. The images were evaluated with computer-aided detection. COMPARISON:  Previous exam(s). ACR Breast Density Category b: There are scattered areas of fibroglandular density. FINDINGS: Spot compression tomograms were performed of the right breast. There is a low-density oval mass with possible lucent center measuring 0.4 cm. Targeted  ultrasound of the right breast was performed. There is an oval circumscribed mass in the right breast at 6 o'clock retroareolar which is peripherally hypoechoic and hyperechoic centrally. This measures 0.4 x 0.2 x 0.3 cm, possibly an intramammary lymph node. This is felt to correspond with mammography findings. Normal lymph nodes are present in the right axilla. IMPRESSION: Probably benign right breast mass. RECOMMENDATION: Recommend six-month follow-up diagnostic mammography of the right breast with possible ultrasound. I have discussed the findings and recommendations with the patient. If applicable, a reminder letter will be sent to the patient regarding the next appointment. BI-RADS CATEGORY  3: Probably benign. Electronically Signed: By: Alger Infield M.D. On: 09/09/2023 12:00   [place]  LIMITED ULTRASOUND INCLUDING AXILLA RIGHT BREAST Addendum Date: 10/05/2023 ADDENDUM REPORT: 10/05/2023 10:59 ADDENDUM: An addendum is made to the technique section of this exam due to an omission. This should include: EXAM: DIGITAL DIAGNOSTIC UNILATERAL RIGHT MAMMOGRAM WITH TOMOSYNTHESIS AND CAD; RIGHT BREAST ULTRASOUND Electronically Signed   By: Alger Infield M.D.   On: 10/05/2023 10:59   Result Date: 10/05/2023 CLINICAL DATA:  Screening recall for possible right breast mass. EXAM: DIGITAL DIAGNOSTIC UNILATERAL RIGHT MAMMOGRAM WITH TOMOSYNTHESIS AND CAD TECHNIQUE: Right digital diagnostic mammography and breast tomosynthesis was performed. The images were evaluated with computer-aided detection. COMPARISON:  Previous exam(s). ACR Breast Density Category b: There are scattered areas of fibroglandular density. FINDINGS: Spot compression tomograms were performed of the right breast. There is a low-density oval mass with possible lucent center measuring 0.4 cm. Targeted ultrasound of the right breast was performed. There is an oval circumscribed mass in the right breast at 6 o'clock retroareolar which is peripherally  hypoechoic and hyperechoic centrally. This measures 0.4 x 0.2 x 0.3 cm, possibly an intramammary lymph node. This is felt to correspond with mammography findings. Normal lymph nodes  are present in the right axilla. IMPRESSION: Probably benign right breast mass. RECOMMENDATION: Recommend six-month follow-up diagnostic mammography of the right breast with possible ultrasound. I have discussed the findings and recommendations with the patient. If applicable, a reminder letter will be sent to the patient regarding the next appointment. BI-RADS CATEGORY  3: Probably benign. Electronically Signed: By: Alger Infield M.D. On: 09/09/2023 12:00    Assessment & Plan:   Problem List Items Addressed This Visit     Type 2 diabetes mellitus (HCC) - Primary   Start Mounjaro Risks, benefits discussed      Relevant Medications   tirzepatide (MOUNJARO) 5 MG/0.5ML Pen   Other Relevant Orders   Comprehensive metabolic panel with GFR   Hemoglobin A1c   Hypertension   Doing better BP Readings from Last 3 Encounters:  02/02/24 120/70  11/02/23 120/68  09/27/23 130/84         Gout   No relapse      Urticaria   On Dupixent  shots now         Meds ordered this encounter  Medications   tirzepatide (MOUNJARO) 5 MG/0.5ML Pen    Sig: Inject 5 mg into the skin once a week.    Dispense:  2 mL    Refill:  3      Follow-up: Return in about 4 months (around 06/03/2024) for a follow-up visit.  Anitra Barn, MD

## 2024-02-02 NOTE — Assessment & Plan Note (Signed)
 Start Mounjaro Risks, benefits discussed

## 2024-02-02 NOTE — Assessment & Plan Note (Signed)
No relapse 

## 2024-02-02 NOTE — Addendum Note (Signed)
 Addended by: Delayne Feather on: 02/02/2024 08:31 AM   Modules accepted: Orders

## 2024-02-02 NOTE — Assessment & Plan Note (Signed)
 On Dupixent  shots now

## 2024-02-02 NOTE — Addendum Note (Signed)
 Addended by: Delayne Feather on: 02/02/2024 08:46 AM   Modules accepted: Orders

## 2024-02-02 NOTE — Assessment & Plan Note (Signed)
 Doing better BP Readings from Last 3 Encounters:  02/02/24 120/70  11/02/23 120/68  09/27/23 130/84

## 2024-02-04 ENCOUNTER — Ambulatory Visit: Payer: Self-pay | Admitting: Internal Medicine

## 2024-02-07 ENCOUNTER — Ambulatory Visit (INDEPENDENT_AMBULATORY_CARE_PROVIDER_SITE_OTHER)

## 2024-02-07 DIAGNOSIS — L209 Atopic dermatitis, unspecified: Secondary | ICD-10-CM | POA: Diagnosis not present

## 2024-02-15 ENCOUNTER — Telehealth: Payer: Self-pay

## 2024-02-15 ENCOUNTER — Other Ambulatory Visit (HOSPITAL_COMMUNITY): Payer: Self-pay

## 2024-02-15 NOTE — Telephone Encounter (Signed)
 Pharmacy Patient Advocate Encounter   Received notification from Onbase that prior authorization for Mounjaro  5MG /0.5ML auto-injectors is required/requested.   Insurance verification completed.   The patient is insured through [facility] ADVANTAGE/RX ADVANCE .   Per test claim: PA required; PA submitted to above mentioned insurance via CoverMyMeds Key/confirmation #/EOC [id number] Status is pending

## 2024-02-16 NOTE — Telephone Encounter (Signed)
 Waiting on fax from insurance. Patient initiated a PA and no documents were submitted. Insurance automatically denied.

## 2024-02-17 ENCOUNTER — Telehealth: Payer: Self-pay

## 2024-02-17 ENCOUNTER — Other Ambulatory Visit (HOSPITAL_COMMUNITY): Payer: Self-pay

## 2024-02-17 NOTE — Telephone Encounter (Addendum)
 Appeal has been submitted. Will advise when response is received or follow up in 1 week. Please be advised that most companies may take 30 days to make a decision. Determination should be received by 02/24/24  Case ID: [id number]  Phone# [phone] Fax# [phone]

## 2024-02-17 NOTE — Telephone Encounter (Signed)
 Pharmacy Patient Advocate Encounter  Received notification from [employer] that Prior Authorization for Mounjaro  5mg /0.42ml has been DENIED.  Due to did not receive clinical information in a timely manner which resulted in a denial.    PA #/Case ID/Reference #: [id number]

## 2024-02-18 ENCOUNTER — Other Ambulatory Visit (HOSPITAL_COMMUNITY): Payer: Self-pay

## 2024-02-21 ENCOUNTER — Other Ambulatory Visit (HOSPITAL_COMMUNITY): Payer: Self-pay

## 2024-02-21 NOTE — Telephone Encounter (Signed)
 Pharmacy Patient Advocate Encounter  Received notification from [employer] that Prior Authorization for Mounjaro  has been APPROVED from 02/18/24 to 02/16/25. Unable to obtain price due to refill too soon rejection, last fill date 02/02/24 next available fill date07/2/25   PA #/Case ID/Reference #: [id number]

## 2024-02-22 ENCOUNTER — Ambulatory Visit

## 2024-02-22 DIAGNOSIS — L209 Atopic dermatitis, unspecified: Secondary | ICD-10-CM

## 2024-02-29 ENCOUNTER — Other Ambulatory Visit: Payer: Self-pay | Admitting: Internal Medicine

## 2024-03-03 ENCOUNTER — Other Ambulatory Visit: Payer: Self-pay | Admitting: Internal Medicine

## 2024-03-03 NOTE — Telephone Encounter (Signed)
 Copied from CRM [id number]. Topic: Clinical - Medication Refill >> Mar 03, 2024  9:27 AM Robinson H wrote: Medication: meloxicam  (MOBIC ) 15 MG tablet   Has the patient contacted their pharmacy? Yes, rx epired (Agent: If no, request that the patient contact the pharmacy for the refill. If patient does not wish to contact the pharmacy document the reason why and proceed with request.) (Agent: If yes, when and what did the pharmacy advise?)  This is the patient's preferred pharmacy:  [facility] [address] [place] - [address] [place] [address] Phone: [phone] Fax: [phone]  Is this the correct pharmacy for this prescription? Yes If no, delete pharmacy and type the correct one.   Has the prescription been filled recently? No  Is the patient out of the medication? No  Has the patient been seen for an appointment in the last year OR does the patient have an upcoming appointment? Yes  Can we respond through MyChart? Yes  Agent: Please be advised that Rx refills may take up to 3 business days. We ask that you follow-up with your pharmacy.

## 2024-03-07 ENCOUNTER — Ambulatory Visit

## 2024-03-07 DIAGNOSIS — L209 Atopic dermatitis, unspecified: Secondary | ICD-10-CM

## 2024-03-21 ENCOUNTER — Ambulatory Visit (INDEPENDENT_AMBULATORY_CARE_PROVIDER_SITE_OTHER)

## 2024-03-21 DIAGNOSIS — L209 Atopic dermatitis, unspecified: Secondary | ICD-10-CM

## 2024-03-23 ENCOUNTER — Other Ambulatory Visit: Payer: Self-pay

## 2024-03-23 ENCOUNTER — Telehealth: Payer: Self-pay

## 2024-03-23 DIAGNOSIS — E119 Type 2 diabetes mellitus without complications: Secondary | ICD-10-CM

## 2024-03-23 MED ORDER — ACCU-CHEK SOFTCLIX LANCETS MISC
3 refills | Status: DC
Start: 2024-03-23 — End: 2024-03-28

## 2024-03-23 NOTE — Telephone Encounter (Signed)
 Copied from CRM [id number]. Topic: Clinical - Medication Question >> Mar 23, 2024 12:18 PM Franky GRADE wrote: Reason for CRM: Patient is calling because [facility] pharmacy placed a request for Needles for her Lantus; however, she was informed it was denied and she would like to know why. I could not find the request on file.

## 2024-03-23 NOTE — Telephone Encounter (Signed)
 Lancets were sent to pts pharmacy.

## 2024-03-24 ENCOUNTER — Other Ambulatory Visit (HOSPITAL_COMMUNITY): Payer: Self-pay

## 2024-03-24 ENCOUNTER — Telehealth: Payer: Self-pay

## 2024-03-24 NOTE — Telephone Encounter (Signed)
 Pharmacy Patient Advocate Encounter   Received notification from Onbase that prior authorization for AccuChek Softclix lancets is required/requested.   Insurance verification completed.   The patient is insured through [facility] ADVANTAGE/RX ADVANCE . Patient meter is OneTouch   Per test claim:  One Touch is preferred by the insurance.  If suggested medication is appropriate, Please send in a new RX and discontinue this one. If not, please advise as to why it's not appropriate so that we may request a Prior Authorization. Please note, some preferred medications may still require a PA.  If the suggested medications have not been trialed and there are no contraindications to their use, the PA will not be submitted, as it will not be approved.   Ran test claim for One Touch Delica Plus lancets and copay is $0.00

## 2024-03-27 ENCOUNTER — Ambulatory Visit

## 2024-03-27 ENCOUNTER — Ambulatory Visit
Admission: RE | Admit: 2024-03-27 | Discharge: 2024-03-27 | Disposition: A | Discharge status: Home / Self Care | Source: Ambulatory Visit | Attending: Obstetrics and Gynecology | Admitting: Obstetrics and Gynecology

## 2024-03-27 DIAGNOSIS — R928 Other abnormal and inconclusive findings on diagnostic imaging of breast: Secondary | ICD-10-CM | POA: Diagnosis not present

## 2024-03-27 DIAGNOSIS — N6310 Unspecified lump in the right breast, unspecified quadrant: Secondary | ICD-10-CM

## 2024-03-28 ENCOUNTER — Other Ambulatory Visit (HOSPITAL_COMMUNITY): Payer: Self-pay

## 2024-03-28 ENCOUNTER — Other Ambulatory Visit: Payer: Self-pay

## 2024-03-28 DIAGNOSIS — E119 Type 2 diabetes mellitus without complications: Secondary | ICD-10-CM

## 2024-03-28 MED ORDER — ONETOUCH ULTRASOFT 2 LANCETS MISC: 1.0000 | Freq: Three times a day (TID) | 8 refills | Status: AC

## 2024-03-28 NOTE — Telephone Encounter (Unsigned)
 Copied from CRM [id number]. Topic: Clinical - Prescription Issue >> Mar 28, 2024 11:30 AM Rea BROCKS wrote: Reason for CRM: Patient called in because she is still receiving Accucheck lancets.   But her insurance approves One Touch Delica Plus lancets and would like a script sent to her pharmacy for those, instead of Accu-check due to insurance changes and new approval for OneTouch.   [facility] [address], [place] - [address] [place] [address] Phone: [phone] Fax: [phone] Hours: Not open 24 hours  [phone] (H)

## 2024-04-04 ENCOUNTER — Ambulatory Visit

## 2024-04-04 DIAGNOSIS — L209 Atopic dermatitis, unspecified: Secondary | ICD-10-CM

## 2024-04-18 ENCOUNTER — Ambulatory Visit (INDEPENDENT_AMBULATORY_CARE_PROVIDER_SITE_OTHER)

## 2024-04-18 DIAGNOSIS — L209 Atopic dermatitis, unspecified: Secondary | ICD-10-CM

## 2024-04-24 ENCOUNTER — Other Ambulatory Visit: Payer: Self-pay | Admitting: Internal Medicine

## 2024-05-03 ENCOUNTER — Ambulatory Visit (INDEPENDENT_AMBULATORY_CARE_PROVIDER_SITE_OTHER)

## 2024-05-03 DIAGNOSIS — L209 Atopic dermatitis, unspecified: Secondary | ICD-10-CM

## 2024-05-10 ENCOUNTER — Other Ambulatory Visit: Payer: Self-pay | Admitting: Internal Medicine

## 2024-05-17 ENCOUNTER — Ambulatory Visit

## 2024-05-17 DIAGNOSIS — L209 Atopic dermatitis, unspecified: Secondary | ICD-10-CM | POA: Diagnosis not present

## 2024-05-20 ENCOUNTER — Other Ambulatory Visit: Payer: Self-pay | Admitting: Internal Medicine

## 2024-05-31 ENCOUNTER — Ambulatory Visit (INDEPENDENT_AMBULATORY_CARE_PROVIDER_SITE_OTHER)

## 2024-05-31 DIAGNOSIS — L209 Atopic dermatitis, unspecified: Secondary | ICD-10-CM | POA: Diagnosis not present

## 2024-06-05 ENCOUNTER — Ambulatory Visit: Admitting: Internal Medicine

## 2024-06-05 ENCOUNTER — Encounter: Payer: Self-pay | Admitting: Internal Medicine

## 2024-06-05 VITALS — BP 122/82 | HR 85 | Temp 97.9°F | Ht <= 58 in | Wt 133.2 lb

## 2024-06-05 DIAGNOSIS — I10 Essential (primary) hypertension: Secondary | ICD-10-CM | POA: Diagnosis not present

## 2024-06-05 DIAGNOSIS — M7062 Trochanteric bursitis, left hip: Secondary | ICD-10-CM | POA: Diagnosis not present

## 2024-06-05 DIAGNOSIS — Z7985 Long-term (current) use of injectable non-insulin antidiabetic drugs: Secondary | ICD-10-CM | POA: Diagnosis not present

## 2024-06-05 DIAGNOSIS — E119 Type 2 diabetes mellitus without complications: Secondary | ICD-10-CM | POA: Diagnosis not present

## 2024-06-05 LAB — BASIC METABOLIC PANEL WITH GFR
BUN: 10 mg/dL (ref 6–23)
CO2: 27 meq/L (ref 19–32)
Calcium: 9.7 mg/dL (ref 8.4–10.5)
Chloride: 102 meq/L (ref 96–112)
Creatinine, Ser: 0.99 mg/dL (ref 0.40–1.20)
GFR: 58.06 mL/min — ABNORMAL LOW (ref >60.00)
Glucose, Bld: 55 mg/dL — ABNORMAL LOW (ref 70–99)
Potassium: 3.9 meq/L (ref 3.5–5.1)
Sodium: 139 meq/L (ref 135–145)

## 2024-06-05 LAB — HEMOGLOBIN A1C: Hgb A1c MFr Bld: 6.3 % (ref 4.6–6.5)

## 2024-06-05 NOTE — Assessment & Plan Note (Signed)
 On Mounjaro  Doing well on 5 mg/wk

## 2024-06-05 NOTE — Progress Notes (Signed)
 Subjective:  Patient ID: Renee Mann, female    DOB: 1955/10/25  Age: 68 y.o. MRN: [id number]  CC: Medical Management of Chronic Issues (4 Month follow up. Notes some left flank pain when laying down, pain radiating into left leg. Also having some muscle spasms in left quadricept )   HPI Renee Mann presents for HTN, DM. Pt lost 15 lbs. F/u on DM C/o OA  Outpatient Medications Prior to Visit  Medication Sig Dispense Refill   amLODipine  (NORVASC ) 5 MG tablet TAKE 1 TABLET BY MOUTH EVERY DAY 90 tablet 3   Blood Glucose Monitoring Suppl DEVI 1 each by Does not apply route in the morning, at noon, and at bedtime. May substitute to any manufacturer covered by patient's insurance (ONE [facility]). Dx code: E11.9 1 each 0   Cholecalciferol (VITAMIN D3) 50 MCG (2000 UT) capsule Take 1 capsule (2,000 Units total) by mouth daily. (Patient taking differently: Take 5,000 Units by mouth daily.) 100 capsule 3   clotrimazole  (LOTRIMIN ) 1 % cream APPLY 1 APPLICATION TOPICALLY IN THE MORNING AND AT BEDTIME. USE UNTIL RASH DISAPPEARS AND THEN ANOTHER 3 DAYS. (Patient taking differently: Apply 1 Application topically daily as needed (Itching).) 30 g 3   cyclobenzaprine  (FLEXERIL ) 5 MG tablet Take 1 tablet (5 mg total) by mouth at bedtime. 30 tablet 2   diphenhydrAMINE  (BENADRYL ) 25 MG tablet Take 25 mg by mouth every 6 (six) hours as needed for allergies.     dupilumab  (DUPIXENT ) 300 MG/2ML prefilled syringe Inject 300 mg into the skin every 14 (fourteen) days. 12 mL 3   gabapentin  (NEURONTIN ) 300 MG capsule Take 300 mg by mouth 3 (three) times daily.     hydrocortisone  2.5 % ointment Apply topically twice daily as need to red sandpapery rash. 30 g 3   hyoscyamine  (LEVSIN  SL) 0.125 MG SL tablet Place 1 tablet (0.125 mg total) under the tongue every 6 (six) hours as needed for cramping (nausea, AB pain). 30 tablet 0   ketoconazole  (NIZORAL ) 2 % cream Apply 1 Application topically daily as needed (rash). 30 g 0    levocetirizine (XYZAL ) 5 MG tablet Take 1 tablet by mouth 2 (two) times daily.     meloxicam  (MOBIC ) 15 MG tablet TAKE 1 TABLET BY MOUTH ONCE DAILY AS NEEDED FOR PAIN 30 tablet 1   metFORMIN  (GLUCOPHAGE -XR) 750 MG 24 hr tablet Take 1 tablet (750 mg total) by mouth daily with breakfast. 90 tablet 2   OneTouch UltraSoft 2 Lancets MISC 1 each by Does not apply route in the morning, at noon, and at bedtime. Use 3x a day to check blood sugars daily. DX CODE: E11.9  (ONE TOUCH DELICA) 150 each 8   OVER THE COUNTER MEDICATION Take 1 tablet by mouth every 6 (six) hours as needed (Feeling in stomach). Altoids     pantoprazole  (PROTONIX ) 40 MG tablet TAKE 1 TABLET BY MOUTH EVERY DAY 90 tablet 3   repaglinide  (PRANDIN ) 1 MG tablet TAKE 1 TABLET BY MOUTH 3 TIMES DAILY BEFORE MEAL(S) 270 tablet 3   tirzepatide  (MOUNJARO ) 5 MG/0.5ML Pen INJECT 5 MG INTO THE SKIN ONCE A WEEK 2 mL 5   traZODone  (DESYREL ) 50 MG tablet Take 0.5-1 tablets (25-50 mg total) by mouth at bedtime as needed for sleep. 30 tablet 5   triamcinolone  cream (KENALOG ) 0.1 % APPLY TO AFFECTED AREA TWICE A DAY 30 g 5   Facility-Administered Medications Prior to Visit  Medication Dose Route Frequency Provider Last Rate Last  Admin   dupilumab  (DUPIXENT ) prefilled syringe 300 mg  300 mg Subcutaneous Q14 Days Marinda Rocky SAILOR, MD   300 mg at 05/31/24 0831    ROS: Review of Systems  Constitutional:  Negative for activity change, appetite change, chills, fatigue and unexpected weight change.  HENT:  Negative for congestion, mouth sores and sinus pressure.   Eyes:  Negative for visual disturbance.  Respiratory:  Negative for cough and chest tightness.   Gastrointestinal:  Negative for abdominal pain and nausea.  Genitourinary:  Negative for difficulty urinating, frequency and vaginal pain.  Musculoskeletal:  Positive for arthralgias and back pain. Negative for gait problem.  Skin:  Negative for pallor and rash.  Neurological:  Negative for  dizziness, tremors, weakness, numbness and headaches.  Psychiatric/Behavioral:  Negative for confusion and sleep disturbance.     Objective:  BP 122/82   Pulse 85   Temp 97.9 F (36.6 C)   Ht 4' 10 (1.473 m)   Wt 133 lb 3.2 oz (60.4 kg)   SpO2 99%   BMI 27.84 kg/m   BP Readings from Last 3 Encounters:  06/05/24 122/82  02/02/24 120/70  11/02/23 120/68    Wt Readings from Last 3 Encounters:  06/05/24 133 lb 3.2 oz (60.4 kg)  02/02/24 148 lb (67.1 kg)  11/02/23 140 lb (63.5 kg)    Physical Exam Constitutional:      General: She is not in acute distress.    Appearance: She is well-developed. She is obese.  HENT:     Head: Normocephalic.     Right Ear: External ear normal.     Left Ear: External ear normal.     Nose: Nose normal.  Eyes:     General:        Right eye: No discharge.        Left eye: No discharge.     Conjunctiva/sclera: Conjunctivae normal.     Pupils: Pupils are equal, round, and reactive to light.  Neck:     Thyroid : No thyromegaly.     Vascular: No JVD.     Trachea: No tracheal deviation.  Cardiovascular:     Rate and Rhythm: Normal rate and regular rhythm.     Heart sounds: Normal heart sounds.  Pulmonary:     Effort: No respiratory distress.     Breath sounds: No stridor. No wheezing.  Abdominal:     General: Bowel sounds are normal. There is no distension.     Palpations: Abdomen is soft. There is no mass.     Tenderness: There is no abdominal tenderness. There is no guarding or rebound.  Musculoskeletal:        General: No tenderness.     Cervical back: Normal range of motion and neck supple. No rigidity.  Lymphadenopathy:     Cervical: No cervical adenopathy.  Skin:    Findings: No erythema or rash.  Neurological:     Cranial Nerves: No cranial nerve deficit.     Motor: No abnormal muscle tone.     Coordination: Coordination normal.     Deep Tendon Reflexes: Reflexes normal.  Psychiatric:        Behavior: Behavior normal.         Thought Content: Thought content normal.        Judgment: Judgment normal.   L lat hip w/pain; str leg (-) B; hips w/full ROM  Lab Results  Component Value Date   WBC 6.2 05/03/2023   HGB 13.0 05/03/2023  HCT 40.2 05/03/2023   PLT 442.0 (H) 05/03/2023   GLUCOSE 129 (H) 02/02/2024   CHOL 170 05/03/2023   TRIG 69.0 05/03/2023   HDL 46.10 05/03/2023   LDLCALC 110 (H) 05/03/2023   ALT 12 02/02/2024   AST 13 02/02/2024   NA 140 02/02/2024   K 4.3 02/02/2024   CL 104 02/02/2024   CREATININE 0.81 02/02/2024   BUN 10 02/02/2024   CO2 28 02/02/2024   TSH 1.91 05/03/2023   HGBA1C 7.4 (H) 02/02/2024    MM 3D DIAGNOSTIC MAMMOGRAM UNILATERAL RIGHT BREAST Result Date: 03/27/2024 CLINICAL DATA:  68 year old female presenting for short-term follow-up of a probably benign right breast mass. EXAM: DIGITAL DIAGNOSTIC UNILATERAL RIGHT MAMMOGRAM WITH TOMOSYNTHESIS AND CAD TECHNIQUE: Right digital diagnostic mammography and breast tomosynthesis was performed. The images were evaluated with computer-aided detection. COMPARISON:  Previous exam(s). ACR Breast Density Category b: There are scattered areas of fibroglandular density. FINDINGS: Full field tomosynthesis views of the right breast were performed. There is a stable small obscured mass in the central inferior right breast best seen on CC view. No new suspicious findings elsewhere in the right breast. IMPRESSION: Stable probably benign mass in the central inferior right breast. RECOMMENDATION: Diagnostic bilateral mammogram and possible right breast ultrasound in 6 months. I have discussed the findings and recommendations with the patient. If applicable, a reminder letter will be sent to the patient regarding the next appointment. BI-RADS CATEGORY  3: Probably benign. Electronically Signed   By: Inocente Ast M.D.   On: 03/27/2024 11:06    Assessment & Plan:   Problem List Items Addressed This Visit     HTN (hypertension)   Doing better On  Norvasc  BP Readings from Last 3 Encounters:  06/05/24 122/82  02/02/24 120/70  11/02/23 120/68         Trochanteric bursitis - Primary    L side Meloxicam  prn X ray, Sports med offered      Relevant Orders   Basic metabolic panel with GFR   Type 2 diabetes mellitus (HCC)   On Mounjaro  Doing well on 5 mg/wk      Relevant Orders   Basic metabolic panel with GFR   Hemoglobin A1c      No orders of the defined types were placed in this encounter.     Follow-up: Return in about 3 months (around 09/05/2024) for a follow-up visit.  Marolyn Noel, MD

## 2024-06-05 NOTE — Assessment & Plan Note (Signed)
 L side Meloxicam  prn X ray, Sports med offered

## 2024-06-05 NOTE — Assessment & Plan Note (Signed)
 Doing better On Norvasc  BP Readings from Last 3 Encounters:  06/05/24 122/82  02/02/24 120/70  11/02/23 120/68

## 2024-06-11 ENCOUNTER — Ambulatory Visit: Payer: Self-pay | Admitting: Internal Medicine

## 2024-06-13 ENCOUNTER — Ambulatory Visit: Payer: Medicare Other

## 2024-06-13 VITALS — Ht <= 58 in | Wt 133.0 lb

## 2024-06-13 DIAGNOSIS — Z0000 Encounter for general adult medical examination without abnormal findings: Secondary | ICD-10-CM | POA: Diagnosis not present

## 2024-06-13 DIAGNOSIS — E119 Type 2 diabetes mellitus without complications: Secondary | ICD-10-CM

## 2024-06-13 NOTE — Patient Instructions (Addendum)
 Renee Mann,  Thank you for taking the time for your Medicare Wellness Visit. I appreciate your continued commitment to your health goals. Please review the care plan we discussed, and feel free to reach out if I can assist you further.  Medicare recommends these wellness visits once per year to help you and your care team stay ahead of potential health issues. These visits are designed to focus on prevention, allowing your provider to concentrate on managing your acute and chronic conditions during your regular appointments.  Please note that Annual Wellness Visits do not include a physical exam. Some assessments may be limited, especially if the visit was conducted virtually. If needed, we may recommend a separate in-person follow-up with your provider.  Ongoing Care Seeing your primary care provider every 3 to 6 months helps us  monitor your health and provide consistent, personalized care.   Referrals If a referral was made during today's visit and you haven't received any updates within two weeks, please contact the referred provider directly to check on the status.  Recommended Screenings:  Health Maintenance  Topic Date Due   Yearly kidney health urinalysis for diabetes  Never done   Complete foot exam   09/16/2021   Hemoglobin A1C  12/04/2024   Eye exam for diabetics  12/26/2024   DTaP/Tdap/Td vaccine (2 - Td or Tdap) 03/19/2025   Breast Cancer Screening  03/27/2025   Yearly kidney function blood test for diabetes  06/05/2025   Medicare Annual Wellness Visit  06/13/2025   Colon Cancer Screening  06/29/2026   Pneumococcal Vaccine for age over 80  Completed   DEXA scan (bone density measurement)  Completed   Hepatitis C Screening  Completed   Meningitis B Vaccine  Aged Out   Flu Shot  Discontinued   COVID-19 Vaccine  Discontinued   Zoster (Shingles) Vaccine  Discontinued       06/13/2024    8:11 AM  Advanced Directives  Does Patient Have a Medical Advance Directive? Yes   Type of Estate agent of [place];Living will  Copy of Healthcare Power of Attorney in Chart? No - copy requested   Advance Care Planning is important because it: Ensures you receive medical care that aligns with your values, goals, and preferences. Provides guidance to your family and loved ones, reducing the emotional burden of decision-making during critical moments.  Vision: Annual vision screenings are recommended for early detection of glaucoma, cataracts, and diabetic retinopathy. These exams can also reveal signs of chronic conditions such as diabetes and high blood pressure.  Dental: Annual dental screenings help detect early signs of oral cancer, gum disease, and other conditions linked to overall health, including heart disease and diabetes.

## 2024-06-13 NOTE — Progress Notes (Cosign Needed Addendum)
 Subjective:   Renee Mann is a 68 y.o. who presents for a Medicare Wellness preventive visit.  As a reminder, Annual Wellness Visits don't include a physical exam, and some assessments may be limited, especially if this visit is performed virtually. We may recommend an in-person follow-up visit with your provider if needed.  Visit Complete: Virtual I connected with  Katharina A Kildow on 06/13/24 by a audio enabled telemedicine application and verified that I am speaking with the correct person using two identifiers.  Patient Location: Home  Provider Location: Office/Clinic  I discussed the limitations of evaluation and management by telemedicine. The patient expressed understanding and agreed to proceed.  Vital Signs: Because this visit was a virtual/telehealth visit, some criteria may be missing or patient reported. Any vitals not documented were not able to be obtained and vitals that have been documented are patient reported.  VideoDeclined- This patient declined Librarian, academic. Therefore the visit was completed with audio only.  Persons Participating in Visit: Patient.  AWV Questionnaire: Yes: Patient Medicare AWV questionnaire was completed by the patient on 06/06/2024; I have confirmed that all information answered by patient is correct and no changes since this date.  Cardiac Risk Factors include: advanced age (>34men, >74 women);diabetes mellitus;hypertension     Objective:    Today's Vitals   06/13/24 0812  Weight: 133 lb (60.3 kg)  Height: 4' 10 (1.473 m)   Body mass index is 27.8 kg/m.     06/13/2024    8:11 AM 06/11/2023    8:24 AM 12/16/2022    8:05 AM 06/08/2022    8:26 AM 12/25/2021    8:24 PM 10/27/2019    4:23 PM 12/05/2016    5:29 PM  Advanced Directives  Does Patient Have a Medical Advance Directive? Yes No No No No No No   Type of Estate Agent of [place];Living will        Copy of Healthcare Power of  Attorney in Chart? No - copy requested        Would patient like information on creating a medical advance directive?    No - Patient declined        Data saved with a previous flowsheet row definition    Current Medications (verified) Outpatient Encounter Medications as of 06/13/2024  Medication Sig   amLODipine  (NORVASC ) 5 MG tablet TAKE 1 TABLET BY MOUTH EVERY DAY   Blood Glucose Monitoring Suppl DEVI 1 each by Does not apply route in the morning, at noon, and at bedtime. May substitute to any manufacturer covered by patient's insurance ([facility]). Dx code: E11.9   Cholecalciferol (VITAMIN D3) 50 MCG (2000 UT) capsule Take 1 capsule (2,000 Units total) by mouth daily. (Patient taking differently: Take 5,000 Units by mouth daily.)   clotrimazole  (LOTRIMIN ) 1 % cream APPLY 1 APPLICATION TOPICALLY IN THE MORNING AND AT BEDTIME. USE UNTIL RASH DISAPPEARS AND THEN ANOTHER 3 DAYS. (Patient taking differently: Apply 1 Application topically daily as needed (Itching).)   cyclobenzaprine  (FLEXERIL ) 5 MG tablet Take 1 tablet (5 mg total) by mouth at bedtime.   diphenhydrAMINE  (BENADRYL ) 25 MG tablet Take 25 mg by mouth every 6 (six) hours as needed for allergies.   dupilumab  (DUPIXENT ) 300 MG/2ML prefilled syringe Inject 300 mg into the skin every 14 (fourteen) days.   gabapentin  (NEURONTIN ) 300 MG capsule Take 300 mg by mouth 3 (three) times daily.   hydrocortisone  2.5 % ointment Apply topically twice daily as need  to red sandpapery rash.   hyoscyamine  (LEVSIN  SL) 0.125 MG SL tablet Place 1 tablet (0.125 mg total) under the tongue every 6 (six) hours as needed for cramping (nausea, AB pain).   ketoconazole  (NIZORAL ) 2 % cream Apply 1 Application topically daily as needed (rash).   levocetirizine (XYZAL ) 5 MG tablet Take 1 tablet by mouth 2 (two) times daily.   meloxicam  (MOBIC ) 15 MG tablet TAKE 1 TABLET BY MOUTH ONCE DAILY AS NEEDED FOR PAIN   metFORMIN  (GLUCOPHAGE -XR) 750 MG 24 hr tablet Take 1  tablet (750 mg total) by mouth daily with breakfast.   OneTouch UltraSoft 2 Lancets MISC 1 each by Does not apply route in the morning, at noon, and at bedtime. Use 3x a day to check blood sugars daily. DX CODE: E11.9  (ONE TOUCH DELICA)   OVER THE COUNTER MEDICATION Take 1 tablet by mouth every 6 (six) hours as needed (Feeling in stomach). Altoids   pantoprazole  (PROTONIX ) 40 MG tablet TAKE 1 TABLET BY MOUTH EVERY DAY   repaglinide  (PRANDIN ) 1 MG tablet TAKE 1 TABLET BY MOUTH 3 TIMES DAILY BEFORE MEAL(S)   tirzepatide  (MOUNJARO ) 5 MG/0.5ML Pen INJECT 5 MG INTO THE SKIN ONCE A WEEK   triamcinolone  cream (KENALOG ) 0.1 % APPLY TO AFFECTED AREA TWICE A DAY   traZODone  (DESYREL ) 50 MG tablet Take 0.5-1 tablets (25-50 mg total) by mouth at bedtime as needed for sleep. 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   URI (upper respiratory infection) 11/28/2009   Past Surgical History:  Procedure Laterality Date   ABDOMINAL HYSTERECTOMY  1987   BIOPSY  12/16/2022   Procedure: BIOPSY;  Surgeon: Wilhelmenia Aloha Raddle., MD;  Location: [facility];  Service: Gastroenterology;;   burritis surgery Left 1998   hip   CATARACT EXTRACTION Bilateral 12/18/2022   CESAREAN SECTION  1978   ESOPHAGOGASTRODUODENOSCOPY (EGD) WITH PROPOFOL  N/A 12/16/2022   Procedure: ESOPHAGOGASTRODUODENOSCOPY (EGD) WITH PROPOFOL ;  Surgeon: Wilhelmenia Aloha Raddle., MD;  Location: [facility];  Service: Gastroenterology;  Laterality: N/A;   EUS N/A 12/16/2022   Procedure: UPPER ENDOSCOPIC  ULTRASOUND (EUS) RADIAL;  Surgeon: Wilhelmenia Aloha Raddle., MD;  Location: [facility];  Service: Gastroenterology;  Laterality: N/A;   Family History  Problem Relation Age of Onset   Hypertension Mother    Cancer Mother    Stroke Mother    Hypertension Sister    Diabetes Sister    Hypertension Other    Colon cancer Neg Hx    Esophageal cancer Neg Hx    Stomach cancer Neg Hx    Rectal cancer Neg Hx    Social History   Socioeconomic History   Marital status: Married    Spouse name: Camellia   Number of children: 1   Years of education: Not on file   Highest education level: 12th grade  Occupational History   Occupation: housewife    Comment: staying 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   Occupation: Retired  Tobacco Use   Smoking status: Former    Current packs/day: 0.00  Types: Cigarettes    Quit date: 08/24/1998    Years since quitting: 25.8   Smokeless tobacco: Never  Vaping Use   Vaping status: Never Used  Substance and Sexual Activity   Alcohol use: No    Alcohol/week: 0.0 standard drinks of alcohol   Drug use: No   Sexual activity: Yes  Other Topics Concern   Not on file  Social History Narrative   ** Merged History Encounter       Lives with husband.**    Social Drivers of Corporate Investment Banker Strain: Low Risk  (06/13/2024)   Overall Financial Resource Strain (CARDIA)    Difficulty of Paying Living Expenses: Not hard at all  Food Insecurity: No Food Insecurity (06/13/2024)   Hunger Vital Sign    Worried About Running Out of Food in the Last Year: Never true    Ran Out of Food in the Last Year: Never true  Transportation Needs: No Transportation Needs (06/13/2024)   PRAPARE - Administrator, Civil Service (Medical): No    Lack of Transportation (Non-Medical): No  Physical Activity: Sufficiently Active (06/13/2024)   Exercise Vital Sign    Days of Exercise per Week: 5 days    Minutes of Exercise per Session: 40 min  Stress: No Stress Concern Present  (06/13/2024)   [employer] of Occupational Health - Occupational Stress Questionnaire    Feeling of Stress: Not at all  Social Connections: Moderately Isolated (06/13/2024)   Social Connection and Isolation Panel    Frequency of Communication with Friends and Family: More than three times a week    Frequency of Social Gatherings with Friends and Family: Three times a week    Attends Religious Services: Never    Active Member of Clubs or Organizations: No    Attends Banker Meetings: Never    Marital Status: Married    Tobacco Counseling Counseling given: Not Answered    Clinical Intake:  Pre-visit preparation completed: Yes  Pain : No/denies pain     BMI - recorded: 27.8 Nutritional Status: BMI 25 -29 Overweight Nutritional Risks: None Diabetes: Yes CBG done?: Yes CBG resulted in Enter/ Edit results?: Yes (fasting - 109) Did pt. bring in CBG monitor from home?: No  Lab Results  Component Value Date   HGBA1C 6.3 06/05/2024   HGBA1C 7.4 (H) 02/02/2024   HGBA1C 7.3 (H) 11/02/2023     How often do you need to have someone help you when you read instructions, pamphlets, or other written materials from your doctor or pharmacy?: 1 - Never  Interpreter Needed?: No  Information entered by :: Verdie Saba, CMA   Activities of Daily Living     06/13/2024    8:16 AM 06/06/2024   10:44 AM  In your present state of health, do you have any difficulty performing the following activities:  Hearing? 0 0  Vision? 0 0  Difficulty concentrating or making decisions? 0 0  Walking or climbing stairs? 0 0  Dressing or bathing? 0 0  Doing errands, shopping? 0 0  Preparing Food and eating ? N N  Using the Toilet? N N  In the past six months, have you accidently leaked urine? N N  Do you have problems with loss of bowel control? N N  Managing your Medications? N N  Managing your Finances? N N  Housekeeping or managing your Housekeeping? N N    Patient Care  Team: Plotnikov, Karlynn GAILS, MD as PCP -  Diedre Gerome Charleston, MD (Inactive) (Orthopedic Surgery) Debrah Ade, MD (Obstetrics and Gynecology) Maurilio Camellia PARAS, MD as Consulting Physician (Allergy  and Immunology) Ladora, My [place], [place] as Referring Physician (Optometry)  I have updated your Care Teams any recent Medical Services you may have received from other providers in the past year.     Assessment:   This is a routine wellness examination for [place].  Hearing/Vision screen Hearing Screening - Comments:: Denies hearing difficulties   Vision Screening - Comments:: Wears rx glasses - up to date with routine eye exams with [facility]   Goals Addressed               This Visit's Progress     Patient Stated (pt-stated)        Patient stated she plans to continue to exercise at the [facility]       Depression Screen     06/13/2024    8:16 AM 11/02/2023    8:23 AM 08/02/2023    8:06 AM 06/11/2023    8:26 AM 05/03/2023    8:22 AM 01/29/2023    8:29 AM 10/20/2022    7:53 AM  PHQ 2/9 Scores  PHQ - 2 Score 0 0 0 0 0 0 1  PHQ- 9 Score 0   2       Fall Risk     06/13/2024    8:16 AM 06/06/2024   10:44 AM 11/02/2023    8:23 AM 08/02/2023    8:06 AM 06/07/2023    9:25 PM  Fall Risk   Falls in the past year? 0 0 0 0 0  Number falls in past yr: 0  0 0   Injury with Fall? 0  0 0   Risk for fall due to : No Fall Risks  No Fall Risks No Fall Risks   Follow up Falls evaluation completed;Falls prevention discussed  Falls evaluation completed Falls evaluation completed Falls evaluation completed;Falls prevention discussed    MEDICARE RISK AT HOME:  Medicare Risk at Home Any stairs in or around the home?: No Home free of loose throw rugs in walkways, pet beds, electrical cords, etc?: Yes Adequate lighting in your home to reduce risk of falls?: Yes Life alert?: No Use of a cane, walker or w/c?: No Grab bars in the bathroom?: No Shower chair or bench in shower?: Yes Elevated  toilet seat or a handicapped toilet?: No  TIMED UP AND GO:  Was the test performed?  No  Cognitive Function: 6CIT completed        06/13/2024    8:18 AM 06/11/2023    8:24 AM 06/08/2022    8:27 AM  6CIT Screen  What Year? 0 points 0 points 0 points  What month? 0 points 0 points 0 points  What time? 0 points 0 points 0 points  Count back from 20 0 points 0 points 0 points  Months in reverse 0 points 4 points 0 points  Repeat phrase 0 points 0 points 0 points  Total Score 0 points 4 points 0 points    Immunizations Immunization History  Administered Date(s) Administered   PFIZER Comirnaty(Gray Top)Covid-19 Tri-Sucrose Vaccine 11/04/2019, 11/25/2019, 07/19/2020   PFIZER(Purple Top)SARS-COV-2 Vaccination 03/04/2021   Pfizer Covid-19 Vaccine Bivalent Booster 16yrs & up 06/03/2021   Pfizer(Comirnaty)Fall Seasonal Vaccine 12 years and older 06/19/2022, 06/19/2023   Pneumococcal Conjugate-13 07/13/2019   Pneumococcal Polysaccharide-23 09/16/2020   Tdap 03/20/2015   Zoster Recombinant(Shingrix) 03/12/2022, 06/19/2022    Screening Tests Health  Maintenance  Topic Date Due   Diabetic kidney evaluation - Urine ACR  Never done   FOOT EXAM  09/16/2021   HEMOGLOBIN A1C  12/04/2024   OPHTHALMOLOGY EXAM  12/26/2024   DTaP/Tdap/Td (2 - Td or Tdap) 03/19/2025   Mammogram  03/27/2025   Diabetic kidney evaluation - eGFR measurement  06/05/2025   Medicare Annual Wellness (AWV)  06/13/2025   Colonoscopy  06/29/2026   Pneumococcal Vaccine: 50+ Years  Completed   DEXA SCAN  Completed   Hepatitis C Screening  Completed   Meningococcal B Vaccine  Aged Out   Influenza Vaccine  Discontinued   COVID-19 Vaccine  Discontinued   Zoster Vaccines- Shingrix  Discontinued    Health Maintenance Items Addressed:  Labs Due Diabetic Kidney Urine ACR  Additional Screening:  Vision Screening: Recommended annual ophthalmology exams for early detection of glaucoma and other disorders of the  eye. Is the patient up to date with their annual eye exam?  Yes  Who is the provider or what is the name of the office in which the patient attends annual eye exams? [facility]  Dental Screening: Recommended annual dental exams for proper oral hygiene  Community Resource Referral / Chronic Care Management: CRR required this visit?  No   CCM required this visit?  No   Plan:    I have personally reviewed and noted the following in the patient's chart:   Medical and social history Use of alcohol, tobacco or illicit drugs  Current medications and supplements including opioid prescriptions. Patient is not currently taking opioid prescriptions. Functional ability and status Nutritional status Physical activity Advanced directives List of other physicians Hospitalizations, surgeries, and ER visits in previous 12 months Vitals Screenings to include cognitive, depression, and falls Referrals and appointments  In addition, I have reviewed and discussed with patient certain preventive protocols, quality metrics, and best practice recommendations. A written personalized care plan for preventive services as well as general preventive health recommendations were provided to patient.   Verdie CHRISTELLA Saba, CMA   06/13/2024   After Visit Summary: (MyChart) Due to this being a telephonic visit, the after visit summary with patients personalized plan was offered to patient via MyChart   Notes: Nothing significant to report at this time.  Medical screening examination/treatment/procedure(s) were performed by non-physician practitioner and as supervising physician I was immediately available for consultation/collaboration.  I agree with above. Karlynn Noel, MD

## 2024-06-14 ENCOUNTER — Ambulatory Visit: Admitting: *Deleted

## 2024-06-14 ENCOUNTER — Other Ambulatory Visit

## 2024-06-14 DIAGNOSIS — L209 Atopic dermatitis, unspecified: Secondary | ICD-10-CM | POA: Diagnosis not present

## 2024-06-14 DIAGNOSIS — E119 Type 2 diabetes mellitus without complications: Secondary | ICD-10-CM

## 2024-06-14 LAB — MICROALBUMIN / CREATININE URINE RATIO
Creatinine,U: 48.8 mg/dL
Microalb Creat Ratio: UNDETERMINED mg/g (ref 0.0–30.0)
Microalb, Ur: <0.7 mg/dL

## 2024-06-28 ENCOUNTER — Ambulatory Visit (INDEPENDENT_AMBULATORY_CARE_PROVIDER_SITE_OTHER)

## 2024-06-28 DIAGNOSIS — L209 Atopic dermatitis, unspecified: Secondary | ICD-10-CM

## 2024-07-12 ENCOUNTER — Ambulatory Visit

## 2024-07-12 DIAGNOSIS — L209 Atopic dermatitis, unspecified: Secondary | ICD-10-CM | POA: Diagnosis not present

## 2024-07-17 ENCOUNTER — Other Ambulatory Visit: Payer: Self-pay | Admitting: Internal Medicine

## 2024-07-19 ENCOUNTER — Other Ambulatory Visit: Payer: Self-pay | Admitting: Internal Medicine

## 2024-07-27 ENCOUNTER — Telehealth: Payer: Self-pay | Admitting: *Deleted

## 2024-07-27 ENCOUNTER — Ambulatory Visit

## 2024-07-27 DIAGNOSIS — L209 Atopic dermatitis, unspecified: Secondary | ICD-10-CM | POA: Diagnosis not present

## 2024-07-27 DIAGNOSIS — L281 Prurigo nodularis: Secondary | ICD-10-CM

## 2024-07-27 MED ORDER — DUPIXENT 300 MG/2ML ~~LOC~~ SOSY: 300.0000 mg | PREFILLED_SYRINGE | SUBCUTANEOUS | 3 refills | Status: AC

## 2024-07-27 NOTE — Telephone Encounter (Signed)
 Rx sent and patient advised of same

## 2024-07-27 NOTE — Telephone Encounter (Signed)
 Patient called and said that the pharmacy was going to reach out in regards to needing a new prescription for her Dupixent .

## 2024-07-27 NOTE — Addendum Note (Signed)
 Addended by: OTHA MADELIN HERO on: 07/27/2024 04:56 PM   Modules accepted: Orders

## 2024-08-10 ENCOUNTER — Ambulatory Visit

## 2024-08-10 DIAGNOSIS — L209 Atopic dermatitis, unspecified: Secondary | ICD-10-CM | POA: Diagnosis not present

## 2024-08-16 ENCOUNTER — Other Ambulatory Visit: Payer: Self-pay | Admitting: Internal Medicine

## 2024-08-30 ENCOUNTER — Ambulatory Visit

## 2024-08-30 DIAGNOSIS — L209 Atopic dermatitis, unspecified: Secondary | ICD-10-CM

## 2024-09-05 ENCOUNTER — Encounter: Payer: Self-pay | Admitting: Internal Medicine

## 2024-09-05 ENCOUNTER — Ambulatory Visit: Admitting: Internal Medicine

## 2024-09-05 ENCOUNTER — Ambulatory Visit (INDEPENDENT_AMBULATORY_CARE_PROVIDER_SITE_OTHER)

## 2024-09-05 VITALS — BP 140/80 | HR 77 | Ht <= 58 in | Wt 128.8 lb

## 2024-09-05 DIAGNOSIS — M5442 Lumbago with sciatica, left side: Secondary | ICD-10-CM

## 2024-09-05 DIAGNOSIS — M25552 Pain in left hip: Secondary | ICD-10-CM | POA: Diagnosis not present

## 2024-09-05 DIAGNOSIS — E119 Type 2 diabetes mellitus without complications: Secondary | ICD-10-CM | POA: Diagnosis not present

## 2024-09-05 DIAGNOSIS — G8929 Other chronic pain: Secondary | ICD-10-CM

## 2024-09-05 DIAGNOSIS — Z7985 Long-term (current) use of injectable non-insulin antidiabetic drugs: Secondary | ICD-10-CM

## 2024-09-05 LAB — COMPREHENSIVE METABOLIC PANEL WITH GFR
ALT: 12 U/L (ref 3–35)
AST: 14 U/L (ref 5–37)
Albumin: 4.6 g/dL (ref 3.5–5.2)
Alkaline Phosphatase: 93 U/L (ref 39–117)
BUN: 12 mg/dL (ref 6–23)
CO2: 28 meq/L (ref 19–32)
Calcium: 10 mg/dL (ref 8.4–10.5)
Chloride: 100 meq/L (ref 96–112)
Creatinine, Ser: 0.94 mg/dL (ref 0.40–1.20)
GFR: 61.68 mL/min
Glucose, Bld: 99 mg/dL (ref 70–99)
Potassium: 4.2 meq/L (ref 3.5–5.1)
Sodium: 137 meq/L (ref 135–145)
Total Bilirubin: 0.3 mg/dL (ref 0.2–1.2)
Total Protein: 8.2 g/dL (ref 6.0–8.3)

## 2024-09-05 LAB — HEMOGLOBIN A1C: Hgb A1c MFr Bld: 6.2 % (ref 4.6–6.5)

## 2024-09-05 MED ORDER — REPAGLINIDE 1 MG PO TABS: ORAL_TABLET | ORAL | 3 refills | Status: AC

## 2024-09-05 NOTE — Assessment & Plan Note (Signed)
 X ray LS and L hip Sports med/Ortho ref and OP PT offered Cont w/Meloxicam 

## 2024-09-05 NOTE — Assessment & Plan Note (Signed)
 On Mounjaro  Doing well on 5 mg/wk Check labs

## 2024-09-05 NOTE — Progress Notes (Signed)
 "  Subjective:  Patient ID: Renee Mann, female    DOB: 03-Jun-1956  Age: 69 y.o. MRN: [id number]  CC: Medical Management of Chronic Issues (3 Month follow up. Ongoing left sided hip/flank pain, especially when walking or laying on that side. Currently treating with meloxicam . Past surgery on the left hip)   HPI Renee Mann presents for a f/u on DM, L hip pain, HTN L LBP shoots down the L leg. Meloxicam  helps a lot  Outpatient Medications Prior to Visit  Medication Sig Dispense Refill   amLODipine  (NORVASC ) 5 MG tablet TAKE 1 TABLET BY MOUTH EVERY DAY 90 tablet 3   Blood Glucose Monitoring Suppl DEVI 1 each by Does not apply route in the morning, at noon, and at bedtime. May substitute to any manufacturer covered by patient's insurance ([employer] [facility]). Dx code: E11.9 1 each 0   Cholecalciferol (VITAMIN D3) 50 MCG (2000 UT) capsule Take 1 capsule (2,000 Units total) by mouth daily. (Patient taking differently: Take 5,000 Units by mouth daily.) 100 capsule 3   clotrimazole  (LOTRIMIN ) 1 % cream APPLY 1 APPLICATION TOPICALLY IN THE MORNING AND AT BEDTIME. USE UNTIL RASH DISAPPEARS AND THEN ANOTHER 3 DAYS. (Patient taking differently: Apply 1 Application topically daily as needed (Itching).) 30 g 3   cyclobenzaprine  (FLEXERIL ) 5 MG tablet Take 1 tablet (5 mg total) by mouth at bedtime. 30 tablet 2   diphenhydrAMINE  (BENADRYL ) 25 MG tablet Take 25 mg by mouth every 6 (six) hours as needed for allergies.     dupilumab  (DUPIXENT ) 300 MG/2ML prefilled syringe Inject 300 mg into the skin every 14 (fourteen) days. 12 mL 3   gabapentin  (NEURONTIN ) 300 MG capsule Take 300 mg by mouth 3 (three) times daily.     hydrocortisone  2.5 % ointment Apply topically twice daily as need to red sandpapery rash. 30 g 3   hyoscyamine  (LEVSIN  SL) 0.125 MG SL tablet Place 1 tablet (0.125 mg total) under the tongue every 6 (six) hours as needed for cramping (nausea, AB pain). 30 tablet 0   ketoconazole  (NIZORAL ) 2 % cream Apply  1 Application topically daily as needed (rash). 30 g 0   levocetirizine (XYZAL ) 5 MG tablet Take 1 tablet by mouth 2 (two) times daily.     meloxicam  (MOBIC ) 15 MG tablet TAKE 1 TABLET BY MOUTH ONCE DAILY AS NEEDED FOR PAIN 30 tablet 2   metFORMIN  (GLUCOPHAGE -XR) 750 MG 24 hr tablet Take 1 tablet by mouth once daily with breakfast 90 tablet 0   OneTouch UltraSoft 2 Lancets MISC 1 each by Does not apply route in the morning, at noon, and at bedtime. Use 3x a day to check blood sugars daily. DX CODE: E11.9  (ONE TOUCH DELICA) 150 each 8   OVER THE COUNTER MEDICATION Take 1 tablet by mouth every 6 (six) hours as needed (Feeling in stomach). Altoids     pantoprazole  (PROTONIX ) 40 MG tablet Take 1 tablet by mouth twice daily 180 tablet 0   tirzepatide  (MOUNJARO ) 5 MG/0.5ML Pen INJECT 5 MG INTO THE SKIN ONCE A WEEK 2 mL 5   triamcinolone  cream (KENALOG ) 0.1 % APPLY TO AFFECTED AREA TWICE A DAY 30 g 5   repaglinide  (PRANDIN ) 1 MG tablet TAKE 1 TABLET BY MOUTH 3 TIMES DAILY BEFORE MEAL(S) 270 tablet 3   traZODone  (DESYREL ) 50 MG tablet Take 0.5-1 tablets (25-50 mg total) by mouth at bedtime as needed for sleep. (Patient not taking: Reported on 09/05/2024) 30 tablet 5  Facility-Administered Medications Prior to Visit  Medication Dose Route Frequency Provider Last Rate Last Admin   dupilumab  (DUPIXENT ) prefilled syringe 300 mg  300 mg Subcutaneous Q14 Days Marinda Rocky SAILOR, MD   300 mg at 08/30/24 1005    ROS: Review of Systems  Constitutional:  Negative for activity change, appetite change, chills, fatigue and unexpected weight change.  HENT:  Negative for congestion, mouth sores and sinus pressure.   Eyes:  Negative for visual disturbance.  Respiratory:  Negative for cough and chest tightness.   Gastrointestinal:  Negative for abdominal pain and nausea.  Genitourinary:  Negative for difficulty urinating, frequency and vaginal pain.  Musculoskeletal:  Positive for back pain and gait problem.  Skin:   Negative for pallor and rash.  Neurological:  Negative for dizziness, tremors, weakness, numbness and headaches.  Hematological:  Does not bruise/bleed easily.  Psychiatric/Behavioral:  Negative for confusion and sleep disturbance.     Objective:  BP (!) 140/80   Pulse 77   Ht 4' 10 (1.473 m)   Wt 128 lb 12.8 oz (58.4 kg)   SpO2 99%   BMI 26.92 kg/m   BP Readings from Last 3 Encounters:  09/05/24 (!) 140/80  06/05/24 122/82  02/02/24 120/70    Wt Readings from Last 3 Encounters:  09/05/24 128 lb 12.8 oz (58.4 kg)  06/13/24 133 lb (60.3 kg)  06/05/24 133 lb 3.2 oz (60.4 kg)    Physical Exam Constitutional:      General: She is not in acute distress.    Appearance: She is well-developed. She is obese.  HENT:     Head: Normocephalic.     Right Ear: External ear normal.     Left Ear: External ear normal.     Nose: Nose normal.  Eyes:     General:        Right eye: No discharge.        Left eye: No discharge.     Conjunctiva/sclera: Conjunctivae normal.     Pupils: Pupils are equal, round, and reactive to light.  Neck:     Thyroid : No thyromegaly.     Vascular: No JVD.     Trachea: No tracheal deviation.  Cardiovascular:     Rate and Rhythm: Normal rate and regular rhythm.     Heart sounds: Normal heart sounds.  Pulmonary:     Effort: No respiratory distress.     Breath sounds: No stridor. No wheezing.  Abdominal:     General: Bowel sounds are normal. There is no distension.     Palpations: Abdomen is soft. There is no mass.     Tenderness: There is no abdominal tenderness. There is no guarding or rebound.  Musculoskeletal:        General: No tenderness.     Cervical back: Normal range of motion and neck supple. No rigidity.  Lymphadenopathy:     Cervical: No cervical adenopathy.  Skin:    Findings: No erythema or rash.  Neurological:     Cranial Nerves: No cranial nerve deficit.     Motor: No abnormal muscle tone.     Coordination: Coordination normal.      Deep Tendon Reflexes: Reflexes normal.  Psychiatric:        Behavior: Behavior normal.        Thought Content: Thought content normal.        Judgment: Judgment normal.   L lat hip is very painful on palpation  ROM is ok Str leg elev  is neg on the R  and +/- on the L DTRs ok  Lab Results  Component Value Date   WBC 6.2 05/03/2023   HGB 13.0 05/03/2023   HCT 40.2 05/03/2023   PLT 442.0 (H) 05/03/2023   GLUCOSE 55 (L) 06/05/2024   CHOL 170 05/03/2023   TRIG 69.0 05/03/2023   HDL 46.10 05/03/2023   LDLCALC 110 (H) 05/03/2023   ALT 12 02/02/2024   AST 13 02/02/2024   NA 139 06/05/2024   K 3.9 06/05/2024   CL 102 06/05/2024   CREATININE 0.99 06/05/2024   BUN 10 06/05/2024   CO2 27 06/05/2024   TSH 1.91 05/03/2023   HGBA1C 6.3 06/05/2024   MICROALBUR <0.7 06/14/2024    MM 3D DIAGNOSTIC MAMMOGRAM UNILATERAL RIGHT BREAST Result Date: 03/27/2024 CLINICAL DATA:  69 year old female presenting for short-term follow-up of a probably benign right breast mass. EXAM: DIGITAL DIAGNOSTIC UNILATERAL RIGHT MAMMOGRAM WITH TOMOSYNTHESIS AND CAD TECHNIQUE: Right digital diagnostic mammography and breast tomosynthesis was performed. The images were evaluated with computer-aided detection. COMPARISON:  Previous exam(s). ACR Breast Density Category b: There are scattered areas of fibroglandular density. FINDINGS: Full field tomosynthesis views of the right breast were performed. There is a stable small obscured mass in the central inferior right breast best seen on CC view. No new suspicious findings elsewhere in the right breast. IMPRESSION: Stable probably benign mass in the central inferior right breast. RECOMMENDATION: Diagnostic bilateral mammogram and possible right breast ultrasound in 6 months. I have discussed the findings and recommendations with the patient. If applicable, a reminder letter will be sent to the patient regarding the next appointment. BI-RADS CATEGORY  3: Probably benign.  Electronically Signed   By: Inocente Ast M.D.   On: 03/27/2024 11:06    Assessment & Plan:   Problem List Items Addressed This Visit     Type 2 diabetes mellitus (HCC)   On Mounjaro  Doing well on 5 mg/wk Check labs      Relevant Medications   repaglinide  (PRANDIN ) 1 MG tablet   Other Relevant Orders   Comprehensive metabolic panel with GFR   Hemoglobin A1c   Hip pain - Primary   X ray LS and L hip Sports med/Ortho ref and OP PT offered Cont w/Meloxicam       Relevant Orders   XR HIP UNILAT W OR W/O PELVIS 2-3 VIEWS LEFT   DG Lumbar Spine 2-3 Views   Other Visit Diagnoses       Chronic left-sided low back pain with left-sided sciatica       Relevant Orders   DG Lumbar Spine 2-3 Views         Meds ordered this encounter  Medications   repaglinide  (PRANDIN ) 1 MG tablet    Sig: TAKE 1 TABLET BY MOUTH 3 TIMES DAILY BEFORE MEAL(S)    Dispense:  270 tablet    Refill:  3      Follow-up: Return in about 3 months (around 12/04/2024) for a follow-up visit.  Marolyn Noel, MD "

## 2024-09-08 ENCOUNTER — Other Ambulatory Visit: Payer: Self-pay | Admitting: Obstetrics and Gynecology

## 2024-09-08 DIAGNOSIS — N6310 Unspecified lump in the right breast, unspecified quadrant: Secondary | ICD-10-CM

## 2024-09-10 ENCOUNTER — Ambulatory Visit: Payer: Self-pay | Admitting: Internal Medicine

## 2024-09-12 ENCOUNTER — Telehealth: Payer: Self-pay

## 2024-09-12 DIAGNOSIS — E119 Type 2 diabetes mellitus without complications: Secondary | ICD-10-CM

## 2024-09-12 MED ORDER — ACCU-CHEK GUIDE W/DEVICE KIT: PACK | 0 refills | Status: AC

## 2024-09-12 NOTE — Telephone Encounter (Signed)
 Copied from CRM [id number]. Topic: Clinical - Prescription Issue >> Sep 12, 2024  8:18 AM Donna BRAVO wrote: Reason for CRM: Patient would like a prescription written for Accu-chek kit.  Insurance will pay for this kit and not the OneTouch  Please send prescription for Accu-chek kit. To  [facility] [address], [place] - [address] [place] [address] Phone: [phone] Fax: [phone]

## 2024-09-12 NOTE — Addendum Note (Signed)
 Addended by: HEDDY IP R on: 09/12/2024 03:28 PM   Modules accepted: Orders

## 2024-09-13 ENCOUNTER — Ambulatory Visit

## 2024-09-13 DIAGNOSIS — L209 Atopic dermatitis, unspecified: Secondary | ICD-10-CM | POA: Diagnosis not present

## 2024-09-14 ENCOUNTER — Telehealth: Payer: Self-pay

## 2024-09-14 ENCOUNTER — Other Ambulatory Visit: Payer: Self-pay | Admitting: Internal Medicine

## 2024-09-14 DIAGNOSIS — E119 Type 2 diabetes mellitus without complications: Secondary | ICD-10-CM

## 2024-09-14 MED ORDER — ACCU-CHEK SOFTCLIX LANCETS MISC: 12 refills | Status: AC

## 2024-09-14 MED ORDER — GLUCOSE BLOOD VI STRP: ORAL_STRIP | 12 refills | Status: AC

## 2024-09-14 NOTE — Telephone Encounter (Signed)
 This has been ordered and sent to pts pharmacy.

## 2024-09-14 NOTE — Telephone Encounter (Signed)
 This has been sent over to pts pharmacy.

## 2024-09-14 NOTE — Addendum Note (Signed)
 Addended by: HEDDY IP R on: 09/14/2024 02:15 PM   Modules accepted: Orders

## 2024-09-14 NOTE — Telephone Encounter (Unsigned)
 Copied from CRM [id number]. Topic: Clinical - Prescription Issue >> Sep 12, 2024  8:18 AM Donna BRAVO wrote: Reason for CRM: Patient would like a prescription written for Accu-chek kit.  Insurance will pay for this kit and not the OneTouch  Please send prescription for Accu-chek kit. To  [facility] [address], [place] - [address] [place] [address] Phone: [phone] Fax: [phone] >> Sep 14, 2024  1:58 PM Rea ORN wrote: Pt called back to advise that [facility] does not have the lancets or test strip prescription. Pt picked up the Accu Check kit yesterday but it did not include everything she needed. [facility] told pt to call the clinic to request an rx for  Accu Chek Test Strips and Accu Check Lancets. Please call pt back to advise when the new rx will be sent, [phone]  >> Sep 14, 2024  8:03 AM Macario HERO wrote: Patient stated the pharmacy said she needs a prescription for Accu-chek  needles and strips.

## 2024-09-14 NOTE — Telephone Encounter (Signed)
 Copied from CRM [id number]. Topic: Clinical - Prescription Issue >> Sep 12, 2024  8:18 AM Donna BRAVO wrote: Reason for CRM: Patient would like a prescription written for Accu-chek kit.  Insurance will pay for this kit and not the OneTouch  Please send prescription for Accu-chek kit. To  [facility] [address], [place] - [address] [place] [address] Phone: [phone] Fax: [phone] >> Sep 14, 2024  8:03 AM Macario HERO wrote: Patient stated the pharmacy said she needs a prescription for Accu-chek  needles and strips.

## 2024-09-25 ENCOUNTER — Ambulatory Visit: Admitting: Internal Medicine

## 2024-09-25 ENCOUNTER — Ambulatory Visit

## 2024-09-27 ENCOUNTER — Ambulatory Visit

## 2024-10-03 ENCOUNTER — Other Ambulatory Visit

## 2024-10-03 ENCOUNTER — Encounter

## 2024-12-05 ENCOUNTER — Ambulatory Visit: Admitting: Internal Medicine

## 2025-06-18 ENCOUNTER — Encounter: Admitting: Internal Medicine

## 2025-06-18 ENCOUNTER — Ambulatory Visit
